# Patient Record
Sex: Male | Born: 1965 | Race: Black or African American | Hispanic: No | Marital: Single | Smoking: Never smoker
Health system: Southern US, Community
[De-identification: ages and names within clinical notes are randomized; demographics above are authoritative.]

## PROBLEM LIST (undated history)

## (undated) DIAGNOSIS — K219 Gastro-esophageal reflux disease without esophagitis: Secondary | ICD-10-CM

## (undated) DIAGNOSIS — E039 Hypothyroidism, unspecified: Secondary | ICD-10-CM

## (undated) DIAGNOSIS — S83209A Unspecified tear of unspecified meniscus, current injury, unspecified knee, initial encounter: Secondary | ICD-10-CM

## (undated) DIAGNOSIS — G4730 Sleep apnea, unspecified: Secondary | ICD-10-CM

## (undated) DIAGNOSIS — R609 Edema, unspecified: Secondary | ICD-10-CM

## (undated) DIAGNOSIS — M62838 Other muscle spasm: Secondary | ICD-10-CM

## (undated) DIAGNOSIS — R600 Localized edema: Secondary | ICD-10-CM

## (undated) DIAGNOSIS — E119 Type 2 diabetes mellitus without complications: Secondary | ICD-10-CM

## (undated) DIAGNOSIS — I499 Cardiac arrhythmia, unspecified: Secondary | ICD-10-CM

## (undated) DIAGNOSIS — H269 Unspecified cataract: Secondary | ICD-10-CM

## (undated) DIAGNOSIS — I10 Essential (primary) hypertension: Secondary | ICD-10-CM

## (undated) DIAGNOSIS — M1990 Unspecified osteoarthritis, unspecified site: Secondary | ICD-10-CM

## (undated) DIAGNOSIS — M722 Plantar fascial fibromatosis: Secondary | ICD-10-CM

## (undated) DIAGNOSIS — R0600 Dyspnea, unspecified: Secondary | ICD-10-CM

## (undated) DIAGNOSIS — I509 Heart failure, unspecified: Secondary | ICD-10-CM

## (undated) DIAGNOSIS — I5030 Unspecified diastolic (congestive) heart failure: Secondary | ICD-10-CM

## (undated) HISTORY — DX: Unspecified cataract: H26.9

## (undated) HISTORY — DX: Sleep apnea, unspecified: G47.30

## (undated) HISTORY — PX: OTHER SURGICAL HISTORY: SHX169

## (undated) HISTORY — PX: ESOPHAGOGASTRODUODENOSCOPY: SHX1529

## (undated) HISTORY — DX: Unspecified tear of unspecified meniscus, current injury, unspecified knee, initial encounter: S83.209A

## (undated) HISTORY — DX: Heart failure, unspecified: I50.9

---

## 2001-12-02 HISTORY — PX: OTHER SURGICAL HISTORY: SHX169

## 2012-09-22 ENCOUNTER — Other Ambulatory Visit (HOSPITAL_COMMUNITY)
Admission: RE | Admit: 2012-09-22 | Discharge: 2012-09-22 | Disposition: A | Discharge status: Home / Self Care | Payer: BC Managed Care – PPO | Source: Ambulatory Visit | Attending: Otolaryngology | Admitting: Otolaryngology

## 2012-09-22 DIAGNOSIS — H938X9 Other specified disorders of ear, unspecified ear: Secondary | ICD-10-CM | POA: Insufficient documentation

## 2012-09-23 ENCOUNTER — Other Ambulatory Visit: Payer: Self-pay | Admitting: Otolaryngology

## 2012-09-23 DIAGNOSIS — R1310 Dysphagia, unspecified: Secondary | ICD-10-CM

## 2012-09-23 DIAGNOSIS — K219 Gastro-esophageal reflux disease without esophagitis: Secondary | ICD-10-CM

## 2013-04-06 ENCOUNTER — Ambulatory Visit (INDEPENDENT_AMBULATORY_CARE_PROVIDER_SITE_OTHER): Payer: BC Managed Care – PPO | Admitting: Podiatry

## 2013-04-06 ENCOUNTER — Encounter: Payer: Self-pay | Admitting: Podiatry

## 2013-04-06 VITALS — BP 171/100 | HR 95 | Ht 71.0 in | Wt 251.0 lb

## 2013-04-06 DIAGNOSIS — M216X9 Other acquired deformities of unspecified foot: Secondary | ICD-10-CM

## 2013-04-06 DIAGNOSIS — M722 Plantar fascial fibromatosis: Secondary | ICD-10-CM

## 2013-04-06 DIAGNOSIS — M21969 Unspecified acquired deformity of unspecified lower leg: Secondary | ICD-10-CM

## 2013-04-06 DIAGNOSIS — M216X1 Other acquired deformities of right foot: Secondary | ICD-10-CM

## 2013-04-06 MED ORDER — HYDROCODONE-ACETAMINOPHEN 7.5-300 MG PO TABS: 1.0000 | ORAL_TABLET | Freq: Three times a day (TID) | ORAL | Status: DC | PRN

## 2013-04-06 MED ORDER — NABUMETONE 500 MG PO TABS: 500.0000 mg | ORAL_TABLET | Freq: Two times a day (BID) | ORAL | Status: DC

## 2013-04-06 NOTE — Progress Notes (Signed)
SUBJECTIVE: 47 y.o. year old male presents complaining of Right heel pain x 2 weeks. Hurts the most especially in AM or after been off, and getting up. Has tried Ibuprofen that did not help. Has tried OTC Orthotics that is helping.  REVIEW OF SYSTEMS: Constitutional: negative for anorexia, chills, fatigue, fevers, night sweats and weight loss Eyes: negative Ears, nose, mouth, throat, and face: negative Respiratory: negative Cardiovascular: negative Gastrointestinal: negative Genitourinary:negative Hematologic/lymphatic: negative Musculoskeletal:negative, Left knee arthroscopic surgery 10 years ago and still have swelling in lower limb. Had hairline fracture on left ankle.  Neurological: negative Allergic/Immunologic: Allergic to Pollens and sinus problem.  OBJECTIVE: DERMATOLOGIC EXAMINATION: Within normal. VASCULAR EXAMINATION OF LOWER LIMBS: Pedal pulses: All pedal pulses are palpable with normal pulsation.  Capillary Filling times within 3 seconds in all digits.  No visible edema at affected area. Mild increase in temperature on right rearfoot area.   NEUROLOGIC EXAMINATION OF THE LOWER LIMBS: All epicritic and tactile sensations grossly intact.   MUSCULOSKELETAL EXAMINATION: Tight Achilles tendon right with knee extension and flexion, normal finding on left side. Forefoot varus bilaereal.   RADIOGRAPHIC STUDIES:  Significant for short first metatarsal bone bilateral. Positive of increased lateral deviation calcaneocuboid angle. Positive of bone spur at the head of Talus just distal to ankle joint right foot.  ASSESSMENT: 1. Plantar fasciitis right. 2. Ankle equinus right. 3. Short first ray bilateral.  PLAN: Offered Night Splint and Cortisone injections; patient refused both. Should benefit from custom made Orthotics. Patient will return after finding out insurance benefits. Patient was instructed Achilles tendon stretch exercise.  Rx. Relafen 500 bid and Vicodin  7.5/300 q 8 hr prn.

## 2013-06-23 ENCOUNTER — Other Ambulatory Visit (HOSPITAL_COMMUNITY): Payer: Self-pay | Admitting: Otolaryngology

## 2013-07-29 ENCOUNTER — Encounter (HOSPITAL_COMMUNITY)
Admission: RE | Admit: 2013-07-29 | Discharge: 2013-07-29 | Disposition: A | Discharge status: Home / Self Care | Payer: BC Managed Care – PPO | Source: Ambulatory Visit | Attending: Otolaryngology | Admitting: Otolaryngology

## 2013-07-29 ENCOUNTER — Encounter (HOSPITAL_COMMUNITY): Payer: Self-pay

## 2013-07-29 ENCOUNTER — Encounter (HOSPITAL_COMMUNITY)
Admission: RE | Admit: 2013-07-29 | Discharge: 2013-07-29 | Disposition: A | Discharge status: Home / Self Care | Payer: BC Managed Care – PPO | Source: Ambulatory Visit | Attending: Anesthesiology | Admitting: Anesthesiology

## 2013-07-29 DIAGNOSIS — Z01812 Encounter for preprocedural laboratory examination: Secondary | ICD-10-CM | POA: Insufficient documentation

## 2013-07-29 DIAGNOSIS — Z01818 Encounter for other preprocedural examination: Secondary | ICD-10-CM | POA: Insufficient documentation

## 2013-07-29 HISTORY — DX: Other muscle spasm: M62.838

## 2013-07-29 HISTORY — DX: Edema, unspecified: R60.9

## 2013-07-29 HISTORY — DX: Essential (primary) hypertension: I10

## 2013-07-29 HISTORY — DX: Gastro-esophageal reflux disease without esophagitis: K21.9

## 2013-07-29 HISTORY — DX: Plantar fascial fibromatosis: M72.2

## 2013-07-29 LAB — CBC
HCT: 47.1 % (ref 39.0–52.0)
MCV: 74.1 fL — ABNORMAL LOW (ref 78.0–100.0)
RBC: 6.36 MIL/uL — ABNORMAL HIGH (ref 4.22–5.81)
WBC: 7.9 10*3/uL (ref 4.0–10.5)

## 2013-07-29 LAB — BASIC METABOLIC PANEL
CO2: 26 mEq/L (ref 19–32)
Chloride: 101 mEq/L (ref 96–112)
Creatinine, Ser: 1 mg/dL (ref 0.50–1.35)
Sodium: 140 mEq/L (ref 135–145)

## 2013-07-29 NOTE — Progress Notes (Signed)
Pt doesn't have a cardiologist  Echo/Stress test done at [facility] request   Medical Md is Dr.Mark Arvind  EKG to be requested from [facility]  Denies CXR in past yr

## 2013-07-29 NOTE — Pre-Procedure Instructions (Signed)
Parker [name]  07/29/2013   Your procedure is scheduled on:  Fri, Sept 5 @ 7:30 AM  Report to [facility] at 5:30 AM.  Call this number if you have problems the morning of surgery: [phone]   Remember:   Do not eat food or drink liquids after midnight.   Take these medicines the morning of surgery with A SIP OF WATER: Pain Pill(if needed)                Stop taking your Relafen.No Goody's,BC's,Aleve,Ibuprofen,Aspirin,Fish Oil,or any Herbal Medications   Do not wear jewelry  Do not wear lotions, powders, or colognes. You may wear deodorant.  Men may shave face and neck.  Do not bring valuables to the hospital.  [facility] is not responsible                   for any belongings or valuables.  Contacts, dentures or bridgework may not be worn into surgery.  Leave suitcase in the car. After surgery it may be brought to your room.  For patients admitted to the hospital, checkout time is 11:00 AM the day of  discharge.   Patients discharged the day of surgery will not be allowed to drive  home.    Special Instructions: Shower using CHG 2 nights before surgery and the night before surgery.  If you shower the day of surgery use CHG.  Use special wash - you have one bottle of CHG for all showers.  You should use approximately 1/3 of the bottle for each shower.   Please read over the following fact sheets that you were given: Pain Booklet, Coughing and Deep Breathing and Surgical Site Infection Prevention

## 2013-07-29 NOTE — Progress Notes (Signed)
07/29/13 1533  OBSTRUCTIVE SLEEP APNEA  Have you ever been diagnosed with sleep apnea through a sleep study? No  Do you snore loudly (loud enough to be heard through closed doors)?  0  Do you often feel tired, fatigued, or sleepy during the daytime? 0  Has anyone observed you stop breathing during your sleep? 1  Do you have, or are you being treated for high blood pressure? 1  BMI more than 35 kg/m2? 0  Age over 48 years old? 0  Neck circumference greater than 40 cm/18 inches? 1  Gender: 1 (18)  Obstructive Sleep Apnea Score 4  Score 4 or greater  Results sent to PCP

## 2013-08-03 NOTE — Progress Notes (Signed)
Spoke with medical records at [facility] center and re-requested EKG, OV note, Echo and stress test.  They stated they will fax today.

## 2013-08-06 ENCOUNTER — Encounter (HOSPITAL_COMMUNITY)
Admission: RE | Disposition: A | Discharge status: Home / Self Care | Payer: Self-pay | Source: Ambulatory Visit | Attending: Otolaryngology

## 2013-08-06 ENCOUNTER — Observation Stay (HOSPITAL_COMMUNITY)
Admission: RE | Admit: 2013-08-06 | Discharge: 2013-08-07 | Disposition: A | Discharge status: Home / Self Care | Payer: BC Managed Care – PPO | Source: Ambulatory Visit | Attending: Otolaryngology | Admitting: Otolaryngology

## 2013-08-06 ENCOUNTER — Encounter (HOSPITAL_COMMUNITY): Payer: Self-pay | Admitting: Anesthesiology

## 2013-08-06 ENCOUNTER — Encounter (HOSPITAL_COMMUNITY): Payer: Self-pay | Admitting: *Deleted

## 2013-08-06 ENCOUNTER — Ambulatory Visit (HOSPITAL_COMMUNITY): Payer: BC Managed Care – PPO | Admitting: Anesthesiology

## 2013-08-06 ENCOUNTER — Encounter (HOSPITAL_COMMUNITY): Payer: Self-pay | Admitting: Pharmacy Technician

## 2013-08-06 DIAGNOSIS — I10 Essential (primary) hypertension: Secondary | ICD-10-CM | POA: Insufficient documentation

## 2013-08-06 DIAGNOSIS — D234 Other benign neoplasm of skin of scalp and neck: Secondary | ICD-10-CM | POA: Insufficient documentation

## 2013-08-06 DIAGNOSIS — J3501 Chronic tonsillitis: Principal | ICD-10-CM | POA: Insufficient documentation

## 2013-08-06 DIAGNOSIS — D119 Benign neoplasm of major salivary gland, unspecified: Secondary | ICD-10-CM | POA: Insufficient documentation

## 2013-08-06 HISTORY — PX: EAR CYST EXCISION: SHX22

## 2013-08-06 HISTORY — PX: TONSILLECTOMY: SHX5217

## 2013-08-06 HISTORY — PX: PAROTIDECTOMY: SHX2163

## 2013-08-06 LAB — COMPREHENSIVE METABOLIC PANEL
AST: 70 U/L — ABNORMAL HIGH (ref 0–37)
Albumin: 3.6 g/dL (ref 3.5–5.2)
BUN: 14 mg/dL (ref 6–23)
Chloride: 104 mEq/L (ref 96–112)
Creatinine, Ser: 1.09 mg/dL (ref 0.50–1.35)
Total Bilirubin: 0.4 mg/dL (ref 0.3–1.2)
Total Protein: 6.4 g/dL (ref 6.0–8.3)

## 2013-08-06 LAB — CBC
HCT: 42.4 % (ref 39.0–52.0)
Hemoglobin: 14.5 g/dL (ref 13.0–17.0)
MCHC: 34.2 g/dL (ref 30.0–36.0)
RBC: 5.7 MIL/uL (ref 4.22–5.81)
WBC: 8.5 10*3/uL (ref 4.0–10.5)

## 2013-08-06 LAB — MAGNESIUM: Magnesium: 1.6 mg/dL (ref 1.5–2.5)

## 2013-08-06 LAB — PHOSPHORUS: Phosphorus: 3.6 mg/dL (ref 2.3–4.6)

## 2013-08-06 SURGERY — EXCISION, PAROTID GLAND
Anesthesia: General | Site: Neck | Laterality: Right | Wound class: Clean

## 2013-08-06 MED ORDER — CEFAZOLIN SODIUM-DEXTROSE 2-3 GM-% IV SOLR
2.0000 g | Freq: Two times a day (BID) | INTRAVENOUS | Status: DC
  Administered 2013-08-06: 2 g via INTRAVENOUS
  Filled 2013-08-06 (×4): qty 50

## 2013-08-06 MED ORDER — DEXAMETHASONE SODIUM PHOSPHATE 10 MG/ML IJ SOLN
10.0000 mg | Freq: Once | INTRAMUSCULAR | Status: AC
  Administered 2013-08-07: 10 mg via INTRAVENOUS

## 2013-08-06 MED ORDER — LACTATED RINGERS IV SOLN
INTRAVENOUS | Status: DC | PRN
  Administered 2013-08-06 (×4): via INTRAVENOUS

## 2013-08-06 MED ORDER — ARTIFICIAL TEARS OP OINT
TOPICAL_OINTMENT | OPHTHALMIC | Status: DC | PRN
  Administered 2013-08-06: 1 via OPHTHALMIC

## 2013-08-06 MED ORDER — METOPROLOL TARTRATE 1 MG/ML IV SOLN
INTRAVENOUS | Status: DC | PRN
  Administered 2013-08-06: 1 mg via INTRAVENOUS
  Administered 2013-08-06 (×2): 2 mg via INTRAVENOUS

## 2013-08-06 MED ORDER — BACITRACIN ZINC 500 UNIT/GM EX OINT
TOPICAL_OINTMENT | CUTANEOUS | Status: AC
  Filled 2013-08-06: qty 15

## 2013-08-06 MED ORDER — ADULT MULTIVITAMIN W/MINERALS CH
1.0000 | ORAL_TABLET | Freq: Every day | ORAL | Status: DC
  Filled 2013-08-06: qty 1

## 2013-08-06 MED ORDER — FENTANYL CITRATE 0.05 MG/ML IJ SOLN
INTRAMUSCULAR | Status: DC | PRN
  Administered 2013-08-06 (×3): 100 ug via INTRAVENOUS
  Administered 2013-08-06 (×4): 50 ug via INTRAVENOUS

## 2013-08-06 MED ORDER — LIDOCAINE-EPINEPHRINE 1 %-1:100000 IJ SOLN
INTRAMUSCULAR | Status: AC
  Filled 2013-08-06: qty 1

## 2013-08-06 MED ORDER — MIDAZOLAM HCL 2 MG/2ML IJ SOLN: 1.0000 mg | INTRAMUSCULAR | Status: DC | PRN

## 2013-08-06 MED ORDER — PHENYLEPHRINE HCL 10 MG/ML IJ SOLN
INTRAMUSCULAR | Status: DC | PRN
  Administered 2013-08-06 (×5): 120 ug via INTRAVENOUS
  Administered 2013-08-06: 80 ug via INTRAVENOUS
  Administered 2013-08-06: 120 ug via INTRAVENOUS

## 2013-08-06 MED ORDER — MIDAZOLAM HCL 5 MG/5ML IJ SOLN
INTRAMUSCULAR | Status: DC | PRN
  Administered 2013-08-06: 2 mg via INTRAVENOUS

## 2013-08-06 MED ORDER — GLYCOPYRROLATE 0.2 MG/ML IJ SOLN
INTRAMUSCULAR | Status: DC | PRN
  Administered 2013-08-06 (×2): 0.1 mg via INTRAVENOUS

## 2013-08-06 MED ORDER — ONDANSETRON HCL 4 MG/2ML IJ SOLN: 4.0000 mg | INTRAMUSCULAR | Status: DC | PRN

## 2013-08-06 MED ORDER — LISINOPRIL 20 MG PO TABS
20.0000 mg | ORAL_TABLET | Freq: Every day | ORAL | Status: DC
  Administered 2013-08-06: 20 mg via ORAL
  Filled 2013-08-06 (×2): qty 1

## 2013-08-06 MED ORDER — HYDROMORPHONE HCL PF 1 MG/ML IJ SOLN
0.2500 mg | INTRAMUSCULAR | Status: DC | PRN
  Administered 2013-08-06 (×2): 0.25 mg via INTRAVENOUS

## 2013-08-06 MED ORDER — ONDANSETRON HCL 4 MG/2ML IJ SOLN
INTRAMUSCULAR | Status: DC | PRN
  Administered 2013-08-06: 4 mg via INTRAVENOUS

## 2013-08-06 MED ORDER — LIDOCAINE HCL (CARDIAC) 20 MG/ML IV SOLN
INTRAVENOUS | Status: DC | PRN
  Administered 2013-08-06: 100 mg via INTRAVENOUS

## 2013-08-06 MED ORDER — FENTANYL CITRATE 0.05 MG/ML IJ SOLN: 50.0000 ug | Freq: Once | INTRAMUSCULAR | Status: DC

## 2013-08-06 MED ORDER — PROMETHAZINE HCL 25 MG/ML IJ SOLN: 6.2500 mg | INTRAMUSCULAR | Status: DC | PRN

## 2013-08-06 MED ORDER — DOCUSATE SODIUM 100 MG PO CAPS: 100.0000 mg | ORAL_CAPSULE | Freq: Two times a day (BID) | ORAL | Status: DC | PRN

## 2013-08-06 MED ORDER — PHENYLEPHRINE HCL 10 MG/ML IJ SOLN
10.0000 mg | INTRAVENOUS | Status: DC | PRN
  Administered 2013-08-06: 11:00:00 via INTRAVENOUS
  Administered 2013-08-06: 100 ug/min via INTRAVENOUS

## 2013-08-06 MED ORDER — OXYMETAZOLINE HCL 0.05 % NA SOLN
NASAL | Status: AC
  Filled 2013-08-06: qty 15

## 2013-08-06 MED ORDER — PROPOFOL 10 MG/ML IV BOLUS
INTRAVENOUS | Status: DC | PRN
  Administered 2013-08-06: 50 mg via INTRAVENOUS
  Administered 2013-08-06: 300 mg via INTRAVENOUS
  Administered 2013-08-06: 50 mg via INTRAVENOUS

## 2013-08-06 MED ORDER — KCL IN DEXTROSE-NACL 10-5-0.45 MEQ/L-%-% IV SOLN
INTRAVENOUS | Status: DC
  Administered 2013-08-06: 17:00:00 via INTRAVENOUS
  Filled 2013-08-06 (×4): qty 1000

## 2013-08-06 MED ORDER — OXYMETAZOLINE HCL 0.05 % NA SOLN
NASAL | Status: DC | PRN
  Administered 2013-08-06: 1 via NASAL

## 2013-08-06 MED ORDER — PROMETHAZINE HCL 25 MG/ML IJ SOLN: 25.0000 mg | INTRAMUSCULAR | Status: DC | PRN

## 2013-08-06 MED ORDER — CEFAZOLIN SODIUM-DEXTROSE 2-3 GM-% IV SOLR
2.0000 g | Freq: Once | INTRAVENOUS | Status: AC
  Administered 2013-08-06 (×2): 2 g via INTRAVENOUS

## 2013-08-06 MED ORDER — LIDOCAINE HCL 4 % MT SOLN
OROMUCOSAL | Status: DC | PRN
  Administered 2013-08-06: 4 mL via TOPICAL

## 2013-08-06 MED ORDER — HYDROCHLOROTHIAZIDE 12.5 MG PO CAPS
12.5000 mg | ORAL_CAPSULE | Freq: Every day | ORAL | Status: DC
  Administered 2013-08-06: 12.5 mg via ORAL
  Filled 2013-08-06 (×2): qty 1

## 2013-08-06 MED ORDER — DEXAMETHASONE SODIUM PHOSPHATE 4 MG/ML IJ SOLN
INTRAMUSCULAR | Status: DC | PRN
  Administered 2013-08-06: 10 mg via INTRAVENOUS

## 2013-08-06 MED ORDER — ACETAMINOPHEN 160 MG/5ML PO SOLN: 650.0000 mg | ORAL | Status: DC | PRN

## 2013-08-06 MED ORDER — ALBUMIN HUMAN 5 % IV SOLN
INTRAVENOUS | Status: DC | PRN
  Administered 2013-08-06 (×2): via INTRAVENOUS

## 2013-08-06 MED ORDER — BACITRACIN ZINC 500 UNIT/GM EX OINT
TOPICAL_OINTMENT | Freq: Two times a day (BID) | CUTANEOUS | Status: DC
  Administered 2013-08-06: 15.5556 via TOPICAL
  Administered 2013-08-07: 10:00:00 via TOPICAL
  Filled 2013-08-06: qty 15
  Filled 2013-08-06: qty 28.35

## 2013-08-06 MED ORDER — MORPHINE SULFATE 2 MG/ML IJ SOLN
2.0000 mg | INTRAMUSCULAR | Status: DC | PRN
  Administered 2013-08-06 – 2013-08-07 (×4): 2 mg via INTRAVENOUS
  Filled 2013-08-06 (×4): qty 1

## 2013-08-06 MED ORDER — LISINOPRIL-HYDROCHLOROTHIAZIDE 20-12.5 MG PO TABS: 1.0000 | ORAL_TABLET | Freq: Every day | ORAL | Status: DC

## 2013-08-06 MED ORDER — NABUMETONE 500 MG PO TABS
500.0000 mg | ORAL_TABLET | Freq: Two times a day (BID) | ORAL | Status: DC
Start: 2013-08-06 — End: 2013-08-07
  Filled 2013-08-06 (×4): qty 1

## 2013-08-06 MED ORDER — HYDROMORPHONE HCL PF 1 MG/ML IJ SOLN
INTRAMUSCULAR | Status: AC
  Filled 2013-08-06: qty 1

## 2013-08-06 MED ORDER — OXYCODONE HCL 5 MG/5ML PO SOLN: 5.0000 mg | Freq: Once | ORAL | Status: DC | PRN

## 2013-08-06 MED ORDER — BACITRACIN-POLYMYXIN B EX OINT
TOPICAL_OINTMENT | CUTANEOUS | Status: DC | PRN
  Administered 2013-08-06: 1 via TOPICAL

## 2013-08-06 MED ORDER — LIDOCAINE-EPINEPHRINE 1 %-1:100000 IJ SOLN
INTRAMUSCULAR | Status: DC | PRN
  Administered 2013-08-06 (×2): 20 mL

## 2013-08-06 MED ORDER — SUCCINYLCHOLINE CHLORIDE 20 MG/ML IJ SOLN
INTRAMUSCULAR | Status: DC | PRN
  Administered 2013-08-06: 160 mg via INTRAVENOUS

## 2013-08-06 MED ORDER — MORPHINE SULFATE 10 MG/ML IJ SOLN
INTRAMUSCULAR | Status: DC | PRN
  Administered 2013-08-06: 2 mg via INTRAVENOUS
  Administered 2013-08-06: 3 mg via INTRAVENOUS

## 2013-08-06 MED ORDER — HYDRALAZINE HCL 20 MG/ML IJ SOLN: 10.0000 mg | Freq: Three times a day (TID) | INTRAMUSCULAR | Status: DC | PRN

## 2013-08-06 MED ORDER — OXYCODONE HCL 5 MG PO TABS: 5.0000 mg | ORAL_TABLET | Freq: Once | ORAL | Status: DC | PRN

## 2013-08-06 MED ORDER — FAMOTIDINE IN NACL 20-0.9 MG/50ML-% IV SOLN
20.0000 mg | Freq: Two times a day (BID) | INTRAVENOUS | Status: DC
  Administered 2013-08-06 (×2): 20 mg via INTRAVENOUS
  Filled 2013-08-06 (×5): qty 50

## 2013-08-06 MED ORDER — CEFAZOLIN SODIUM-DEXTROSE 2-3 GM-% IV SOLR
INTRAVENOUS | Status: AC
  Filled 2013-08-06: qty 50

## 2013-08-06 MED ORDER — OXYCODONE HCL 5 MG/5ML PO SOLN
10.0000 mg | ORAL | Status: DC | PRN
  Administered 2013-08-06 – 2013-08-07 (×3): 10 mg via ORAL
  Filled 2013-08-06: qty 10
  Filled 2013-08-06 (×2): qty 5
  Filled 2013-08-06: qty 10

## 2013-08-06 MED ORDER — HEMOSTATIC AGENTS (NO CHARGE) OPTIME
TOPICAL | Status: DC | PRN
  Administered 2013-08-06: 1 via TOPICAL

## 2013-08-06 SURGICAL SUPPLY — 70 items
ATTRACTOMAT 16X20 MAGNETIC DRP (DRAPES) IMPLANT
BLADE SURG 12 STRL SS (BLADE) IMPLANT
BLADE SURG 15 STRL LF DISP TIS (BLADE) ×6 IMPLANT
BLADE SURG 15 STRL SS (BLADE) ×2
CANISTER SUCTION 2500CC (MISCELLANEOUS) ×4 IMPLANT
CATH ROBINSON RED A/P 10FR (CATHETERS) IMPLANT
CLEANER TIP ELECTROSURG 2X2 (MISCELLANEOUS) ×4 IMPLANT
CLOTH BEACON ORANGE TIMEOUT ST (SAFETY) ×4 IMPLANT
COAGULATOR SUCT SWTCH 10FR 6 (ELECTROSURGICAL) ×4 IMPLANT
CONT SPEC 4OZ CLIKSEAL STRL BL (MISCELLANEOUS) ×4 IMPLANT
CORDS BIPOLAR (ELECTRODE) ×4 IMPLANT
COVER SURGICAL LIGHT HANDLE (MISCELLANEOUS) ×4 IMPLANT
DRAIN JACKSON PRATT 10MM FLAT (MISCELLANEOUS) ×4 IMPLANT
DRAIN JACKSON RD 7FR 3/32 (WOUND CARE) IMPLANT
DRAPE INCISE 13X13 STRL (DRAPES) ×4 IMPLANT
ELECT COATED BLADE 2.86 ST (ELECTRODE) ×8 IMPLANT
ELECT REM PT RETURN 9FT ADLT (ELECTROSURGICAL)
ELECT REM PT RETURN 9FT PED (ELECTROSURGICAL)
ELECTRODE REM PT RETRN 9FT PED (ELECTROSURGICAL) IMPLANT
ELECTRODE REM PT RTRN 9FT ADLT (ELECTROSURGICAL) IMPLANT
EVACUATOR SILICONE 100CC (DRAIN) ×4 IMPLANT
GAUZE SPONGE 4X4 16PLY XRAY LF (GAUZE/BANDAGES/DRESSINGS) ×8 IMPLANT
GLOVE BIO SURGEON STRL SZ 6.5 (GLOVE) ×4 IMPLANT
GLOVE BIOGEL PI IND STRL 6.5 (GLOVE) ×3 IMPLANT
GLOVE BIOGEL PI IND STRL 7.0 (GLOVE) ×3 IMPLANT
GLOVE BIOGEL PI INDICATOR 6.5 (GLOVE) ×1
GLOVE BIOGEL PI INDICATOR 7.0 (GLOVE) ×1
GLOVE ECLIPSE 6.5 STRL STRAW (GLOVE) ×4 IMPLANT
GLOVE SURG SS PI 7.0 STRL IVOR (GLOVE) ×12 IMPLANT
GLOVE SURG SS PI 7.5 STRL IVOR (GLOVE) ×4 IMPLANT
GOWN STRL NON-REIN LRG LVL3 (GOWN DISPOSABLE) ×20 IMPLANT
HEMOSTAT SURGICEL 2X14 (HEMOSTASIS) ×4 IMPLANT
KIT BASIN OR (CUSTOM PROCEDURE TRAY) ×4 IMPLANT
KIT ROOM TURNOVER OR (KITS) ×4 IMPLANT
NEEDLE HYPO 25GX1X1/2 BEV (NEEDLE) ×4 IMPLANT
NS IRRIG 1000ML POUR BTL (IV SOLUTION) ×4 IMPLANT
PACK SURGICAL SETUP 50X90 (CUSTOM PROCEDURE TRAY) ×4 IMPLANT
PAD ARMBOARD 7.5X6 YLW CONV (MISCELLANEOUS) ×8 IMPLANT
PENCIL BUTTON HOLSTER BLD 10FT (ELECTRODE) ×8 IMPLANT
PIN SAFETY STERILE (MISCELLANEOUS) ×8 IMPLANT
SHEARS HARMONIC 9CM CVD (BLADE) ×8 IMPLANT
SOLUTION BETADINE 4OZ (MISCELLANEOUS) ×4 IMPLANT
SPECIMEN JAR SMALL (MISCELLANEOUS) ×12 IMPLANT
SPONGE GAUZE 4X4 12PLY (GAUZE/BANDAGES/DRESSINGS) ×4 IMPLANT
SPONGE INTESTINAL PEANUT (DISPOSABLE) ×4 IMPLANT
SPONGE TONSIL 1 RF SGL (DISPOSABLE) IMPLANT
STAPLER VISISTAT 35W (STAPLE) ×4 IMPLANT
SUT CHROMIC 2 0 SH (SUTURE) ×4 IMPLANT
SUT CHROMIC 3 0 SH 27 (SUTURE) ×12 IMPLANT
SUT CHROMIC 4 0 SH 27 (SUTURE) ×8 IMPLANT
SUT ETHILON 3 0 PS 1 (SUTURE) ×4 IMPLANT
SUT PROLENE 5 0 PS 2 (SUTURE) ×4 IMPLANT
SUT SILK 2 0 (SUTURE) ×1
SUT SILK 2 0 FS (SUTURE) ×20 IMPLANT
SUT SILK 2-0 18XBRD TIE 12 (SUTURE) ×3 IMPLANT
SUT VIC AB 3-0 SH 18 (SUTURE) ×4 IMPLANT
SUT VIC AB 3-0 SH 27 (SUTURE) ×3
SUT VIC AB 3-0 SH 27X BRD (SUTURE) ×9 IMPLANT
SUT VICRYL 3 0 8 18 UR 6 D8304 (SUTURE) IMPLANT
SUT VICRYL 4 0 EN S 48 D7796 (SUTURE) IMPLANT
SYR BULB 3OZ (MISCELLANEOUS) ×4 IMPLANT
SYR CONTROL 10ML LL (SYRINGE) ×4 IMPLANT
SYRINGE 10CC LL (SYRINGE) ×4 IMPLANT
TOWEL OR 17X24 6PK STRL BLUE (TOWEL DISPOSABLE) ×12 IMPLANT
TRAY ENT MC OR (CUSTOM PROCEDURE TRAY) ×4 IMPLANT
TRAY FOLEY CATH 14FRSI W/METER (CATHETERS) IMPLANT
TUBE CONNECTING 12X1/4 (SUCTIONS) ×4 IMPLANT
TUBE SALEM SUMP 12R W/ARV (TUBING) IMPLANT
WATER STERILE IRR 1000ML POUR (IV SOLUTION) ×4 IMPLANT
YANKAUER SUCT BULB TIP NO VENT (SUCTIONS) ×4 IMPLANT

## 2013-08-06 NOTE — Anesthesia Procedure Notes (Signed)
Procedure Name: Intubation Date/Time: 08/06/2013 7:44 AM Performed by: Leona Singleton A Pre-anesthesia Checklist: Patient identified, Emergency Drugs available, Suction available and Patient being monitored Patient Re-evaluated:Patient Re-evaluated prior to inductionOxygen Delivery Method: Circle system utilized Preoxygenation: Pre-oxygenation with 100% oxygen Intubation Type: IV induction Ventilation: Mask ventilation without difficulty and Oral airway inserted - appropriate to patient size Laryngoscope Size: Miller and 3 Grade View: Grade III Tube type: Oral Tube size: 7.5 mm Number of attempts: 2 Airway Equipment and Method: Stylet,  LTA kit utilized and Video-laryngoscopy Placement Confirmation: ETT inserted through vocal cords under direct vision,  positive ETCO2 and breath sounds checked- equal and bilateral Secured at: 23 cm Tube secured with: Tape Dental Injury: Teeth and Oropharynx as per pre-operative assessment  Difficulty Due To: Difficulty was anticipated, Difficult Airway- due to large tongue and Difficult Airway- due to anterior larynx Future Recommendations: Recommend- induction with short-acting agent, and alternative techniques readily available Comments: EZ mask with OA. DL x1; Mil 3, grade 3 view. Unsuccessful intubation attempt. VSS. DL x1; Glidescope. ETT pass with ease through glottic opening. AOI. Dec BP tx with Neo.

## 2013-08-06 NOTE — Anesthesia Preprocedure Evaluation (Addendum)
Anesthesia Evaluation  Patient identified by MRN, date of birth, ID band Patient awake    Reviewed: Allergy & Precautions, H&P , NPO status , Patient's Chart, lab work & pertinent test results  History of Anesthesia Complications Negative for: history of anesthetic complications  Airway Mallampati: II TM Distance: >3 FB Neck ROM: Full    Dental  (+) Teeth Intact and Dental Advisory Given   Pulmonary neg pulmonary ROS,          Cardiovascular hypertension, Pt. on medications Rhythm:Regular Rate:Normal     Neuro/Psych negative neurological ROS  negative psych ROS   GI/Hepatic Neg liver ROS, GERD-  Controlled,  Endo/Other  Neoplasm of parotid gland  Renal/GU negative Renal ROS  negative genitourinary   Musculoskeletal negative musculoskeletal ROS (+)   Abdominal (+) + obese,   Peds  Hematology negative hematology ROS (+)   Anesthesia Other Findings   Reproductive/Obstetrics negative OB ROS                         Anesthesia Physical Anesthesia Plan  ASA: II  Anesthesia Plan: General   Post-op Pain Management:    Induction: Intravenous  Airway Management Planned: Oral ETT  Additional Equipment:   Intra-op Plan:   Post-operative Plan: Extubation in OR  Informed Consent: I have reviewed the patients History and Physical, chart, labs and discussed the procedure including the risks, benefits and alternatives for the proposed anesthesia with the patient or authorized representative who has indicated his/her understanding and acceptance.     Plan Discussed with: CRNA and Surgeon  Anesthesia Plan Comments:         Anesthesia Quick Evaluation

## 2013-08-06 NOTE — Preoperative (Signed)
Beta Blockers   Reason not to administer Beta Blockers:Not Applicable 

## 2013-08-06 NOTE — Transfer of Care (Signed)
Immediate Anesthesia Transfer of Care Note  Patient: Parker [name]  Procedure(s) Performed: Procedure(s): PAROTIDECTOMY (Right) TONSILLECTOMY (Bilateral) CYST REMOVAL (N/A)  Patient Location: PACU  Anesthesia Type:General  Level of Consciousness: awake, alert  and patient cooperative  Airway & Oxygen Therapy: Patient Spontanous Breathing and Patient connected to face mask oxygen  Post-op Assessment: Report given to PACU RN and Post -op Vital signs reviewed and stable  Post vital signs: Reviewed and stable  Complications: No apparent anesthesia complications

## 2013-08-06 NOTE — Progress Notes (Signed)
   ENT Progress Note:  s/p Procedure(s): PAROTIDECTOMY TONSILLECTOMY CYST REMOVAL   Subjective: C/O sore throat  Objective: Vital signs in last 24 hours: Temp:  [97.8 F (36.6 C)-99.4 F (37.4 C)] 98 F (36.7 C) (09/05 1538) Pulse Rate:  [87-111] 91 (09/05 1538) Resp:  [15-26] 18 (09/05 1538) BP: (133-192)/(72-117) 135/88 mmHg (09/05 1538) SpO2:  [84 %-98 %] 94 % (09/05 1538) Weight:  [112.038 kg (247 lb)] 112.038 kg (247 lb) (09/05 1605) Weight change:     Intake/Output from previous day:   Intake/Output this shift: Total I/O In: 4300 [P.O.:200; I.V.:3600; IV Piggyback:500] Out: 480 [Urine:365; Drains:15; Blood:100]  Labs:  Recent Labs  08/06/13 1335  WBC 8.5  HGB 14.5  HCT 42.4  PLT 203    Recent Labs  08/06/13 1335  NA 141  K 4.1  CL 104  CO2 22  GLUCOSE 142*  BUN 14  CALCIUM 8.4    Studies/Results: No results found.   PHYSICAL EXAM: Parotid inc intact, JP holding  Nl facial n function Op stable, no bleeding, airway stable   Assessment/Plan: Stable po Monitor o/n Cont po as tol    Delio Slates 08/06/2013, 6:33 PM

## 2013-08-06 NOTE — Progress Notes (Signed)
Subjective: S/p right superficial parotidectomy, tonsillectomy, left scalp lesion excision. Sleepy but arousable in PACU. Oral cavity hemostatic.  Objective: Vital signs in last 24 hours: Temp:  [97.8 F (36.6 C)-99.4 F (37.4 C)] 99.4 F (37.4 C) (09/05 1230) Pulse Rate:  [87-111] 92 (09/05 1500) Resp:  [15-26] 17 (09/05 1500) BP: (133-192)/(72-117) 144/76 mmHg (09/05 1500) SpO2:  [84 %-98 %] 92 % (09/05 1500)  Smile, eyebrow raise, eye closure all intact and symmetric bilaterally. Facial nerve is House-Brackmann 1/6 bilaterally in all divisions. Right neck incision clean dry and intact with JP holding bulb suction. Oral cavity with tonsils surgically absent and oral cavity hemostatic.  @LABLAST2 (wbc:2,hgb:2,hct:2,plt:2)  Recent Labs  08/06/13 1335  NA 141  K 4.1  CL 104  CO2 22  GLUCOSE 142*  BUN 14  CREATININE 1.09  CALCIUM 8.4    Medications:  Scheduled Meds: . bacitracin   Topical BID  .  ceFAZolin (ANCEF) IV  2 g Intravenous Q12H  . dexamethasone  10 mg Intravenous Once  . famotidine (PEPCID) IV  20 mg Intravenous Q12H  . fentaNYL  50-100 mcg Intravenous Once  . HYDROmorphone      . lisinopril-hydrochlorothiazide  1 tablet Oral Daily  . multivitamin with minerals  1 tablet Oral Daily  . nabumetone  500 mg Oral BID   Continuous Infusions: . dextrose 5 % and 0.45 % NaCl with KCl 10 mEq/L     PRN Meds:.acetaminophen (TYLENOL) oral liquid 160 mg/5 mL, docusate sodium, hydrALAZINE, HYDROmorphone (DILAUDID) injection, midazolam, morphine injection, ondansetron (ZOFRAN) IV, oxyCODONE, oxyCODONE, oxyCODONE, promethazine, promethazine  Assessment/Plan: Wean oxygen as needed to keep sats >90%, bacitracin to right neck and left scalp incisions, JP drain record and empty PRN. Advance to post-tonsillectomy diet as tolerated. Will monitor at least overnight.   LOS: 0 days   Melvenia Beam 08/06/2013, 3:06 PM

## 2013-08-06 NOTE — Anesthesia Postprocedure Evaluation (Signed)
  Anesthesia Post-op Note  Patient: Parker [name]  Procedure(s) Performed: Procedure(s): PAROTIDECTOMY (Right) TONSILLECTOMY (Bilateral) CYST REMOVAL (N/A)  Patient Location: PACU  Anesthesia Type:General  Level of Consciousness: awake  Airway and Oxygen Therapy: Patient Spontanous Breathing and Patient connected to nasal cannula oxygen  Post-op Pain: mild  Post-op Assessment: Post-op Vital signs reviewed, Patient's Cardiovascular Status Stable, Respiratory Function Stable, Patent Airway and No signs of Nausea or vomiting  Post-op Vital Signs: Reviewed and stable  Complications: No apparent anesthesia complications

## 2013-08-06 NOTE — H&P (Signed)
08/06/2013  Parker [name]  PREOPERATIVE HISTORY AND PHYSICAL  CHIEF COMPLAINT: right parotid mass, left posterior scalp cyst, tonsillar hypertrophy  HISTORY: This is a 47 year old who presents with right parotid mass, left posterior scalp cyst, tonsillar hypertrophy.  He now presents for right parotidectomy, left scalp cyst excision, and bilateral tonsillectomy.  Dr. Emeline Darling, [place] has discussed the risks (facial nerve injury, facial paralysis, seroma, bleeding, infection, oral bleeding, etc.), benefits, and alternatives of this procedure. The patient understands the risks and would like to proceed with the procedure. The chances of success of the procedure are >50% and the patient understands this. I personally performed an examination of the patient within 24 hours of the procedure.  PAST MEDICAL HISTORY: Past Medical History  Diagnosis Date  . Hypertension     takes Lisinopril-HCTZ daily  . Muscle spasm     takes Relafen daily  . Plantar fasciitis     right  . Swelling     to left ankle  . GERD (gastroesophageal reflux disease)     takes Dexilant prn     PAST SURGICAL HISTORY: Past Surgical History  Procedure Laterality Date  . Left knee arthroscopy    . Esophagogastroduodenoscopy      MEDICATIONS: No current facility-administered medications on file prior to encounter.   Current Outpatient Prescriptions on File Prior to Encounter  Medication Sig Dispense Refill  . CIALIS 20 MG tablet Take 20 mg by mouth daily as needed for erectile dysfunction.       . Hydrocodone-Acetaminophen (VICODIN ES) 7.5-300 MG TABS Take 1 tablet by mouth 3 (three) times daily as needed.  60 each  0  . lisinopril-hydrochlorothiazide (PRINZIDE,ZESTORETIC) 20-12.5 MG per tablet Take 1 tablet by mouth daily.       . Multiple Vitamins-Minerals (MULTIVITAMIN WITH MINERALS) tablet Take 1 tablet by mouth daily.      . nabumetone (RELAFEN) 500 MG tablet Take 1 tablet (500 mg total) by mouth 2 (two) times  daily.  60 tablet  0    ALLERGIES: No Known Allergies  SOCIAL HISTORY: History   Social History  . Marital Status: Single    Spouse Name: N/A    Number of Children: N/A  . Years of Education: N/A   Occupational History  . Not on file.   Social History Main Topics  . Smoking status: Never Smoker   . Smokeless tobacco: Never Used  . Alcohol Use: Not on file     Comment: daily  . Drug Use: No  . Sexual Activity: Yes   Other Topics Concern  . Not on file   Social History Narrative  . No narrative on file    FAMILY HISTORY:History reviewed. No pertinent family history.  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 Patient understands the risks, benefits, and alternatives.  08/06/2013  7:24 AM Parker [name]

## 2013-08-06 NOTE — Op Note (Signed)
08/06/2013 Surgeon: Melvenia Beam 12:31 PM Assistant: Aquilla Hacker PA-C  Procedures: right superficial parotidectomy with facial nerve dissection and preservation 42415-Right Bilateral tonsillectomy >48yo 42826 Excision of left 1cm posterior skin lesion 11442-Left  Specimens: right superficial parotidectomy, right and left tonsil, left posterior scalp lesion  Preoperative Diagnosis: right parotid mass, tonsillar hypertrophy/snoring/chronic tonsillitis, left posterior scalp 1cm skin mass Postoperative Diagnosis:  right parotid mass, tonsillar hypertrophy/snoring/chronic tonsillitis, left posterior scalp 1cm skin mass   EBL: <235mL Anesthesia: General Via Endotracheal Complications: none Operative Findings: intact right facial nerve in all divisions, 3+ cryptic tonsils, left 1cm posterior scalp mass  Operative Details: The patient was laid supine and intubated by anesthesia. The right facial nerve monitor electrodes were placed, and the right face was prepped with betadine and draped. A standard modified Blair incision was made on the right side with the incision extending behind the earlobe and down slightly into the neck. Anterior and posterior supraplatysmal skin flaps were developed. The parotidomasseteric fascia overlying the parotid gland was left intact. Posteriorly, the mastoid process and the anterior edge of the sternocleidomastoid were identified. With blunt dissection, a small curved clamp was used to separate the gland from the mastoid process and the sternocleidomastoid muscle. The cartilage of the external auditory canal and the tragal pointer were identified. Bleeding was treated with bipolar cautery as needed. Using a mosquito clamp the temporoparotid fascia was carefully elevated and transsected. Then the main trunk of the nerve was visualized and identified coming out of the stylomastoid foramen and dissected out. The frontotemporal and zygomatic branches were dissected out  using the McCabe, Harmonic scalpel and bipolar. Next the buccal, marginal mandibular, and cervical branches were sequentially dissected out using the McCabe, harmonic scalpel,  and bipolar through the gland. The right retromandibular vein was identified and divided and tied off with 2-0 silks. The right tail or parotid/inferior right parotid mass was palpated and then the superficial lobe of the  gland with the right tail of parotid mass was removed. Following removal of the superficial lobe and the mass the facial nerve branches were tested with the nerve stimulator and noted to all be stimulating normally and intact. The nerves were anatomically/visually intact as well Once hemostasis was achieved wit hthe bipolar and sugicel was placed for hemostasis a flat JP suction drain was inserted into the depth of the wound and brought out through the skin inferiorly. Subcutaneous sutures of 4-0 Vicryl were used for closure of the muscles and subcutaneous layer. The skin was closed with interrupted simple 4-0 chromic gut sutures. Suction was applied to the drain.   The bed was turned 90 degrees and the Crowe-Davis mouth retractor was placed over the endotracheal tube and suspended from the Mayo stand. The palate was inspected and palpated and noted to be intact with no submucous cleft. The uvula was midline and normal. The adenoids were inspected with a dental mirror and noted to be nonobstructive. The tonsils were noted to be 3+ and cryptic.  Next the right tonsil was grasped with a curved Allis clamp and dissected from the right tonsillar fossa using the Bovie. Meticulous hemostasis was then achieved. The left tonsil was then grasped with the curved Allis and dissected from the left tonsillar fossa using the Bovie. Meticulous hemostasis was achieved. The nasal cavity and oropharynx were irrigated out and then the the nose, oral cavity,  and stomach were suctioned out.  The head was then tuned to the right and  the left posterior scalp  1cm cystic skin mass was then prepped and draped in the standard sterile fashion. It was injected with 1% lidocaine with epinephrine. I then made an elliptical incision around the mass and the removed it using the short sharp scissors and Bovie. I then undermined the skin as needed with the scissors. I then closed the skin with interrupted 2-0 chromic gut simple sutures and dressed the incision with bacitracin ointment.  The patient was turned back to anesthesia and awakened from anesthesia and extubated without difficulty. The patient tolerated the procedure well with no immediate complications and was taken to the postoperative recovery area in good condition.   Dr. Melvenia Beam was present and performed the entire procedure. 08/06/2013  12:29 PM Melvenia Beam

## 2013-08-07 MED ORDER — HYDROCODONE-ACETAMINOPHEN 7.5-325 MG/15ML PO SOLN: 15.0000 mL | ORAL | Status: DC | PRN

## 2013-08-07 NOTE — Discharge Summary (Signed)
Physician Discharge Summary  Patient ID: Parker [name] MRN: [id number] DOB/AGE: 1966/09/12 47 y.o.  Admit date: 08/06/2013 Discharge date: 08/07/2013  Admission Diagnoses:  Tonsil Hypertrophy Rt Parotid mass  Discharge Diagnoses:  Same  Surgeries: Procedure(s): PAROTIDECTOMY TONSILLECTOMY CYST REMOVAL on 08/06/2013   Consultants: none  Discharged Condition: Improved  Hospital Course: Parker [name] is an 47 y.o. male who was admitted 08/06/2013 with a diagnosis as outlined above and went to the operating room on 08/06/2013 and underwent the above named procedures.   Stable post op, Pt d/c'ed to home.  Recent vital signs:  Filed Vitals:   08/07/13 0604  BP: 144/86  Pulse: 92  Temp: 98.4 F (36.9 C)  Resp: 19    Recent laboratory studies:  Results for orders placed during the hospital encounter of 08/06/13  COMPREHENSIVE METABOLIC PANEL      Result Value Range   Sodium 141  135 - 145 mEq/L   Potassium 4.1  3.5 - 5.1 mEq/L   Chloride 104  96 - 112 mEq/L   CO2 22  19 - 32 mEq/L   Glucose, Bld 142 (*) 70 - 99 mg/dL   BUN 14  6 - 23 mg/dL   Creatinine, Ser 4.09  0.50 - 1.35 mg/dL   Calcium 8.4  8.4 - 81.1 mg/dL   Total Protein 6.4  6.0 - 8.3 g/dL   Albumin 3.6  3.5 - 5.2 g/dL   AST 70 (*) 0 - 37 U/L   ALT 67 (*) 0 - 53 U/L   Alkaline Phosphatase 40  39 - 117 U/L   Total Bilirubin 0.4  0.3 - 1.2 mg/dL   GFR calc non Af Amer 79 (*) >90 mL/min   GFR calc Af Amer >90  >90 mL/min  MAGNESIUM      Result Value Range   Magnesium 1.6  1.5 - 2.5 mg/dL  PHOSPHORUS      Result Value Range   Phosphorus 3.6  2.3 - 4.6 mg/dL  CBC      Result Value Range   WBC 8.5  4.0 - 10.5 K/uL   RBC 5.70  4.22 - 5.81 MIL/uL   Hemoglobin 14.5  13.0 - 17.0 g/dL   HCT 91.4  78.2 - 95.6 %   MCV 74.4 (*) 78.0 - 100.0 fL   MCH 25.4 (*) 26.0 - 34.0 pg   MCHC 34.2  30.0 - 36.0 g/dL   RDW 21.3 (*) 08.6 - 57.8 %   Platelets 203  150 - 400 K/uL    Discharge Medications:     Medication List     STOP taking these medications       CIALIS 20 MG tablet  Generic drug:  tadalafil     Hydrocodone-Acetaminophen 7.5-300 MG Tabs  Commonly known as:  VICODIN ES  Replaced by:  HYDROcodone-acetaminophen 7.5-325 mg/15 ml solution      TAKE these medications       HYDROcodone-acetaminophen 7.5-325 mg/15 ml solution  Commonly known as:  HYCET  Take 15-20 mLs by mouth every 4 (four) hours as needed for pain.     lisinopril-hydrochlorothiazide 20-12.5 MG per tablet  Commonly known as:  PRINZIDE,ZESTORETIC  Take 1 tablet by mouth daily.     multivitamin with minerals tablet  Take 1 tablet by mouth daily.     nabumetone 500 MG tablet  Commonly known as:  RELAFEN  Take 1 tablet (500 mg total) by mouth 2 (two) times daily.  Diagnostic Studies: Dg Chest 2 View  07/29/2013   *RADIOLOGY REPORT*  Clinical Data: Hypertension. Preoperative for neck surgery and tonsillectomy.  CHEST - 2 VIEW  Comparison: None.  Findings: There is no focal infiltrate, pulmonary edema, or pleural effusion.  The mediastinal contour and cardiac silhouette are normal.  The soft tissues and osseous structures are normal.  IMPRESSION: No acute cardiopulmonary disease identified.   Original Report Authenticated By: Sherian Rein, M.D.    Disposition: Final discharge disposition not confirmed      Discharge Orders   Future Orders Complete By Expires   Diet - low sodium heart healthy  As directed    Discharge instructions  As directed    Comments:     Tonsillectomy Care After  Refer to this sheet in the next few weeks. These instructions provide you with information on caring for yourself after your procedure. Your caregiver may also give you more specific instructions. Your treatment has been planned according to current medical practices, but problems sometimes occur. Call your caregiver if you have any problems or questions after your procedure. HOME CARE INSTRUCTIONS Recovery time will depend on the  extent of your tonsillectomy. Ear and throat pain is normal and will go away after a few days. Snoring and bad breath should improve after about 1 week.  Rest often for 2 to 3 days after the procedure, but move around gently as tolerated.  Elevate your head on pillows for several days.  Take pain medicine as directed. You may need to take it with food. Taking it on a regular schedule will help you to avoid pain.  Take your antibiotics as directed. Finish them even if you start to feel better.   Mild ear pain is normal. Apply a warm compress to your ear.  Eat soft, plain foods, such as bananas, applesauce, eggs, mashed potatoes, instant cereal, gelatin, pudding, yogurt, frozen ice pops, and ice cream for 4 to 6 weeks. Avoid spicy foods.  Drink cool liquids to ease swelling.  Make sure fiber is included in your diet if possible. High-fiber meals are harder to swallow with your sore throat, but they can help you avoid constipation caused by the use of certain pain medicines.  Drink enough fluids to keep your urine clear or pale yellow. This can help you to avoid constipation and dehydration.  Ask your caregiver about dietary supplements, such as special protein drinks and multivitamins.  Your caregiver may ask you to chew gum often, to encourage swallowing.  Limit activity for 2 weeks. Do not participate in sports or strenuous exercise during this time.  Ask your caregiver when you may return to work.  SEEK MEDICAL CARE IF: You have pain, pressure, or swelling.  You have a painful earache.  Your sutures come apart or fall out.  You are feeling sick to your stomach (nauseous).  You are throwing up (vomiting).  You spit up blood often, beyond the first week after surgery. (At first this is normal, as scabs fall off.)  You have questions or concerns.  Food continues to get caught behind your nose while you eat for longer than 1 week.  SEEK IMMEDIATE MEDICAL CARE IF: Throat or ear pain does not go  away or becomes severe.  You have a high fever (>101) Repeated vomiting of blood occurs.  You have trouble breathing.  You have not eaten for more than 24 hours.  MAKE SURE YOU:  Understand these instructions.  Will watch your condition.  Will  get help right away if you are not doing well or get worse.  Document Released: 12/21/2010 Document Revised: 07/31/2011 Document Reviewed: 12/21/2010 [facility] Patient Information 2012 [place], [place].   Increase activity slowly  As directed       Follow-up Information   Follow up with Melvenia Beam, MD. Schedule an appointment as soon as possible for a visit in 1 week.   Specialty:  Otolaryngology   Contact information:   [address] [place] [address] [phone]        Signed: Osborn Coho 08/07/2013, 9:08 AM

## 2013-08-07 NOTE — Progress Notes (Signed)
   ENT Progress Note: POD #1  s/p Procedure(s): PAROTIDECTOMY TONSILLECTOMY CYST REMOVAL   Subjective: C/O severe ST  Objective: Vital signs in last 24 hours: Temp:  [97.9 F (36.6 C)-99.4 F (37.4 C)] 98.4 F (36.9 C) (09/06 0604) Pulse Rate:  [84-111] 92 (09/06 0604) Resp:  [15-26] 19 (09/06 0604) BP: (133-155)/(72-117) 144/86 mmHg (09/06 0604) SpO2:  [84 %-96 %] 96 % (09/06 0604) Weight:  [112.038 kg (247 lb)] 112.038 kg (247 lb) (09/05 1900) Weight change:  Last BM Date: 08/05/13  Intake/Output from previous day: 09/05 0701 - 09/06 0700 In: 6576.7 [P.O.:960; I.V.:5116.7; IV Piggyback:500] Out: 1413 [Urine:1218; Drains:95; Blood:100] Intake/Output this shift:    Labs:  Recent Labs  08/06/13 1335  WBC 8.5  HGB 14.5  HCT 42.4  PLT 203    Recent Labs  08/06/13 1335  NA 141  K 4.1  CL 104  CO2 22  GLUCOSE 142*  BUN 14  CALCIUM 8.4    Studies/Results: No results found.   PHYSICAL EXAM: Inc intact, nl FN func  JP removed OP - stable, mod swellling, no bleeding   Assessment/Plan: Pt stable D/C to home     Dabid Godown 08/07/2013, 8:58 AM

## 2013-08-07 NOTE — Discharge Planning (Addendum)
Copy of home instructions to pt who verbalizes understanding. Drain out per MD. Will dc per w/c to private car home acomp. By wife, with all personal belongings at 40.

## 2013-08-09 ENCOUNTER — Encounter (HOSPITAL_COMMUNITY): Payer: Self-pay | Admitting: Otolaryngology

## 2013-08-09 MED FILL — Bacitracin-Polymyxin B Oint: CUTANEOUS | Qty: 1 | Status: AC

## 2014-08-26 DIAGNOSIS — N5201 Erectile dysfunction due to arterial insufficiency: Secondary | ICD-10-CM | POA: Insufficient documentation

## 2014-08-26 DIAGNOSIS — E785 Hyperlipidemia, unspecified: Secondary | ICD-10-CM | POA: Insufficient documentation

## 2014-09-14 DIAGNOSIS — G8929 Other chronic pain: Secondary | ICD-10-CM | POA: Insufficient documentation

## 2014-10-16 DIAGNOSIS — D126 Benign neoplasm of colon, unspecified: Secondary | ICD-10-CM | POA: Insufficient documentation

## 2014-10-16 DIAGNOSIS — K5730 Diverticulosis of large intestine without perforation or abscess without bleeding: Secondary | ICD-10-CM | POA: Insufficient documentation

## 2015-12-03 ENCOUNTER — Encounter (HOSPITAL_BASED_OUTPATIENT_CLINIC_OR_DEPARTMENT_OTHER): Payer: Self-pay | Admitting: *Deleted

## 2015-12-03 ENCOUNTER — Emergency Department (HOSPITAL_BASED_OUTPATIENT_CLINIC_OR_DEPARTMENT_OTHER)
Admission: EM | Admit: 2015-12-03 | Discharge: 2015-12-04 | Discharge status: Against Medical Advice | Payer: No Typology Code available for payment source | Attending: Emergency Medicine | Admitting: Emergency Medicine

## 2015-12-03 DIAGNOSIS — H10213 Acute toxic conjunctivitis, bilateral: Secondary | ICD-10-CM | POA: Insufficient documentation

## 2015-12-03 DIAGNOSIS — Z79899 Other long term (current) drug therapy: Secondary | ICD-10-CM | POA: Diagnosis not present

## 2015-12-03 DIAGNOSIS — Z8739 Personal history of other diseases of the musculoskeletal system and connective tissue: Secondary | ICD-10-CM | POA: Diagnosis not present

## 2015-12-03 DIAGNOSIS — K219 Gastro-esophageal reflux disease without esophagitis: Secondary | ICD-10-CM | POA: Diagnosis not present

## 2015-12-03 DIAGNOSIS — I10 Essential (primary) hypertension: Secondary | ICD-10-CM | POA: Insufficient documentation

## 2015-12-03 DIAGNOSIS — S29002A Unspecified injury of muscle and tendon of back wall of thorax, initial encounter: Secondary | ICD-10-CM | POA: Insufficient documentation

## 2015-12-03 DIAGNOSIS — S29001A Unspecified injury of muscle and tendon of front wall of thorax, initial encounter: Secondary | ICD-10-CM | POA: Diagnosis present

## 2015-12-03 DIAGNOSIS — Y9389 Activity, other specified: Secondary | ICD-10-CM | POA: Insufficient documentation

## 2015-12-03 DIAGNOSIS — Y92410 Unspecified street and highway as the place of occurrence of the external cause: Secondary | ICD-10-CM | POA: Insufficient documentation

## 2015-12-03 DIAGNOSIS — T59893A Toxic effect of other specified gases, fumes and vapors, assault, initial encounter: Secondary | ICD-10-CM | POA: Diagnosis not present

## 2015-12-03 DIAGNOSIS — Y998 Other external cause status: Secondary | ICD-10-CM | POA: Diagnosis not present
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
## 2015-12-03 NOTE — ED Notes (Signed)
Dr. Florina Ou with pt at this time, pt sitting in chair, alert, NAD, calm, interactive, uncooperative, face red, able to open eyes, prefers eyes closed, no dyspnea noted, resps e/u, speaking in clear complete non-pressured speech. Refusing care to EDP and RN.

## 2015-12-03 NOTE — Discharge Instructions (Signed)

## 2015-12-03 NOTE — ED Notes (Signed)
Pt dispositioned for AMA, pt refusing care.  Pt to have blood drawn for PD after AMA. Pt refuses to sign.

## 2015-12-04 NOTE — ED Notes (Signed)
Pt changed mind after chart d/c'd from computer, he stated "he wanted to sign refusal form/ AMA form, e-signature obtained, EDP notified. Pt again verbalizes does not want to be seen, assessed or treated. Declines care adamantly.

## 2016-08-26 ENCOUNTER — Other Ambulatory Visit: Payer: Self-pay | Admitting: Orthopedic Surgery

## 2016-08-26 DIAGNOSIS — M25562 Pain in left knee: Secondary | ICD-10-CM

## 2017-06-06 ENCOUNTER — Ambulatory Visit (INDEPENDENT_AMBULATORY_CARE_PROVIDER_SITE_OTHER): Payer: Self-pay

## 2017-06-06 ENCOUNTER — Ambulatory Visit (HOSPITAL_COMMUNITY)
Admission: EM | Admit: 2017-06-06 | Discharge: 2017-06-06 | Disposition: A | Discharge status: Home / Self Care | Payer: Self-pay | Attending: Internal Medicine | Admitting: Internal Medicine

## 2017-06-06 ENCOUNTER — Encounter (HOSPITAL_COMMUNITY): Payer: Self-pay | Admitting: *Deleted

## 2017-06-06 DIAGNOSIS — M545 Low back pain, unspecified: Secondary | ICD-10-CM

## 2017-06-06 DIAGNOSIS — M19072 Primary osteoarthritis, left ankle and foot: Secondary | ICD-10-CM

## 2017-06-06 DIAGNOSIS — M25522 Pain in left elbow: Secondary | ICD-10-CM

## 2017-06-06 DIAGNOSIS — M25562 Pain in left knee: Secondary | ICD-10-CM

## 2017-06-06 DIAGNOSIS — M25521 Pain in right elbow: Secondary | ICD-10-CM

## 2017-06-06 DIAGNOSIS — M25512 Pain in left shoulder: Secondary | ICD-10-CM

## 2017-06-06 DIAGNOSIS — M549 Dorsalgia, unspecified: Secondary | ICD-10-CM

## 2017-06-06 DIAGNOSIS — M1712 Unilateral primary osteoarthritis, left knee: Secondary | ICD-10-CM

## 2017-06-06 DIAGNOSIS — M21822 Other specified acquired deformities of left upper arm: Secondary | ICD-10-CM

## 2017-06-06 DIAGNOSIS — M5489 Other dorsalgia: Secondary | ICD-10-CM

## 2017-06-06 DIAGNOSIS — S42292A Other displaced fracture of upper end of left humerus, initial encounter for closed fracture: Secondary | ICD-10-CM

## 2017-06-06 MED ORDER — MELOXICAM 15 MG PO TABS: 15.0000 mg | ORAL_TABLET | Freq: Every day | ORAL | 0 refills | Status: DC

## 2017-06-06 MED ORDER — TRAMADOL HCL 50 MG PO TABS: 50.0000 mg | ORAL_TABLET | Freq: Four times a day (QID) | ORAL | 0 refills | Status: DC | PRN

## 2017-06-06 MED ORDER — CYCLOBENZAPRINE HCL 10 MG PO TABS: 10.0000 mg | ORAL_TABLET | Freq: Two times a day (BID) | ORAL | 0 refills | Status: DC | PRN
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
## 2017-06-06 NOTE — Discharge Instructions (Signed)
°  Meloxicam (Mobic) is an antiinflammatory to help with pain and inflammation.  Do not take ibuprofen, Advil, Aleve, or any other medications that contain NSAIDs while taking meloxicam as this may cause stomach upset or even ulcers if taken in large amounts for an extended period of time.   Flexeril (cyclobenzaprine) is a muscle relaxer and may cause drowsiness. Do not drink alcohol, drive, or operate heavy machinery while taking.  Tramadol is strong pain medication. While taking, do not drink alcohol, drive, or perform any other activities that requires focus while taking these medications.

## 2017-08-20 DIAGNOSIS — I4891 Unspecified atrial fibrillation: Secondary | ICD-10-CM | POA: Diagnosis present

## 2017-11-01 ENCOUNTER — Emergency Department (HOSPITAL_COMMUNITY)
Admission: EM | Admit: 2017-11-01 | Discharge: 2017-11-01 | Payer: Self-pay | Attending: Emergency Medicine | Admitting: Emergency Medicine

## 2017-11-01 ENCOUNTER — Other Ambulatory Visit: Payer: Self-pay

## 2017-11-01 ENCOUNTER — Encounter (HOSPITAL_COMMUNITY): Payer: Self-pay | Admitting: *Deleted

## 2017-11-01 DIAGNOSIS — Y929 Unspecified place or not applicable: Secondary | ICD-10-CM | POA: Insufficient documentation

## 2017-11-01 DIAGNOSIS — Y9389 Activity, other specified: Secondary | ICD-10-CM | POA: Insufficient documentation

## 2017-11-01 DIAGNOSIS — Y999 Unspecified external cause status: Secondary | ICD-10-CM | POA: Insufficient documentation

## 2017-11-01 DIAGNOSIS — Z79899 Other long term (current) drug therapy: Secondary | ICD-10-CM | POA: Insufficient documentation

## 2017-11-01 DIAGNOSIS — I10 Essential (primary) hypertension: Secondary | ICD-10-CM | POA: Insufficient documentation

## 2017-11-01 DIAGNOSIS — S0181XA Laceration without foreign body of other part of head, initial encounter: Secondary | ICD-10-CM | POA: Insufficient documentation

## 2017-11-01 MED ORDER — LIDOCAINE HCL (PF) 1 % IJ SOLN
5.0000 mL | Freq: Once | INTRAMUSCULAR | Status: AC
  Administered 2017-11-01: 5 mL via INTRADERMAL
  Filled 2017-11-01: qty 5
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
## 2017-11-01 NOTE — Discharge Instructions (Addendum)
Patient has dissolvable stitch in the brow. No reason to have it removed unless there are signs of infection. Clean wound 2xday
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
## 2017-11-01 NOTE — ED Notes (Signed)
He is awake and alert and is speaking with two [employer] as I write this.

## 2018-01-06 DIAGNOSIS — F5109 Other insomnia not due to a substance or known physiological condition: Secondary | ICD-10-CM

## 2018-01-06 DIAGNOSIS — N529 Male erectile dysfunction, unspecified: Secondary | ICD-10-CM | POA: Insufficient documentation

## 2018-01-06 HISTORY — DX: Other insomnia not due to a substance or known physiological condition: F51.09
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
## 2018-10-23 DIAGNOSIS — M25562 Pain in left knee: Secondary | ICD-10-CM | POA: Insufficient documentation

## 2018-10-23 DIAGNOSIS — M25561 Pain in right knee: Secondary | ICD-10-CM | POA: Insufficient documentation

## 2019-01-18 DIAGNOSIS — M1712 Unilateral primary osteoarthritis, left knee: Secondary | ICD-10-CM | POA: Insufficient documentation

## 2019-07-06 DIAGNOSIS — M25572 Pain in left ankle and joints of left foot: Secondary | ICD-10-CM | POA: Insufficient documentation

## 2019-08-10 DIAGNOSIS — S93422A Sprain of deltoid ligament of left ankle, initial encounter: Secondary | ICD-10-CM

## 2019-08-10 HISTORY — DX: Sprain of deltoid ligament of left ankle, initial encounter: S93.422A

## 2020-01-11 DIAGNOSIS — R2242 Localized swelling, mass and lump, left lower limb: Secondary | ICD-10-CM

## 2020-01-11 DIAGNOSIS — R7401 Elevation of levels of liver transaminase levels: Secondary | ICD-10-CM | POA: Insufficient documentation

## 2020-01-11 HISTORY — DX: Localized swelling, mass and lump, left lower limb: R22.42

## 2020-01-18 DIAGNOSIS — G4733 Obstructive sleep apnea (adult) (pediatric): Secondary | ICD-10-CM | POA: Insufficient documentation

## 2020-04-24 DIAGNOSIS — R2241 Localized swelling, mass and lump, right lower limb: Secondary | ICD-10-CM

## 2020-04-24 HISTORY — DX: Localized swelling, mass and lump, right lower limb: R22.41

## 2020-08-22 DIAGNOSIS — M1A09X0 Idiopathic chronic gout, multiple sites, without tophus (tophi): Secondary | ICD-10-CM | POA: Insufficient documentation

## 2020-08-22 DIAGNOSIS — I890 Lymphedema, not elsewhere classified: Secondary | ICD-10-CM | POA: Insufficient documentation

## 2021-04-08 DIAGNOSIS — I517 Cardiomegaly: Secondary | ICD-10-CM | POA: Insufficient documentation

## 2021-04-08 DIAGNOSIS — D509 Iron deficiency anemia, unspecified: Secondary | ICD-10-CM | POA: Insufficient documentation

## 2021-05-05 DIAGNOSIS — N179 Acute kidney failure, unspecified: Secondary | ICD-10-CM | POA: Diagnosis present

## 2021-05-18 DIAGNOSIS — F32A Depression, unspecified: Secondary | ICD-10-CM | POA: Insufficient documentation

## 2021-05-18 DIAGNOSIS — I071 Rheumatic tricuspid insufficiency: Secondary | ICD-10-CM | POA: Insufficient documentation

## 2021-05-18 DIAGNOSIS — Z789 Other specified health status: Secondary | ICD-10-CM | POA: Insufficient documentation

## 2021-05-18 DIAGNOSIS — Z91148 Patient's other noncompliance with medication regimen for other reason: Secondary | ICD-10-CM | POA: Insufficient documentation

## 2021-05-18 DIAGNOSIS — R160 Hepatomegaly, not elsewhere classified: Secondary | ICD-10-CM | POA: Insufficient documentation

## 2021-05-18 DIAGNOSIS — I340 Nonrheumatic mitral (valve) insufficiency: Secondary | ICD-10-CM | POA: Insufficient documentation

## 2021-05-18 DIAGNOSIS — I5032 Chronic diastolic (congestive) heart failure: Secondary | ICD-10-CM | POA: Diagnosis present

## 2021-05-18 DIAGNOSIS — Z8616 Personal history of COVID-19: Secondary | ICD-10-CM | POA: Insufficient documentation

## 2021-05-18 HISTORY — DX: Personal history of COVID-19: Z86.16

## 2021-06-28 DIAGNOSIS — R9439 Abnormal result of other cardiovascular function study: Secondary | ICD-10-CM | POA: Insufficient documentation

## 2021-10-10 DIAGNOSIS — M47812 Spondylosis without myelopathy or radiculopathy, cervical region: Secondary | ICD-10-CM | POA: Insufficient documentation

## 2022-02-28 ENCOUNTER — Other Ambulatory Visit: Payer: Self-pay | Admitting: Urology

## 2022-03-07 NOTE — Patient Instructions (Addendum)
2 VISITORS  ARE ALLOWED TO COME WITH YOU AND STAY IN THE WAITING ROOM ONLY DURING PRE OP AND PROCEDURE.   ?**NO VISITORS ARE ALLOWED IN THE SHORT STAY AREA OR RECOVERY ROOM!!** ? ?  ? ? Your procedure is scheduled on: 03/18/22 ? ? Report to [facility] Main Entrance ? ?  Report to admitting at 6:45 AM ? ? Call this number if you have problems the morning of surgery [phone] ? ? Do not eat food  or drink:After [place]. ? ? ?  If you have questions, please contact your surgeon?s office. ? ? ?Oral Hygiene is also important to reduce your risk of infection.                                    ?Remember - BRUSH YOUR TEETH THE MORNING OF SURGERY WITH YOUR REGULAR TOOTHPASTE ? ? Do NOT smoke after Midnight ? ? Take these medicines the morning of surgery with A SIP OF WATER:Gabapentin (Neurontin), Amiotarone, Diltiazem ? ? ?                  ?           You may not have any metal on your body including  jewelry, and body piercing ? ?           Do not wear lotions, powders, cologne, or deodorant ? ?.  Men may shave face and neck. ? ? Do not bring valuables to the hospital. [facility] NOT ?            RESPONSIBLE   FOR VALUABLES. ? ? Contacts, dentures or bridgework may not be worn into surgery. ? ?  ? Patients discharged on the day of surgery will not be allowed to drive home.  Someone NEEDS to stay with you for the first 24 hours after anesthesia. ? ? Special Instructions: Bring a copy of your healthcare power of attorney and living will documents  the day of surgery if you haven't scanned them before. ? ?            Please read over the following fact sheets you were given: IF [place] [phone] ? ?   [place] - Preparing for Surgery ?Before surgery, you can play an important role.  Because skin is not sterile, your skin needs to be as free of germs as possible.  You can reduce the number of germs on your skin by washing with CHG (chlorahexidine  gluconate) soap before surgery.  CHG is an antiseptic cleaner which kills germs and bonds with the skin to continue killing germs even after washing. ?Please DO NOT use if you have an allergy to CHG or antibacterial soaps.  If your skin becomes reddened/irritated stop using the CHG and inform your nurse when you arrive at Short Stay.  You may shave your face/neck. ?Please follow these instructions carefully: ? 1.  Shower with CHG Soap the night before surgery and the  morning of Surgery. ? 2.  If you choose to wash your hair, wash your hair first as usual with your  normal  shampoo. ? 3.  After you shampoo, rinse your hair and body thoroughly to remove the  shampoo.                        ?  4.  Use CHG as you would any other liquid soap.  You can apply chg directly  to the skin and wash  ?                     Gently with a scrungie or clean washcloth. ? 5.  Apply the CHG Soap to your body ONLY FROM THE NECK DOWN.   Do not use on face/ open      ?                     Wound or open sores. Avoid contact with eyes, ears mouth and genitals (private parts).  ?                     Production manager,  Genitals (private parts) with your normal soap. ?            6.  Wash thoroughly, paying special attention to the area where your surgery  will be performed. ? 7.  Thoroughly rinse your body with warm water from the neck down. ? 8.  DO NOT shower/wash with your normal soap after using and rinsing off  the CHG Soap. ?               9.  Pat yourself dry with a clean towel. ?           10.  Wear clean pajamas. ?           11.  Place clean sheets on your bed the night of your first shower and do not  sleep with pets. ?Day of Surgery : ?Do not apply any lotions/deodorants the morning of surgery.  Please wear clean clothes to the hospital/surgery center. ? ?FAILURE TO FOLLOW THESE INSTRUCTIONS MAY RESULT IN THE CANCELLATION OF YOUR SURGERY ? ? ? ?________________________________________________________________________  ` ?

## 2022-03-11 ENCOUNTER — Other Ambulatory Visit: Payer: Self-pay

## 2022-03-11 ENCOUNTER — Encounter (HOSPITAL_COMMUNITY): Payer: Self-pay

## 2022-03-11 ENCOUNTER — Encounter (HOSPITAL_COMMUNITY): Payer: Self-pay | Admitting: Emergency Medicine

## 2022-03-11 ENCOUNTER — Encounter (HOSPITAL_COMMUNITY)
Admission: RE | Admit: 2022-03-11 | Discharge: 2022-03-11 | Disposition: A | Discharge status: Home / Self Care | Payer: BC Managed Care – PPO | Source: Ambulatory Visit | Attending: Urology | Admitting: Urology

## 2022-03-11 VITALS — HR 78 | Temp 98.2°F | Resp 18 | Ht 71.0 in | Wt 287.0 lb

## 2022-03-11 DIAGNOSIS — E119 Type 2 diabetes mellitus without complications: Secondary | ICD-10-CM | POA: Insufficient documentation

## 2022-03-11 DIAGNOSIS — Z01818 Encounter for other preprocedural examination: Secondary | ICD-10-CM | POA: Diagnosis present

## 2022-03-11 DIAGNOSIS — I110 Hypertensive heart disease with heart failure: Secondary | ICD-10-CM | POA: Diagnosis not present

## 2022-03-11 DIAGNOSIS — I4891 Unspecified atrial fibrillation: Secondary | ICD-10-CM | POA: Diagnosis not present

## 2022-03-11 HISTORY — DX: Cardiac arrhythmia, unspecified: I49.9

## 2022-03-11 HISTORY — DX: Dyspnea, unspecified: R06.00

## 2022-03-11 HISTORY — DX: Localized edema: R60.0

## 2022-03-11 HISTORY — DX: Edema, unspecified: R60.9

## 2022-03-11 HISTORY — DX: Unspecified diastolic (congestive) heart failure: I50.30

## 2022-03-11 HISTORY — DX: Unspecified osteoarthritis, unspecified site: M19.90

## 2022-03-11 HISTORY — DX: Type 2 diabetes mellitus without complications: E11.9

## 2022-03-11 LAB — BASIC METABOLIC PANEL
Anion gap: 9 (ref 5–15)
BUN: 13 mg/dL (ref 6–20)
CO2: 27 mmol/L (ref 22–32)
Calcium: 8.7 mg/dL — ABNORMAL LOW (ref 8.9–10.3)
Chloride: 102 mmol/L (ref 98–111)
Creatinine, Ser: 0.95 mg/dL (ref 0.61–1.24)
GFR, Estimated: >60 mL/min (ref >60)
Glucose, Bld: 95 mg/dL (ref 70–99)
Potassium: 3.6 mmol/L (ref 3.5–5.1)
Sodium: 138 mmol/L (ref 135–145)

## 2022-03-11 LAB — CBC
HCT: 43 % (ref 39.0–52.0)
Hemoglobin: 13.7 g/dL (ref 13.0–17.0)
MCH: 23.1 pg — ABNORMAL LOW (ref 26.0–34.0)
MCHC: 31.9 g/dL (ref 30.0–36.0)
MCV: 72.4 fL — ABNORMAL LOW (ref 80.0–100.0)
Platelets: 198 10*3/uL (ref 150–400)
RBC: 5.94 MIL/uL — ABNORMAL HIGH (ref 4.22–5.81)
RDW: 18.9 % — ABNORMAL HIGH (ref 11.5–15.5)
WBC: 5.4 10*3/uL (ref 4.0–10.5)
nRBC: 0 % (ref 0.0–0.2)

## 2022-03-11 NOTE — Progress Notes (Incomplete)
Anesthesia note: ? ?Bowel prep reminder:NA ? ?PCP - Pt used to see Dr. Driscilla Moats but is looking for a new PCP ?Cardiologist -Dr. Corine Shelter ?Other-  ? ?Chest x-ray - no ?EKG - 03/11/22 ?Stress Test -  ?ECHO -  ?Cardiac Cath -  ? ?Pacemaker/ICD device last checked: ? ?Sleep Study - no Stop Bang score 6. He will get a sleep study when he has a new PCP ?CPAP -  ? ?Pt is pre diabetic-NA ?Fasting Blood Sugar -  ?Checks Blood Sugar _____ ? ?Blood Thinner:Xarelto/ Dr. Gelene Mink ?Blood Thinner Instructions:Stop 3 days prior to DOS/ Dr. Gelene Mink ?Aspirin Instructions:NA ?Last Dose:03/14/22 ? ?Anesthesia review: yes ? ?Patient denies shortness of breath, fever, cough and chest pain at PAT appointment ?EKG rapid A-fib Angela the anesthesia PA was called to review and talk to the Pt. ? ?Patient verbalized understanding of instructions that were given to them at the PAT appointment. Patient was also instructed that they will need to review over the PAT instructions again at home before surgery. yes ?

## 2022-03-12 ENCOUNTER — Encounter (HOSPITAL_COMMUNITY): Payer: Self-pay

## 2022-03-12 NOTE — Progress Notes (Signed)
Anesthesia consult ? ? Case: [id number] Date/Time: 03/18/22 0845  ? Procedure: CO2 LASER ABLATION OF SCROTAL LESIONS - 1 HR FOR CASE  ? Anesthesia type: General  ? Pre-op diagnosis: SCROTAL  LESIONS  ? Location: [facility] PROC[facility]URE [facility] / [facility]  ? Surgeons: Lucas Mallow, MD  ? ?  ? ? ?DISCUSSION: ?Pt is 56 years old with hx atrial fibrillation, HFpEF, HTN, peripheral edema, DM (not currently on any medications) ? ?- Hospitalized 7/23-28/22 at [facility] (notes in care everywhere) for acute on chronic HF and afib with RVR. Tx with IV diuretics. Spontaneously converted to SR and planned cardioversion cancelled.  ? ?- Hospitalized 6/17-19/22 at [facility] (notes in care everywhere) for acute on chronic HF. Tx with IV diuretics ? ?Saw pt in pre-surgical testing for afib with RVR on EKG. HR 126. Pt reports he does not feel when he is in afib. Denies chest pain or pressure, SOB, syncope or lightheadedness. Reports peripheral edema is an ongoing problem and states it is not different than usual. Heart rhythm is irregular and tachycardic; lungs are CTA B. Pt is in no acute distress. He reports he is taking medications as prescribed.  ? ?I spoke with Tammy a nurse in EP cardiologist Dr. Marvene Staff office. She asked that pt monitor his HR and rhythm and notify their office if his HR is >130 for 30 minutes, or if he remains in afib for 24 hours, or if he develops symptoms. Pt has a pulse ox monitor at home and will monitor his HR. He demonstrated he can check his pulse to determine if it is irregular (since he doesn't feel afib). He will contact Dr. Marvene Staff office if HR remains high or he remains in afib. Dr. Marvene Staff office has RN answering phone 24 hours a day if he needs to call after hours.  ? ? ?Pt to stop xarelto 3 days before surgery ? ?I reviewed case with Dr. Lanetta Inch. Pt does not have cardiologist monitoring his HF. Reviewing discharge summaries from last summer's admissions for HF, it appears pt was supposed to have cardiology f/u in  08/2021, but this did not happen for unknown reasons. Pt will need cardiology eval prior to this elective surgery. I notified Pam in Dr. Purvis Sheffield office.  ? ? ?VS: Pulse 78   Temp 36.8 ?C (Oral)   Resp 18   Ht '5\' 11"'$  (1.803 m)   Wt 130.2 kg   SpO2 100%   BMI 40.03 kg/m?  ? ?PROVIDERS: ?- Saw Trevor Mace, MD as PCP in [place] once on 10/31/21 but pt reports he has since moved to [place] and is looking for PCP in [place] (note in care everywhere)  ? ? ?LABS: Labs reviewed: Acceptable for surgery. ?(all labs ordered are listed, but only abnormal results are displayed) ? ?Labs Reviewed  ?BASIC METABOLIC PANEL - Abnormal; Notable for the following components:  ?    Result Value  ? Calcium 8.7 (*)   ? All other components within normal limits  ?CBC - Abnormal; Notable for the following components:  ? RBC 5.94 (*)   ? MCV 72.4 (*)   ? MCH 23.1 (*)   ? RDW 18.9 (*)   ? All other components within normal limits  ? ? ? ?IMAGES: ?CXR 06/23/21 (care everywhere):  ?1.  Cardiomegaly with interstitial pulmonary edema.  ?2.  Similar small right pleural effusion with overlying atelectasis. ? ? ?EKG 03/11/22: Atrial fibrillation with rapid ventricular response, HR 126. Anterolateral infarct , age  undetermined ? ? ?CV: ?Cardiac cath 07/23/21:  ?- LAD is a large wraparound vessel supplying distal PDA territory. Mid LAD lesion is 20% stenosed.  ?- LCx is normal. First Obtuse Marginal Branch: The vessel is small. Second Obtuse Marginal Branch: The vessel is small.  ?-  RCA is co-dominant and normal.  ? ?Echo 06/25/21:  ?- The left ventricular size is normal.  ?- There is mild concentric left ventricular hypertrophy.  ?- Left ventricular systolic function is low normal.  ?- LV ejection fraction = 55%.  ?- The right ventricle is mild to moderately dilated.  ?- Right ventricular free wall is hypokinetic.  ?- Right ventricular apex is akinetic.  ?- The left atrium is mildly dilated.  ?- The right atrium is mildly dilated.  ?-  There is mild mitral regurgitation.  ?- There is mild tricuspid regurgitation.  ?- Estimated right ventricular systolic pressure is 47 mmHg.  ?- The IVC is _ dialted with an abnormal collapsibility index, this suggestive of increased right atrial pressure.  ?- There is trivial pericardial effusion.  ?- Pericardial effusion is circumferential  ?- Compared to images of 04/07/18, RV dysfunction has developed.  ? ? ?Past Medical History:  ?Diagnosis Date  ? (HFpEF) heart failure with preserved ejection fraction ([place])   ? Arthritis   ? knees. elbow, ankle  ? Diabetes mellitus without complication ([place])   ? Dyspnea   ? when in A-fib  ? Dysrhythmia   ? A-fib  ? GERD (gastroesophageal reflux disease)   ? takes Dexilant prn  ? Hypertension   ? takes Lisinopril-HCTZ daily  ? Muscle spasm   ? takes Relafen daily  ? Peripheral edema   ? Plantar fasciitis   ? right  ? ? ?Past Surgical History:  ?Procedure Laterality Date  ? EAR CYST EXCISION N/A 08/06/2013  ? Procedure: CYST REMOVAL;  Surgeon: Ruby Cola, MD;  Location: [place];  Service: ENT;  Laterality: N/A;  ? ESOPHAGOGASTRODUODENOSCOPY    ? left knee arthroscopy  2003  ? PAROTIDECTOMY Right 08/06/2013  ? Procedure: PAROTIDECTOMY;  Surgeon: Ruby Cola, MD;  Location: [place];  Service: ENT;  Laterality: Right;  ? TONSILLECTOMY Bilateral 08/06/2013  ? Procedure: TONSILLECTOMY;  Surgeon: Ruby Cola, MD;  [facility]: [facility];  Service: ENT;  Laterality: Bilateral;  ? ? ?MEDICATIONS: ? amiodarone (PACERONE) 200 MG tablet  ? cyclobenzaprine (FLEXERIL) 10 MG tablet  ? diltiazem (CARDIZEM CD) 180 MG 24 hr capsule  ? folic acid (FOLVITE) 1 MG tablet  ? gabapentin (NEURONTIN) 300 MG capsule  ? MAGNESIUM-OXIDE 400 (240 Mg) MG tablet  ? Multiple Vitamins-Minerals (MULTIVITAMIN WITH MINERALS) tablet  ? torsemide (DEMADEX) 20 MG tablet  ? XARELTO 20 MG TABS tablet  ? ?No current facility-administered medications for this encounter.  ? ? ? ? ? ? ? ? ?

## 2022-03-18 ENCOUNTER — Ambulatory Visit (HOSPITAL_COMMUNITY): Admission: RE | Admit: 2022-03-18 | Payer: BC Managed Care – PPO | Source: Home / Self Care | Admitting: Urology

## 2022-03-18 ENCOUNTER — Encounter (HOSPITAL_COMMUNITY): Admission: RE | Payer: Self-pay | Source: Home / Self Care

## 2022-03-18 SURGERY — CO2 LASER APPLICATION
Anesthesia: General

## 2022-05-16 DIAGNOSIS — I509 Heart failure, unspecified: Secondary | ICD-10-CM

## 2022-05-16 HISTORY — DX: Heart failure, unspecified: I50.9

## 2022-09-16 ENCOUNTER — Encounter: Payer: Self-pay | Admitting: Family Medicine

## 2022-09-16 ENCOUNTER — Telehealth: Payer: BC Managed Care – PPO | Admitting: Family Medicine

## 2022-09-16 DIAGNOSIS — B9689 Other specified bacterial agents as the cause of diseases classified elsewhere: Secondary | ICD-10-CM | POA: Diagnosis not present

## 2022-09-16 DIAGNOSIS — J019 Acute sinusitis, unspecified: Secondary | ICD-10-CM | POA: Diagnosis not present

## 2022-09-16 DIAGNOSIS — R051 Acute cough: Secondary | ICD-10-CM

## 2022-09-16 MED ORDER — AMOXICILLIN-POT CLAVULANATE 875-125 MG PO TABS: 1.0000 | ORAL_TABLET | Freq: Two times a day (BID) | ORAL | 0 refills | Status: AC

## 2022-09-16 MED ORDER — FLUTICASONE PROPIONATE 50 MCG/ACT NA SUSP: 2.0000 | Freq: Every day | NASAL | 0 refills | Status: AC

## 2022-09-16 MED ORDER — BENZONATATE 100 MG PO CAPS: 100.0000 mg | ORAL_CAPSULE | Freq: Two times a day (BID) | ORAL | 0 refills | Status: DC | PRN

## 2022-09-16 NOTE — Progress Notes (Signed)
Virtual Visit Consent   Parker [name], you are scheduled for a virtual visit with a [facility] provider today. Just as with appointments in the office, your consent must be obtained to participate. Your consent will be active for this visit and any virtual visit you may have with one of our providers in the next 365 days. If you have a MyChart account, a copy of this consent can be sent to you electronically.  As this is a virtual visit, video technology does not allow for your provider to perform a traditional examination. This may limit your provider's ability to fully assess your condition. If your provider identifies any concerns that need to be evaluated in person or the need to arrange testing (such as labs, EKG, etc.), we will make arrangements to do so. Although advances in technology are sophisticated, we cannot ensure that it will always work on either your end or our end. If the connection with a video visit is poor, the visit may have to be switched to a telephone visit. With either a video or telephone visit, we are not always able to ensure that we have a secure connection.  By engaging in this virtual visit, you consent to the provision of healthcare and authorize for your insurance to be billed (if applicable) for the services provided during this visit. Depending on your insurance coverage, you may receive a charge related to this service.  I need to obtain your verbal consent now. Are you willing to proceed with your visit today? Parker [name] has provided verbal consent on 09/16/2022 for a virtual visit (video or telephone). Perlie Mayo, NP  Date: 09/16/2022 10:08 AM  Virtual Visit via Video Note   I, Perlie Mayo, connected with  Parker [name]  ([id number], 1966-01-22) on 09/16/22 at 10:15 AM EDT by a video-enabled telemedicine application and verified that I am speaking with the correct person using two identifiers.  Location: Patient: Virtual Visit Location Patient:  Home Provider: Virtual Visit Location Provider: Home Office   I discussed the limitations of evaluation and management by telemedicine and the availability of in person appointments. The patient expressed understanding and agreed to proceed.    History of Present Illness: Parker [name] is a 56 y.o. who identifies as a male who was assigned male at birth, and is being seen today for sinus drainage  HPI: Sinusitis This is a new problem. The current episode started in the past 7 days. The problem has been gradually worsening since onset. The maximum temperature recorded prior to his arrival was 101 - 101.9 F. The fever has been present for Less than 1 day. Associated symptoms include congestion, coughing, headaches and sinus pressure. Pertinent negatives include no diaphoresis, ear pain, hoarse voice, neck pain, shortness of breath or sore throat. (Nausea and vomiting, drainage) Past treatments include acetaminophen and oral decongestants.    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   Acquired equinus deformity of foot 04/06/2013    Allergies:  Allergies  Allergen Reactions   Iodinated Contrast Media Itching   Other Rash    Other reaction(s): Other (See Comments) Neoprene: Rash   Medications:  Current Outpatient Medications:    amiodarone (PACERONE) 200 MG tablet, Take 200 mg by mouth daily., Disp: , Rfl:    cyclobenzaprine (FLEXERIL) 10 MG tablet, Take 1 tablet (10 mg total) by mouth 2 (  two) times daily as needed for muscle spasms., Disp: 20 tablet, Rfl: 0   diltiazem (CARDIZEM CD) 180 MG 24 hr capsule, Take 180 mg by mouth in the morning and at bedtime., Disp: , Rfl:    folic acid (FOLVITE) 1 MG tablet, Take 1 mg by mouth daily., Disp: , Rfl:    gabapentin (NEURONTIN) 300 MG capsule, Take 300 mg by mouth 3 (three) times daily., Disp: , Rfl:    MAGNESIUM-OXIDE 400 (240 Mg) MG tablet,  Take 400 mg by mouth 2 (two) times daily., Disp: , Rfl:    Multiple Vitamins-Minerals (MULTIVITAMIN WITH MINERALS) tablet, Take 1 tablet by mouth daily., Disp: , Rfl:    torsemide (DEMADEX) 20 MG tablet, Take 60 mg by mouth daily., Disp: , Rfl:    XARELTO 20 MG TABS tablet, Take 20 mg by mouth every evening., Disp: , Rfl:   Observations/Objective: Patient is well-developed, well-nourished in no acute distress.  Resting comfortably  at home.  Head is normocephalic, atraumatic.  No labored breathing.  Speech is clear and coherent with logical content.  Patient is alert and oriented at baseline.    Assessment and Plan:  1. Acute bacterial sinusitis  - amoxicillin-clavulanate (AUGMENTIN) 875-125 MG tablet; Take 1 tablet by mouth 2 (two) times daily for 7 days.  Dispense: 14 tablet; Refill: 0 - fluticasone (FLONASE) 50 MCG/ACT nasal spray; Place 2 sprays into both nostrils daily.  Dispense: 16 g; Refill: 0  2. Acute cough  - benzonatate (TESSALON) 100 MG capsule; Take 1 capsule (100 mg total) by mouth 2 (two) times daily as needed for cough.  Dispense: 20 capsule; Refill: 0   -rest -hydrate -medications has prescribed with addition of mucinex- plain -encouraged repeat covid testing   Reviewed side effects, risks and benefits of medication.    Patient acknowledged agreement and understanding of the plan.   Past Medical, Surgical, Social History, Allergies, and Medications have been Reviewed.    Follow Up Instructions: I discussed the assessment and treatment plan with the patient. The patient was provided an opportunity to ask questions and all were answered. The patient agreed with the plan and demonstrated an understanding of the instructions.  A copy of instructions were sent to the patient via MyChart unless otherwise noted below.     The patient was advised to call back or seek an in-person evaluation if the symptoms worsen or if the condition fails to improve as  anticipated.  Time:  I spent 10 minutes with the patient via telehealth technology discussing the above problems/concerns.    Perlie Mayo, NP

## 2022-09-16 NOTE — Patient Instructions (Signed)
Lyndal Pulley, thank you for joining Perlie Mayo, NP for today's virtual visit.  While this provider is not your primary care provider (PCP), if your PCP is located in our provider database this encounter information will be shared with them immediately following your visit.  [facility] account gives you access to today's visit and all your visits, tests, and labs performed at [facility] " click here if you don't have a [facility] account or go to [email or web address]  Consent: (Patient) Lyndal Pulley provided verbal consent for this virtual visit at the beginning of the encounter.  Current Medications:  Current Outpatient Medications:    amoxicillin-clavulanate (AUGMENTIN) 875-125 MG tablet, Take 1 tablet by mouth 2 (two) times daily for 7 days., Disp: 14 tablet, Rfl: 0   benzonatate (TESSALON) 100 MG capsule, Take 1 capsule (100 mg total) by mouth 2 (two) times daily as needed for cough., Disp: 20 capsule, Rfl: 0   fluticasone (FLONASE) 50 MCG/ACT nasal spray, Place 2 sprays into both nostrils daily., Disp: 16 g, Rfl: 0   amiodarone (PACERONE) 200 MG tablet, Take 200 mg by mouth daily., Disp: , Rfl:    cyclobenzaprine (FLEXERIL) 10 MG tablet, Take 1 tablet (10 mg total) by mouth 2 (two) times daily as needed for muscle spasms., Disp: 20 tablet, Rfl: 0   diltiazem (CARDIZEM CD) 180 MG 24 hr capsule, Take 180 mg by mouth in the morning and at bedtime., Disp: , Rfl:    folic acid (FOLVITE) 1 MG tablet, Take 1 mg by mouth daily., Disp: , Rfl:    gabapentin (NEURONTIN) 300 MG capsule, Take 300 mg by mouth 3 (three) times daily., Disp: , Rfl:    MAGNESIUM-OXIDE 400 (240 Mg) MG tablet, Take 400 mg by mouth 2 (two) times daily., Disp: , Rfl:    Multiple Vitamins-Minerals (MULTIVITAMIN WITH MINERALS) tablet, Take 1 tablet by mouth daily., Disp: , Rfl:    torsemide (DEMADEX) 20 MG tablet, Take 60 mg by mouth daily., Disp: , Rfl:    XARELTO 20 MG TABS tablet,  Take 20 mg by mouth every evening., Disp: , Rfl:    Medications ordered in this encounter:  Meds ordered this encounter  Medications   amoxicillin-clavulanate (AUGMENTIN) 875-125 MG tablet    Sig: Take 1 tablet by mouth 2 (two) times daily for 7 days.    Dispense:  14 tablet    Refill:  0    Order Specific Question:   Supervising Provider    Answer:   Chase Picket [[id number]]   fluticasone (FLONASE) 50 MCG/ACT nasal spray    Sig: Place 2 sprays into both nostrils daily.    Dispense:  16 g    Refill:  0    Order Specific Question:   Supervising Provider    Answer:   Chase Picket [[id number]]   benzonatate (TESSALON) 100 MG capsule    Sig: Take 1 capsule (100 mg total) by mouth 2 (two) times daily as needed for cough.    Dispense:  20 capsule    Refill:  0    Order Specific Question:   Supervising Provider    Answer:   Chase Picket [id number]     *If you need refills on other medications prior to your next appointment, please contact your pharmacy*  Follow-Up: Call back or seek an in-person evaluation if the symptoms worsen or if the condition fails to improve as anticipated.  [facility] [phone]  Other Instructions Sinus Infection, Adult A sinus infection is soreness and swelling (inflammation) of your sinuses. Sinuses are hollow spaces in the bones around your face. They are located: Around your eyes. In the middle of your forehead. Behind your nose. In your cheekbones. Your sinuses and nasal passages are lined with a fluid called mucus. Mucus drains out of your sinuses. Swelling can trap mucus in your sinuses. This lets germs (bacteria, virus, or fungus) grow, which leads to infection. Most of the time, this condition is caused by a virus. What are the causes? Allergies. Asthma. Germs. Things that block your nose or sinuses. Growths in the nose (nasal polyps). Chemicals or irritants in the air. A fungus. This is rare. What increases the  risk? Having a weak body defense system (immune system). Doing a lot of swimming or diving. Using nasal sprays too much. Smoking. What are the signs or symptoms? The main symptoms of this condition are pain and a feeling of pressure around the sinuses. Other symptoms include: Stuffy nose (congestion). This may make it hard to breathe through your nose. Runny nose (drainage). Soreness, swelling, and warmth in the sinuses. A cough that may get worse at night. Being unable to smell and taste. Mucus that collects in the throat or the back of the nose (postnasal drip). This may cause a sore throat or bad breath. Being very tired (fatigued). A fever. How is this diagnosed? Your symptoms. Your medical history. A physical exam. Tests to find out if your condition is short-term (acute) or long-term (chronic). Your doctor may: Check your nose for growths (polyps). Check your sinuses using a tool that has a light on one end (endoscope). Check for allergies or germs. Do imaging tests, such as an MRI or CT scan. How is this treated? Treatment for this condition depends on the cause and whether it is short-term or long-term. If caused by a virus, your symptoms should go away on their own within 10 days. You may be given medicines to relieve symptoms. They include: Medicines that shrink swollen tissue in the nose. A spray that treats swelling of the nostrils. Rinses that help get rid of thick mucus in your nose (nasal saline washes). Medicines that treat allergies (antihistamines). Over-the-counter pain relievers. If caused by bacteria, your doctor may wait to see if you will get better without treatment. You may be given antibiotic medicine if you have: A very bad infection. A weak body defense system. If caused by growths in the nose, surgery may be needed. Follow these instructions at home: Medicines Take, use, or apply over-the-counter and prescription medicines only as told by your  doctor. These may include nasal sprays. If you were prescribed an antibiotic medicine, take it as told by your doctor. Do not stop taking it even if you start to feel better. Hydrate and humidify  Drink enough water to keep your pee (urine) pale yellow. Use a cool mist humidifier to keep the humidity level in your home above 50%. Breathe in steam for 10-15 minutes, 3-4 times a day, or as told by your doctor. You can do this in the bathroom while a hot shower is running. Try not to spend time in cool or dry air. Rest Rest as much as you can. Sleep with your head raised (elevated). Make sure you get enough sleep each night. General instructions  Put a warm, moist washcloth on your face 3-4 times a day, or as often as told by your doctor. Use nasal saline washes  as often as told by your doctor. Wash your hands often with soap and water. If you cannot use soap and water, use hand sanitizer. Do not smoke. Avoid being around people who are smoking (secondhand smoke). Keep all follow-up visits. Contact a doctor if: You have a fever. Your symptoms get worse. Your symptoms do not get better within 10 days. Get help right away if: You have a very bad headache. You cannot stop vomiting. You have very bad pain or swelling around your face or eyes. You have trouble seeing. You feel confused. Your neck is stiff. You have trouble breathing. These symptoms may be an emergency. Get help right away. Call 911. Do not wait to see if the symptoms will go away. Do not drive yourself to the hospital. Summary A sinus infection is swelling of your sinuses. Sinuses are hollow spaces in the bones around your face. This condition is caused by tissues in your nose that become inflamed or swollen. This traps germs. These can lead to infection. If you were prescribed an antibiotic medicine, take it as told by your doctor. Do not stop taking it even if you start to feel better. Keep all follow-up visits. This  information is not intended to replace advice given to you by your health care provider. Make sure you discuss any questions you have with your health care provider. Document Revised: 10/23/2021 Document Reviewed: 10/23/2021 Elsevier Patient Education  [place].    If you have been instructed to have an in-person evaluation today at a local Urgent Care facility, please use the link below. It will take you to a list of all of our available [facility], including address, phone number and hours of operation. Please do not delay care.  [facility]  If you or a family member do not have a primary care provider, use the link below to schedule a visit and establish care. When you choose a [facility] primary care physician or advanced practice provider, you gain a long-term partner in health. Find a Primary Care Provider  Learn more about [facility]'s in-office and virtual care options: [facility]

## 2022-10-02 ENCOUNTER — Ambulatory Visit (INDEPENDENT_AMBULATORY_CARE_PROVIDER_SITE_OTHER): Payer: BC Managed Care – PPO

## 2022-10-02 ENCOUNTER — Ambulatory Visit: Payer: Self-pay

## 2022-10-02 ENCOUNTER — Ambulatory Visit
Admission: EM | Admit: 2022-10-02 | Discharge: 2022-10-02 | Disposition: A | Discharge status: Home / Self Care | Payer: BC Managed Care – PPO | Attending: Urgent Care | Admitting: Urgent Care

## 2022-10-02 ENCOUNTER — Encounter: Payer: Self-pay | Admitting: Emergency Medicine

## 2022-10-02 DIAGNOSIS — I509 Heart failure, unspecified: Secondary | ICD-10-CM | POA: Diagnosis not present

## 2022-10-02 DIAGNOSIS — R0989 Other specified symptoms and signs involving the circulatory and respiratory systems: Secondary | ICD-10-CM | POA: Diagnosis not present

## 2022-10-02 DIAGNOSIS — R053 Chronic cough: Secondary | ICD-10-CM

## 2022-10-02 DIAGNOSIS — R059 Cough, unspecified: Secondary | ICD-10-CM | POA: Diagnosis not present

## 2022-10-02 DIAGNOSIS — R609 Edema, unspecified: Secondary | ICD-10-CM

## 2022-10-02 MED ORDER — POTASSIUM CHLORIDE CRYS ER 20 MEQ PO TBCR: 20.0000 meq | EXTENDED_RELEASE_TABLET | Freq: Two times a day (BID) | ORAL | 0 refills | Status: DC

## 2022-10-02 MED ORDER — FUROSEMIDE 40 MG PO TABS: 80.0000 mg | ORAL_TABLET | Freq: Every day | ORAL | 0 refills | Status: DC

## 2022-10-02 NOTE — Telephone Encounter (Signed)
  Chief Complaint: continued harsh cough Symptoms: pt describes a "popping" sound with breathing, dizziness has to sleep sitting up Frequency: 3 weeks Pertinent Negatives: Patient denies chest pain, fever Disposition: '[]'$ ED /'[x]'$ Urgent Care (no appt availability in office) / '[]'$ Appointment(In office/virtual)/ '[]'$  [facility]/ '[]'$ Home Care/ '[]'$ Refused Recommended Disposition /'[]'$ [facility]/ '[]'$  Follow-up with PCP Additional Notes: pt has already taken 1 round of abx and cough and SOB continue- wants xray Reason for Disposition  [1] MILD difficulty breathing (e.g., minimal/no SOB at rest, SOB with walking, pulse <100) AND [2] still present when not coughing  Answer Assessment - Initial Assessment Questions 1. ONSET: "When did the cough begin?"      3 weeks 2. SEVERITY: "How bad is the cough today?"      Popping suund like  3. SPUTUM: "Describe the color of your sputum" (none, dry cough; clear, white, yellow, green)     *No Answer* 4. HEMOPTYSIS: "Are you coughing up any blood?" If so ask: "How much?" (flecks, streaks, tablespoons, etc.)     *No Answer* 5. DIFFICULTY BREATHING: "Are you having difficulty breathing?" If Yes, ask: "How bad is it?" (e.g., mild, moderate, severe)    - MILD: No SOB at rest, mild SOB with walking, speaks normally in sentences, can lie down, no retractions, pulse < 100.    - MODERATE: SOB at rest, SOB with minimal exertion and prefers to sit, cannot lie down flat, speaks in phrases, mild retractions, audible wheezing, pulse 100-120.    - SEVERE: Very SOB at rest, speaks in single words, struggling to breathe, sitting hunched forward, retractions, pulse > 120      Mild flat down dizzy 6. FEVER: "Do you have a fever?" If Yes, ask: "What is your temperature, how was it measured, and when did it start?"     no 7. CARDIAC HISTORY: "Do you have any history of heart disease?" (e.g., heart attack, congestive heart failure)      *No Answer* 8. LUNG HISTORY:  "Do you have any history of lung disease?"  (e.g., pulmonary embolus, asthma, emphysema)     *No Answer* 9. PE RISK FACTORS: "Do you have a history of blood clots?" (or: recent major surgery, recent prolonged travel, bedridden)     *No Answer* 10. OTHER SYMPTOMS: "Do you have any other symptoms?" (e.g., runny nose, wheezing, chest pain)       rattling 11. PREGNANCY: "Is there any chance you are pregnant?" "When was your last menstrual period?"       N/a 12. TRAVEL: "Have you traveled out of the country in the last month?" (e.g., travel history, exposures)       *No Answer*  Protocols used: Cough - Acute Productive-A-AH

## 2022-10-02 NOTE — ED Provider Notes (Signed)
[name]  Note:  This document was prepared using Systems analyst and may include unintentional dictation errors.  MRN: [id number] DOB: 1966/07/08  Subjective:   Parker [name] is a 56 y.o. male presenting for 3-week history of persistent coughing, crackling and popping in his lung sounds worse when he lays down with shortness of breath.  Has had 3 separate visits for this.  Was initially started on Augmentin and then advised to stop this.  He was placed on levofloxacin and a steroid course.  Thereafter he was restarted on Augmentin.  No imaging has been performed.  Patient has experienced no improvement or change in his symptoms.  Patient has a recent diagnosis of congestive heart failure with preserved ejection fraction.  This was following a stroke he had a month ago suspected to be secondary to atrial fibrillation.  He is currently taking Eliquis for this.  Has noticed that he has lower leg swelling.  Patient has previously taken furosemide but then was switched to torsemide when he was hospitalized.  No current facility-administered medications for this encounter.  Current Outpatient Medications:    acetaminophen (TYLENOL) 500 MG tablet, Take by mouth., Disp: , Rfl:    albuterol (VENTOLIN HFA) 108 (90 Base) MCG/ACT inhaler, Inhale 2 puffs into the lungs every 6 (six) hours as needed., Disp: , Rfl:    allopurinol (ZYLOPRIM) 100 MG tablet, Take by mouth., Disp: , Rfl:    amiodarone (PACERONE) 200 MG tablet, Take 200 mg by mouth daily., Disp: , Rfl:    benzonatate (TESSALON) 100 MG capsule, Take 1 capsule (100 mg total) by mouth 2 (two) times daily as needed for cough., Disp: 20 capsule, Rfl: 0   ELIQUIS 5 MG TABS tablet, Take 5 mg by mouth 2 (two) times daily., Disp: , Rfl:    fluticasone (FLONASE) 50 MCG/ACT nasal spray, Place 2 sprays into both nostrils daily., Disp: 16 g, Rfl: 0   folic acid (FOLVITE) 1 MG tablet, Take 1 mg by mouth daily.,  Disp: , Rfl:    folic acid (FOLVITE) 1 MG tablet, Take 1 tablet by mouth daily., Disp: , Rfl:    gabapentin (NEURONTIN) 300 MG capsule, Take 300 mg by mouth 3 (three) times daily., Disp: , Rfl:    levofloxacin (LEVAQUIN) 500 MG tablet, Take 500 mg by mouth daily., Disp: , Rfl:    levothyroxine (SYNTHROID) 50 MCG tablet, Take 50 mcg by mouth daily., Disp: , Rfl:    losartan (COZAAR) 50 MG tablet, Take 50 mg by mouth daily., Disp: , Rfl:    magnesium oxide (MAG-OX) 400 MG tablet, Take 1 tablet by mouth 2 (two) times daily., Disp: , Rfl:    meclizine (ANTIVERT) 25 MG tablet, Take 25 mg by mouth 3 (three) times daily as needed., Disp: , Rfl:    metoprolol succinate (TOPROL-XL) 50 MG 24 hr tablet, Take 50 mg by mouth daily., Disp: , Rfl:    Multiple Vitamins-Minerals (MULTIVITAMIN WITH MINERALS) tablet, Take 1 tablet by mouth daily., Disp: , Rfl:    NOREL AD 4-10-325 MG TABS, Take 1 tablet by mouth 2 (two) times daily as needed., Disp: , Rfl:    potassium chloride (KLOR-CON) 10 MEQ tablet, Take 10 mEq by mouth daily., Disp: , Rfl:    predniSONE (STERAPRED UNI-PAK 21 TAB) 10 MG (21) TBPK tablet, Take by mouth., Disp: , Rfl:    torsemide (DEMADEX) 20 MG tablet, Take 60 mg by mouth daily., Disp: , Rfl:  traZODone (DESYREL) 50 MG tablet, Take 25 mg by mouth at bedtime as needed., Disp: , Rfl:    amoxicillin (AMOXIL) 500 MG capsule, Take 500 mg by mouth 3 (three) times daily., Disp: , Rfl:    MAGNESIUM-OXIDE 400 (240 Mg) MG tablet, Take 400 mg by mouth 2 (two) times daily., Disp: , Rfl:    nitroGLYCERIN (NITROSTAT) 0.4 MG SL tablet, Place 1 tablet under the tongue every 5 (five) minutes as needed., Disp: , Rfl:     Allergies  Allergen Reactions   Iodinated Contrast Media Itching   Other Rash    Other reaction(s): Other (See Comments) Neoprene: Rash    Past Medical History:  Diagnosis Date   (HFpEF) heart failure with preserved ejection fraction (HCC)    Arthritis    knees. elbow, ankle    Diabetes mellitus without complication (HCC)    Dyspnea    when in A-fib   Dysrhythmia    A-fib   GERD (gastroesophageal reflux disease)    takes Dexilant prn   Hypertension    takes Lisinopril-HCTZ daily   Muscle spasm    takes Relafen daily   Peripheral edema    Plantar fasciitis    right     Past Surgical History:  Procedure Laterality Date   EAR CYST EXCISION N/A 08/06/2013   Procedure: CYST REMOVAL;  Surgeon: Ruby Cola, MD;  Location: [facility] OR;  Service: ENT;  Laterality: N/A;   ESOPHAGOGASTRODUODENOSCOPY     left knee arthroscopy  2003   PAROTIDECTOMY Right 08/06/2013   Procedure: PAROTIDECTOMY;  Surgeon: Ruby Cola, MD;  Location: [place];  Service: ENT;  Laterality: Right;   TONSILLECTOMY Bilateral 08/06/2013   Procedure: TONSILLECTOMY;  Surgeon: Ruby Cola, MD;  Location: [place];  Service: ENT;  Laterality: Bilateral;    History reviewed. No pertinent family history.  Social History   Tobacco Use   Smoking status: Never   Smokeless tobacco: Never  Vaping Use   Vaping Use: Never used  Substance Use Topics   Alcohol use: Yes    Alcohol/week: 6.0 standard drinks of alcohol    Types: 6 Standard drinks or equivalent per week    Comment: daily   Drug use: No    ROS   Objective:   Vitals: BP (!) 133/99 (BP Location: Left Arm)   Pulse (!) 105   Temp 98.2 F (36.8 C) (Oral)   Resp 16   SpO2 98%   Physical Exam Constitutional:      General: He is not in acute distress.    Appearance: Normal appearance. He is well-developed. He is obese. He is not ill-appearing, toxic-appearing or diaphoretic.  HENT:     Head: Normocephalic and atraumatic.     Right Ear: External ear normal.     Left Ear: External ear normal.     Nose: Nose normal.     Mouth/Throat:     Mouth: Mucous membranes are moist.  Eyes:     General: No scleral icterus.       Right eye: No discharge.        Left eye: No discharge.     Extraocular Movements: Extraocular movements  intact.  Cardiovascular:     Rate and Rhythm: Normal rate and regular rhythm.     Heart sounds: Normal heart sounds. No murmur heard.    No friction rub. No gallop.  Pulmonary:     Effort: Pulmonary effort is normal. No respiratory distress.     Breath sounds: No stridor.  No wheezing, rhonchi or rales.     Comments: Decreased lung sounds throughout which may be due to body habitus. Musculoskeletal:     Right lower leg: Edema (1+ bilaterally up to the knee) present.     Left lower leg: Edema (1+ bilaterally up to the knee) present.  Neurological:     Mental Status: He is alert and oriented to person, place, and time.     Cranial Nerves: No cranial nerve deficit.     Motor: No weakness.     Coordination: Coordination normal.     Gait: Gait normal.  Psychiatric:        Mood and Affect: Mood normal.        Behavior: Behavior normal.        Thought Content: Thought content normal.     DG Chest 2 View  Result Date: 10/02/2022 CLINICAL DATA:  Patient had a stroke a month ago, was told he had CHF during his stay. Began having a cough 3 weeks ago, was placed on antibiotics for it 1.5 weeks ago without improvement, was also placed on a steroid dose pack at that time. Pat.*comment was truncated*cough, shob EXAM: CHEST - 2 VIEW COMPARISON:  None Available. FINDINGS: Prominent cardiac silhouette. Mild increase distal venous congestion. No focal infiltrate. No pleural fluid. No pneumothorax. No acute osseous abnormality. IMPRESSION: Enlarged heart and central venous congestion. Electronically Signed   By: Suzy Bouchard M.D.   On: 10/02/2022 13:14    Assessment and Plan :   PDMP not reviewed this encounter.  1. Pulmonary vascular congestion   2. Persistent cough   3. Congestive heart failure, unspecified HF chronicity, unspecified heart failure type ([place])   4. Peripheral edema     Patient states that he has done very well with furosemide previously.  As such, recommended that he hold  torsemide, use furosemide at 80 mg once daily for the next 5 days.  Advised potassium supplementation at 20 mEq twice daily for the next 5 days as well.  Labs pending we will make adjustments to the latter based off of his results.  For now I do not see a reason that patient should be on an antibiotic but advised that he can finish the remainder of his Augmentin as instructed by his previous provider.  Maintain close follow-up with his regular doctor and cardiologist.  Restart torsemide following the Lasix course.  Counseled patient on potential for adverse effects with medications prescribed/recommended today, ER and return-to-clinic precautions discussed, patient verbalized understanding.    Jaynee Eagles, [place] 10/02/22 1932

## 2022-10-02 NOTE — Discharge Instructions (Addendum)
You can finish the amoxicillin/clavulanate if you would like to follow your previous provider's instructions.  Your chest x-ray in our clinic today does not support that you have a pneumonia.  There is vascular congestion which is more related to your congestive heart failure.  Please make sure you limit your fluid intake to no more than 32 ounces daily.  I would like for you to hold torsemide for now.  Start furosemide at 80 mg daily for the next 5 days.  I am providing you with a potassium supplement as well as diuretics can really decrease your potassium level.  I am checking your blood work today to make sure that these electrolytes are not completely out of balance.  If I receive your results and he requires that we decrease your potassium supplements we will let you know.  Make sure you follow-up with your cardiologist as soon as possible.

## 2022-10-02 NOTE — ED Triage Notes (Signed)
Patient had a stroke a month ago, was told he had CHF during his stay. Began having a cough 3 weeks ago, was placed on antibiotics for it 1.5 weeks ago without improvement, was also placed on a steroid dose pack at that time. Patient has a lot of pitting edema bilaterally, he's requesting an x-ray during his visit today.

## 2022-10-03 LAB — COMPREHENSIVE METABOLIC PANEL
ALT: 16 IU/L (ref 0–44)
AST: 27 IU/L (ref 0–40)
Albumin/Globulin Ratio: 1.6 (ref 1.2–2.2)
Albumin: 4.2 g/dL (ref 3.8–4.9)
Alkaline Phosphatase: 61 IU/L (ref 44–121)
BUN/Creatinine Ratio: 9 (ref 9–20)
BUN: 9 mg/dL (ref 6–24)
Bilirubin Total: 0.9 mg/dL (ref 0.0–1.2)
CO2: 23 mmol/L (ref 20–29)
Calcium: 9.3 mg/dL (ref 8.7–10.2)
Chloride: 103 mmol/L (ref 96–106)
Creatinine, Ser: 0.96 mg/dL (ref 0.76–1.27)
Globulin, Total: 2.6 g/dL (ref 1.5–4.5)
Glucose: 86 mg/dL (ref 70–99)
Potassium: 4.6 mmol/L (ref 3.5–5.2)
Sodium: 141 mmol/L (ref 134–144)
Total Protein: 6.8 g/dL (ref 6.0–8.5)
eGFR: 93 mL/min/{1.73_m2} (ref >59)

## 2022-10-03 LAB — CBC WITH DIFFERENTIAL/PLATELET
Basophils Absolute: 0.1 10*3/uL (ref 0.0–0.2)
Basos: 2 %
EOS (ABSOLUTE): 0.2 10*3/uL (ref 0.0–0.4)
Eos: 3 %
Hematocrit: 42.2 % (ref 37.5–51.0)
Hemoglobin: 13.2 g/dL (ref 13.0–17.7)
Immature Grans (Abs): 0.1 10*3/uL (ref 0.0–0.1)
Immature Granulocytes: 1 %
Lymphocytes Absolute: 2.3 10*3/uL (ref 0.7–3.1)
Lymphs: 34 %
MCH: 22.8 pg — ABNORMAL LOW (ref 26.6–33.0)
MCHC: 31.3 g/dL — ABNORMAL LOW (ref 31.5–35.7)
MCV: 73 fL — ABNORMAL LOW (ref 79–97)
Monocytes Absolute: 0.8 10*3/uL (ref 0.1–0.9)
Monocytes: 12 %
Neutrophils Absolute: 3.3 10*3/uL (ref 1.4–7.0)
Neutrophils: 48 %
Platelets: 228 10*3/uL (ref 150–450)
RBC: 5.78 x10E6/uL (ref 4.14–5.80)
RDW: 20.1 % — ABNORMAL HIGH (ref 11.6–15.4)
WBC: 6.8 10*3/uL (ref 3.4–10.8)

## 2023-01-08 DIAGNOSIS — R7303 Prediabetes: Secondary | ICD-10-CM | POA: Insufficient documentation

## 2023-01-09 DIAGNOSIS — R600 Localized edema: Secondary | ICD-10-CM | POA: Insufficient documentation

## 2023-01-23 ENCOUNTER — Emergency Department (HOSPITAL_COMMUNITY): Payer: PPO

## 2023-01-23 ENCOUNTER — Encounter (HOSPITAL_COMMUNITY): Payer: Self-pay

## 2023-01-23 ENCOUNTER — Other Ambulatory Visit: Payer: Self-pay

## 2023-01-23 ENCOUNTER — Ambulatory Visit
Admission: EM | Admit: 2023-01-23 | Discharge: 2023-01-23 | Discharge status: Against Medical Advice | Payer: PPO | Attending: Urgent Care | Admitting: Urgent Care

## 2023-01-23 ENCOUNTER — Inpatient Hospital Stay (HOSPITAL_COMMUNITY)
Admission: EM | Admit: 2023-01-23 | Discharge: 2023-01-25 | DRG: 292 | Disposition: A | Discharge status: Home / Self Care | Payer: PPO | Attending: Internal Medicine | Admitting: Internal Medicine

## 2023-01-23 DIAGNOSIS — M79642 Pain in left hand: Secondary | ICD-10-CM

## 2023-01-23 DIAGNOSIS — I5023 Acute on chronic systolic (congestive) heart failure: Secondary | ICD-10-CM | POA: Diagnosis not present

## 2023-01-23 DIAGNOSIS — K219 Gastro-esophageal reflux disease without esophagitis: Secondary | ICD-10-CM | POA: Diagnosis present

## 2023-01-23 DIAGNOSIS — N179 Acute kidney failure, unspecified: Secondary | ICD-10-CM

## 2023-01-23 DIAGNOSIS — I1A0 Resistant hypertension: Secondary | ICD-10-CM | POA: Diagnosis present

## 2023-01-23 DIAGNOSIS — Z6841 Body Mass Index (BMI) 40.0 and over, adult: Secondary | ICD-10-CM | POA: Diagnosis not present

## 2023-01-23 DIAGNOSIS — I5082 Biventricular heart failure: Secondary | ICD-10-CM | POA: Diagnosis present

## 2023-01-23 DIAGNOSIS — I480 Paroxysmal atrial fibrillation: Secondary | ICD-10-CM | POA: Diagnosis present

## 2023-01-23 DIAGNOSIS — I890 Lymphedema, not elsewhere classified: Secondary | ICD-10-CM | POA: Diagnosis present

## 2023-01-23 DIAGNOSIS — Z79890 Hormone replacement therapy (postmenopausal): Secondary | ICD-10-CM | POA: Diagnosis not present

## 2023-01-23 DIAGNOSIS — I509 Heart failure, unspecified: Secondary | ICD-10-CM

## 2023-01-23 DIAGNOSIS — R000 Tachycardia, unspecified: Secondary | ICD-10-CM

## 2023-01-23 DIAGNOSIS — Z79899 Other long term (current) drug therapy: Secondary | ICD-10-CM | POA: Diagnosis not present

## 2023-01-23 DIAGNOSIS — I421 Obstructive hypertrophic cardiomyopathy: Secondary | ICD-10-CM | POA: Diagnosis present

## 2023-01-23 DIAGNOSIS — I2489 Other forms of acute ischemic heart disease: Secondary | ICD-10-CM | POA: Diagnosis present

## 2023-01-23 DIAGNOSIS — I5031 Acute diastolic (congestive) heart failure: Secondary | ICD-10-CM | POA: Diagnosis not present

## 2023-01-23 DIAGNOSIS — I130 Hypertensive heart and chronic kidney disease with heart failure and stage 1 through stage 4 chronic kidney disease, or unspecified chronic kidney disease: Secondary | ICD-10-CM | POA: Diagnosis not present

## 2023-01-23 DIAGNOSIS — I5033 Acute on chronic diastolic (congestive) heart failure: Secondary | ICD-10-CM

## 2023-01-23 DIAGNOSIS — I4891 Unspecified atrial fibrillation: Secondary | ICD-10-CM | POA: Diagnosis present

## 2023-01-23 DIAGNOSIS — Z7901 Long term (current) use of anticoagulants: Secondary | ICD-10-CM

## 2023-01-23 DIAGNOSIS — N1831 Chronic kidney disease, stage 3a: Secondary | ICD-10-CM | POA: Diagnosis present

## 2023-01-23 DIAGNOSIS — I2510 Atherosclerotic heart disease of native coronary artery without angina pectoris: Secondary | ICD-10-CM | POA: Diagnosis present

## 2023-01-23 DIAGNOSIS — M109 Gout, unspecified: Secondary | ICD-10-CM | POA: Diagnosis present

## 2023-01-23 DIAGNOSIS — Z91199 Patient's noncompliance with other medical treatment and regimen due to unspecified reason: Secondary | ICD-10-CM | POA: Diagnosis not present

## 2023-01-23 DIAGNOSIS — R609 Edema, unspecified: Secondary | ICD-10-CM | POA: Diagnosis not present

## 2023-01-23 DIAGNOSIS — E039 Hypothyroidism, unspecified: Secondary | ICD-10-CM | POA: Diagnosis present

## 2023-01-23 DIAGNOSIS — G4733 Obstructive sleep apnea (adult) (pediatric): Secondary | ICD-10-CM | POA: Diagnosis present

## 2023-01-23 DIAGNOSIS — E66813 Obesity, class 3: Secondary | ICD-10-CM | POA: Diagnosis present

## 2023-01-23 DIAGNOSIS — I50813 Acute on chronic right heart failure: Secondary | ICD-10-CM | POA: Diagnosis present

## 2023-01-23 DIAGNOSIS — R0602 Shortness of breath: Secondary | ICD-10-CM | POA: Diagnosis not present

## 2023-01-23 DIAGNOSIS — Z7952 Long term (current) use of systemic steroids: Secondary | ICD-10-CM

## 2023-01-23 DIAGNOSIS — I10 Essential (primary) hypertension: Secondary | ICD-10-CM | POA: Insufficient documentation

## 2023-01-23 DIAGNOSIS — I5032 Chronic diastolic (congestive) heart failure: Secondary | ICD-10-CM | POA: Diagnosis present

## 2023-01-23 DIAGNOSIS — E1122 Type 2 diabetes mellitus with diabetic chronic kidney disease: Secondary | ICD-10-CM | POA: Diagnosis present

## 2023-01-23 DIAGNOSIS — E119 Type 2 diabetes mellitus without complications: Secondary | ICD-10-CM

## 2023-01-23 DIAGNOSIS — R600 Localized edema: Secondary | ICD-10-CM

## 2023-01-23 HISTORY — DX: Acute kidney failure, unspecified: N17.9

## 2023-01-23 LAB — TROPONIN I (HIGH SENSITIVITY)
Troponin I (High Sensitivity): 35 ng/L — ABNORMAL HIGH (ref <18)
Troponin I (High Sensitivity): 31 ng/L — ABNORMAL HIGH (ref <18)

## 2023-01-23 LAB — CBC
HCT: 37.2 % — ABNORMAL LOW (ref 39.0–52.0)
HCT: 38 % — ABNORMAL LOW (ref 39.0–52.0)
Hemoglobin: 11.8 g/dL — ABNORMAL LOW (ref 13.0–17.0)
Hemoglobin: 12.4 g/dL — ABNORMAL LOW (ref 13.0–17.0)
MCH: 21.5 pg — ABNORMAL LOW (ref 26.0–34.0)
MCH: 22 pg — ABNORMAL LOW (ref 26.0–34.0)
MCHC: 31.7 g/dL (ref 30.0–36.0)
MCHC: 32.6 g/dL (ref 30.0–36.0)
MCV: 67.9 fL — ABNORMAL LOW (ref 80.0–100.0)
MCV: 67.4 fL — ABNORMAL LOW (ref 80.0–100.0)
Platelets: 228 10*3/uL (ref 150–400)
Platelets: 231 10*3/uL (ref 150–400)
RBC: 5.48 MIL/uL (ref 4.22–5.81)
RBC: 5.64 MIL/uL (ref 4.22–5.81)
RDW: 19.7 % — ABNORMAL HIGH (ref 11.5–15.5)
RDW: 20.1 % — ABNORMAL HIGH (ref 11.5–15.5)
WBC: 6.5 10*3/uL (ref 4.0–10.5)
WBC: 7 10*3/uL (ref 4.0–10.5)
nRBC: 0 % (ref 0.0–0.2)
nRBC: 0 % (ref 0.0–0.2)

## 2023-01-23 LAB — COMPREHENSIVE METABOLIC PANEL
ALT: 12 U/L (ref 0–44)
AST: 24 U/L (ref 15–41)
Albumin: 3.6 g/dL (ref 3.5–5.0)
Alkaline Phosphatase: 60 U/L (ref 38–126)
Anion gap: 15 (ref 5–15)
BUN: 20 mg/dL (ref 6–20)
CO2: 21 mmol/L — ABNORMAL LOW (ref 22–32)
Calcium: 8.8 mg/dL — ABNORMAL LOW (ref 8.9–10.3)
Chloride: 101 mmol/L (ref 98–111)
Creatinine, Ser: 1.41 mg/dL — ABNORMAL HIGH (ref 0.61–1.24)
GFR, Estimated: 58 mL/min — ABNORMAL LOW (ref >60)
Glucose, Bld: 102 mg/dL — ABNORMAL HIGH (ref 70–99)
Potassium: 3.6 mmol/L (ref 3.5–5.1)
Sodium: 137 mmol/L (ref 135–145)
Total Bilirubin: 1.2 mg/dL (ref 0.3–1.2)
Total Protein: 7.4 g/dL (ref 6.5–8.1)

## 2023-01-23 LAB — CREATININE, SERUM
Creatinine, Ser: 1.39 mg/dL — ABNORMAL HIGH (ref 0.61–1.24)
GFR, Estimated: 59 mL/min — ABNORMAL LOW (ref >60)

## 2023-01-23 LAB — MAGNESIUM: Magnesium: 1.7 mg/dL (ref 1.7–2.4)

## 2023-01-23 LAB — BRAIN NATRIURETIC PEPTIDE: B Natriuretic Peptide: 377.4 pg/mL — ABNORMAL HIGH (ref 0.0–100.0)

## 2023-01-23 LAB — D-DIMER, QUANTITATIVE: D-Dimer, Quant: 10.31 ug/mL-FEU — ABNORMAL HIGH (ref 0.00–0.50)

## 2023-01-23 MED ORDER — SODIUM CHLORIDE 0.9% FLUSH: 3.0000 mL | INTRAVENOUS | Status: DC | PRN

## 2023-01-23 MED ORDER — OXYCODONE HCL 5 MG PO TABS: 5.0000 mg | ORAL_TABLET | ORAL | Status: DC | PRN

## 2023-01-23 MED ORDER — ONDANSETRON HCL 4 MG/2ML IJ SOLN: 4.0000 mg | Freq: Four times a day (QID) | INTRAMUSCULAR | Status: DC | PRN

## 2023-01-23 MED ORDER — AMIODARONE HCL 200 MG PO TABS
200.0000 mg | ORAL_TABLET | Freq: Every day | ORAL | Status: DC
  Administered 2023-01-24 – 2023-01-25 (×2): 200 mg via ORAL
  Filled 2023-01-23 (×2): qty 1

## 2023-01-23 MED ORDER — ENOXAPARIN SODIUM 40 MG/0.4ML IJ SOSY: 40.0000 mg | PREFILLED_SYRINGE | INTRAMUSCULAR | Status: DC

## 2023-01-23 MED ORDER — ACETAMINOPHEN 325 MG PO TABS: 650.0000 mg | ORAL_TABLET | ORAL | Status: DC | PRN

## 2023-01-23 MED ORDER — METOPROLOL SUCCINATE ER 50 MG PO TB24
50.0000 mg | ORAL_TABLET | Freq: Every day | ORAL | Status: DC
  Administered 2023-01-24 – 2023-01-25 (×2): 50 mg via ORAL
  Filled 2023-01-23: qty 2
  Filled 2023-01-23: qty 1

## 2023-01-23 MED ORDER — SODIUM CHLORIDE 0.9 % IV SOLN: 250.0000 mL | INTRAVENOUS | Status: DC | PRN

## 2023-01-23 MED ORDER — NITROGLYCERIN 0.4 MG SL SUBL: 0.4000 mg | SUBLINGUAL_TABLET | SUBLINGUAL | Status: DC | PRN

## 2023-01-23 MED ORDER — APIXABAN 5 MG PO TABS
5.0000 mg | ORAL_TABLET | Freq: Two times a day (BID) | ORAL | Status: DC
  Administered 2023-01-24 – 2023-01-25 (×4): 5 mg via ORAL
  Filled 2023-01-23 (×4): qty 1

## 2023-01-23 MED ORDER — MORPHINE SULFATE (PF) 2 MG/ML IV SOLN: 1.0000 mg | INTRAVENOUS | Status: DC | PRN

## 2023-01-23 MED ORDER — SODIUM CHLORIDE 0.9% FLUSH
3.0000 mL | Freq: Two times a day (BID) | INTRAVENOUS | Status: DC
  Administered 2023-01-24 – 2023-01-25 (×4): 3 mL via INTRAVENOUS

## 2023-01-23 MED ORDER — FUROSEMIDE 10 MG/ML IJ SOLN
40.0000 mg | Freq: Two times a day (BID) | INTRAMUSCULAR | Status: DC
  Administered 2023-01-24 – 2023-01-25 (×3): 40 mg via INTRAVENOUS
  Filled 2023-01-23 (×3): qty 4

## 2023-01-23 MED ORDER — METHYLPREDNISOLONE SODIUM SUCC 40 MG IJ SOLR
20.0000 mg | Freq: Two times a day (BID) | INTRAMUSCULAR | Status: DC
  Administered 2023-01-24 – 2023-01-25 (×4): 20 mg via INTRAVENOUS
  Filled 2023-01-23 (×5): qty 1

## 2023-01-23 MED ORDER — LEVOTHYROXINE SODIUM 50 MCG PO TABS
50.0000 ug | ORAL_TABLET | Freq: Every day | ORAL | Status: DC
  Administered 2023-01-24 – 2023-01-25 (×2): 50 ug via ORAL
  Filled 2023-01-23: qty 2
  Filled 2023-01-23: qty 1

## 2023-01-23 MED ORDER — TRAZODONE HCL 50 MG PO TABS
25.0000 mg | ORAL_TABLET | Freq: Every evening | ORAL | Status: DC | PRN
  Administered 2023-01-24: 25 mg via ORAL
  Filled 2023-01-23: qty 1

## 2023-01-23 MED ORDER — GABAPENTIN 300 MG PO CAPS: 300.0000 mg | ORAL_CAPSULE | Freq: Three times a day (TID) | ORAL | Status: DC

## 2023-01-23 MED ORDER — FUROSEMIDE 10 MG/ML IJ SOLN
40.0000 mg | Freq: Once | INTRAMUSCULAR | Status: AC
  Administered 2023-01-23: 40 mg via INTRAVENOUS
  Filled 2023-01-23: qty 4

## 2023-01-23 MED ORDER — FOLIC ACID 1 MG PO TABS
1.0000 mg | ORAL_TABLET | Freq: Every day | ORAL | Status: DC
  Administered 2023-01-24 – 2023-01-25 (×2): 1 mg via ORAL
  Filled 2023-01-23 (×2): qty 1

## 2023-01-23 NOTE — ED Provider Triage Note (Signed)
Emergency Medicine Provider Triage Evaluation Note  Parker [name] , a 57 y.o. male  was evaluated in triage.  Pt complains of patient comes in with exertional shortness of breath, orthopnea, peripheral edema progressively worsening over the past 5 days.  Endorses PND but states this is at baseline.  Denies current chest pain.  Denies recent salty meals.  Reports compliance with home diuretic.  Review of Systems  Positive: As above Negative: As above  Physical Exam  BP (!) 149/104   Pulse (!) 128   Temp 98.2 F (36.8 C) (Oral)   Resp 17   Ht '5\' 11"'$  (1.803 m)   Wt 130.2 kg   SpO2 100%   BMI 40.03 kg/m  Gen:   Awake, no distress   Resp:  Normal effort  MSK:   Moves extremities without difficulty  Other:    Medical Decision Making  Medically screening exam initiated at 5:07 PM.  Appropriate orders placed.  Parker [name] was informed that the remainder of the evaluation will be completed by another provider, this initial triage assessment does not replace that evaluation, and the importance of remaining in the ED until their evaluation is complete.     Evlyn Courier, PA-C 01/23/23 1708

## 2023-01-23 NOTE — H&P (Addendum)
PCP:   Pcp, No   Chief Complaint:  Pain left hand  HPI: This is a 57 year old male with past medical history of morbid obesity, gout, HFpEF, diabetes mellitus, atrial fibrillation, and hypothyroidism.  He presents with complaints of left arm pain and weight gain.  Per patient he has gained 20 to 25 pounds in the last 6 months.  He has marked orthopnea and is unable to lay flat.  He is very short of breath mostly at nighttime, adding he does not feel dyspneic during the day when he is up and about.  He states his orthopnea is worse when his atrial fibrillation is off.  He states he does not feel palpitation unless his heart rate is in the 160s to 180s.  Here in the ER patient's heart rate is in the 140s, he states this is typical for him.  Per patient he is compliant with his medications however he does endorse some increased water intake. Proximately 4 days ago he developed pain in his left forearm, hand and feet.  He states the pain is excruciating.  2 days ago the pain moved primarily to his hands which is also swollen.  This is not typical for his gout has his gout is typically in his right foot great toe.  He endorses significant discomfort on the top of his right foot.  Per patient he has not taken his amiodarone today and he has not recently taken his allopurinol.  He went to urgent care was redirected to the ER.   Review of Systems:  The patient denies anorexia, fever, weight loss,, vision loss, decreased hearing, hoarseness, chest pain, syncope, dyspnea on exertion, peripheral edema, balance deficits, hemoptysis, abdominal pain, melena, hematochezia, severe indigestion/heartburn, hematuria, incontinence, genital sores, muscle weakness, suspicious skin lesions, transient blindness, difficulty walking, depression, unusual weight change, abnormal bleeding, enlarged lymph nodes, angioedema, and breast masses. Positives: Orthopnea, fluid retention, left arm and hand pain, right great toe pain  Past  Medical History: Past Medical History:  Diagnosis Date   (HFpEF) heart failure with preserved ejection fraction (HCC)    Arthritis    knees. elbow, ankle   Diabetes mellitus without complication (HCC)    Dyspnea    when in A-fib   Dysrhythmia    A-fib   GERD (gastroesophageal reflux disease)    takes Dexilant prn   Hypertension    takes Lisinopril-HCTZ daily   Muscle spasm    takes Relafen daily   Peripheral edema    Plantar fasciitis    right   Past Surgical History:  Procedure Laterality Date   EAR CYST EXCISION N/A 08/06/2013   Procedure: CYST REMOVAL;  Surgeon: Ruby Cola, MD;  Location: [facility] OR;  Service: ENT;  Laterality: N/A;   ESOPHAGOGASTRODUODENOSCOPY     left knee arthroscopy  2003   PAROTIDECTOMY Right 08/06/2013   Procedure: PAROTIDECTOMY;  Surgeon: Ruby Cola, MD;  Location: [place];  Service: ENT;  Laterality: Right;   TONSILLECTOMY Bilateral 08/06/2013   Procedure: TONSILLECTOMY;  Surgeon: Ruby Cola, MD;  Location: [place];  Service: ENT;  Laterality: Bilateral;    Medications: Prior to Admission medications   Medication Sig Start Date End Date Taking? Authorizing Provider  acetaminophen (TYLENOL) 500 MG tablet Take by mouth.    [provider]  albuterol (VENTOLIN HFA) 108 (90 Base) MCG/ACT inhaler Inhale 2 puffs into the lungs every 6 (six) hours as needed. 09/18/22   [provider]  allopurinol (ZYLOPRIM) 100 MG tablet Take by mouth.  [provider]  amiodarone (PACERONE) 200 MG tablet Take 200 mg by mouth daily. 09/20/21   [provider]  amoxicillin (AMOXIL) 500 MG capsule Take 500 mg by mouth 3 (three) times daily. 05/22/22   [provider]  benzonatate (TESSALON) 100 MG capsule Take 1 capsule (100 mg total) by mouth 2 (two) times daily as needed for cough. 09/16/22   Perlie Mayo, NP  ELIQUIS 5 MG TABS tablet Take 5 mg by mouth 2 (two) times daily. 08/26/22   [provider]  fluticasone  (FLONASE) 50 MCG/ACT nasal spray Place 2 sprays into both nostrils daily. 09/16/22   Perlie Mayo, NP  folic acid (FOLVITE) 1 MG tablet Take 1 mg by mouth daily. 10/16/21   [provider]  folic acid (FOLVITE) 1 MG tablet Take 1 tablet by mouth daily. 07/07/22   [provider]  furosemide (LASIX) 40 MG tablet Take 2 tablets (80 mg total) by mouth daily. 10/02/22   Jaynee Eagles, PA-C  gabapentin (NEURONTIN) 300 MG capsule Take 300 mg by mouth 3 (three) times daily. 12/26/21   [provider]  levofloxacin (LEVAQUIN) 500 MG tablet Take 500 mg by mouth daily. 09/18/22   [provider]  levothyroxine (SYNTHROID) 50 MCG tablet Take 50 mcg by mouth daily. 09/11/22   [provider]  losartan (COZAAR) 50 MG tablet Take 50 mg by mouth daily. 08/28/22   [provider]  magnesium oxide (MAG-OX) 400 MG tablet Take 1 tablet by mouth 2 (two) times daily. 06/07/22   [provider]  MAGNESIUM-OXIDE 400 (240 Mg) MG tablet Take 400 mg by mouth 2 (two) times daily. 09/17/21   [provider]  meclizine (ANTIVERT) 25 MG tablet Take 25 mg by mouth 3 (three) times daily as needed. 07/07/22   [provider]  metoprolol succinate (TOPROL-XL) 50 MG 24 hr tablet Take 50 mg by mouth daily. 08/16/22   [provider]  Multiple Vitamins-Minerals (MULTIVITAMIN WITH MINERALS) tablet Take 1 tablet by mouth daily.    [provider]  nitroGLYCERIN (NITROSTAT) 0.4 MG SL tablet Place 1 tablet under the tongue every 5 (five) minutes as needed. 04/09/21   [provider]  NOREL AD 4-10-325 MG TABS Take 1 tablet by mouth 2 (two) times daily as needed. 09/16/22   [provider]  potassium chloride (KLOR-CON) 10 MEQ tablet Take 10 mEq by mouth daily. 05/16/22   [provider]  potassium chloride SA (KLOR-CON M) 20 MEQ tablet Take 1 tablet (20 mEq total) by mouth 2 (two) times daily. 10/02/22   Jaynee Eagles, PA-C   predniSONE (STERAPRED UNI-PAK 21 TAB) 10 MG (21) TBPK tablet Take by mouth. 09/18/22   [provider]  torsemide (DEMADEX) 20 MG tablet Take 60 mg by mouth daily. 12/25/21   [provider]  traZODone (DESYREL) 50 MG tablet Take 25 mg by mouth at bedtime as needed. 08/28/22   [provider]    Allergies:   Allergies  Allergen Reactions   Iodinated Contrast Media Itching   Other Rash    Other reaction(s): Other (See Comments) Neoprene: Rash    Social History:  reports that he has never smoked. He has never used smokeless tobacco. He reports current alcohol use of about 6.0 standard drinks of alcohol per week. He reports that he does not use drugs.  Family History: History reviewed. No pertinent family history.  Physical Exam: Vitals:   01/23/23 2047 01/23/23 2049 01/23/23 2100  01/23/23 2214  BP:    (!) 131/95  Pulse:   (!) 108 (!) 57  Resp:   20 18  Temp:  98.6 F (37 C)    TempSrc:  Oral    SpO2:   100% 99%  Weight: 135 kg     Height: '5\' 11"'$  (1.803 m)       General:  Alert and oriented times three, morbidly obese, no acute distress Eyes: PERRLA, pink conjunctiva, no scleral icterus ENT: Moist oral mucosa, neck supple, no thyromegaly Lungs: clear to ascultation, no wheeze, no crackles, no use of accessory muscles Cardiovascular: Tachycardic, irregular rate and irregular rhythm, no regurgitation, no gallops, no murmurs. no JVD Abdomen: soft, positive BS, non-tender, non-distended, no organomegaly, not an acute abdomen GU: not examined Neuro: CN II - XII grossly intact, sensation intact Musculoskeletal: strength 5/5 all extremities, positive edema and lymphangitis.  Left wrist swollen and tender to touch.  Right foot superficial surface over the great toe mildly erythematous but not very warm, TTP Skin: no rash, no subcutaneous crepitation, no decubitus Psych: appropriate patient   Labs on Admission:  Recent Labs    01/23/23 1710  NA 137   K 3.6  CL 101  CO2 21*  GLUCOSE 102*  BUN 20  CREATININE 1.41*  CALCIUM 8.8*  MG 1.7   Recent Labs    01/23/23 1710  AST 24  ALT 12  ALKPHOS 60  BILITOT 1.2  PROT 7.4  ALBUMIN 3.6    Recent Labs    01/23/23 1710  WBC 6.5  HGB 11.8*  HCT 37.2*  MCV 67.9*  PLT 228     Radiological Exams on Admission: DG Hand 2 View Left  Result Date: 01/23/2023 CLINICAL DATA:  Second and third digit swelling EXAM: LEFT HAND - 2 VIEW COMPARISON:  None Available. FINDINGS: Frontal and lateral views of the left hand are obtained. No fracture, subluxation, or dislocation. Mild joint space narrowing within the metacarpophalangeal and interphalangeal joints. There is dorsal soft tissue swelling throughout the hand. IMPRESSION: 1. Mild osteoarthritis. 2. Dorsal soft tissue swelling. 3. No acute fracture. Electronically Signed   By: Randa Ngo M.D.   On: 01/23/2023 21:25   DG Chest 2 View  Result Date: 01/23/2023 CLINICAL DATA:  Left chest pain, cough, short of breath, nausea EXAM: CHEST - 2 VIEW COMPARISON:  10/02/2022 FINDINGS: Frontal and lateral views of the chest demonstrates stable enlargement of the cardiac silhouette. Chronic central vascular congestion without airspace disease, effusion, or pneumothorax. No acute bony abnormalities. IMPRESSION: 1. Stable enlarged cardiac silhouette and central vascular congestion. 2. No acute airspace disease. Electronically Signed   By: Randa Ngo M.D.   On: 01/23/2023 18:01    Assessment/Plan Present on Admission:  Acute on chronic congestive heart failure exacerbation -CHF order set initiated -IV Lasix twice daily, daily weights, strict I's and O's -Oxygen to keep sats greater than 88% if needed -2D echo ordered -Probably due to increased fluid intake -Cardiology consult placed   AKI (acute kidney injury) ([place]) -Avoid nephrotoxic medications.  Monitor closely as patient is on IV Lasix   Atrial fibrillation with RVR (HCC) -Amiodarone  200 mg daily ordered, first dose now -Resume metoprolol 50 mg daily in a.m..  PRN Lopressor for HR >  120 -Eliquis twice daily -Monitor on telemetry.  Acute gouty flare, left wrist & right fourth -IV Solu-Medrol 20 mg every 12 -Neither NSAIDs nor colchicine ordered with patient's AKI -As needed pain meds -X-ray right foot in  a.m.  Hypertension -Cozaar on hold with AKI -Metoprolol resumed  Hypothyroidism -Synthroid resumed, TSH in a.m.  Parker [name] 01/23/2023, 10:29 PM

## 2023-01-23 NOTE — ED Triage Notes (Signed)
Pt c/o pain/swelling to RLE and left hand-also c/o abd swelling/pain x 4-5 days-reports chronic bilat LLE-NAD-limping gait

## 2023-01-23 NOTE — ED Provider Notes (Signed)
[name]  Note:  This document was prepared using Systems analyst and may include unintentional dictation errors.  MRN: [id number] DOB: 07/03/1966  Subjective:   Parker [name] is a 57 y.o. male presenting for 4 to 5-day history of pain and swelling of the lower extremities, swelling of the abdomen, left hand swelling with pain.  Patient has chronic congestive heart failure.  He has previously come here before and we advised to use furosemide as torsemide was not working.  After contacting his regular doctor and cardiologist he did not take the furosemide and ended up using torsemide anyway.  Denies any chest pain but reports intermittent shortness of breath.  Has a history of atrial fibrillation.  He is on amiodarone, Eliquis and still takes torsemide.  No current facility-administered medications for this encounter.  Current Outpatient Medications:    acetaminophen (TYLENOL) 500 MG tablet, Take by mouth., Disp: , Rfl:    albuterol (VENTOLIN HFA) 108 (90 Base) MCG/ACT inhaler, Inhale 2 puffs into the lungs every 6 (six) hours as needed., Disp: , Rfl:    allopurinol (ZYLOPRIM) 100 MG tablet, Take by mouth., Disp: , Rfl:    amiodarone (PACERONE) 200 MG tablet, Take 200 mg by mouth daily., Disp: , Rfl:    amoxicillin (AMOXIL) 500 MG capsule, Take 500 mg by mouth 3 (three) times daily., Disp: , Rfl:    benzonatate (TESSALON) 100 MG capsule, Take 1 capsule (100 mg total) by mouth 2 (two) times daily as needed for cough., Disp: 20 capsule, Rfl: 0   ELIQUIS 5 MG TABS tablet, Take 5 mg by mouth 2 (two) times daily., Disp: , Rfl:    fluticasone (FLONASE) 50 MCG/ACT nasal spray, Place 2 sprays into both nostrils daily., Disp: 16 g, Rfl: 0   folic acid (FOLVITE) 1 MG tablet, Take 1 mg by mouth daily., Disp: , Rfl:    folic acid (FOLVITE) 1 MG tablet, Take 1 tablet by mouth daily., Disp: , Rfl:    furosemide (LASIX) 40 MG tablet, Take 2 tablets (80 mg  total) by mouth daily., Disp: 10 tablet, Rfl: 0   gabapentin (NEURONTIN) 300 MG capsule, Take 300 mg by mouth 3 (three) times daily., Disp: , Rfl:    levofloxacin (LEVAQUIN) 500 MG tablet, Take 500 mg by mouth daily., Disp: , Rfl:    levothyroxine (SYNTHROID) 50 MCG tablet, Take 50 mcg by mouth daily., Disp: , Rfl:    losartan (COZAAR) 50 MG tablet, Take 50 mg by mouth daily., Disp: , Rfl:    magnesium oxide (MAG-OX) 400 MG tablet, Take 1 tablet by mouth 2 (two) times daily., Disp: , Rfl:    MAGNESIUM-OXIDE 400 (240 Mg) MG tablet, Take 400 mg by mouth 2 (two) times daily., Disp: , Rfl:    meclizine (ANTIVERT) 25 MG tablet, Take 25 mg by mouth 3 (three) times daily as needed., Disp: , Rfl:    metoprolol succinate (TOPROL-XL) 50 MG 24 hr tablet, Take 50 mg by mouth daily., Disp: , Rfl:    Multiple Vitamins-Minerals (MULTIVITAMIN WITH MINERALS) tablet, Take 1 tablet by mouth daily., Disp: , Rfl:    nitroGLYCERIN (NITROSTAT) 0.4 MG SL tablet, Place 1 tablet under the tongue every 5 (five) minutes as needed., Disp: , Rfl:    NOREL AD 4-10-325 MG TABS, Take 1 tablet by mouth 2 (two) times daily as needed., Disp: , Rfl:    potassium chloride (KLOR-CON) 10 MEQ tablet, Take 10 mEq by mouth daily.,  Disp: , Rfl:    potassium chloride SA (KLOR-CON M) 20 MEQ tablet, Take 1 tablet (20 mEq total) by mouth 2 (two) times daily., Disp: 10 tablet, Rfl: 0   predniSONE (STERAPRED UNI-PAK 21 TAB) 10 MG (21) TBPK tablet, Take by mouth., Disp: , Rfl:    torsemide (DEMADEX) 20 MG tablet, Take 60 mg by mouth daily., Disp: , Rfl:    traZODone (DESYREL) 50 MG tablet, Take 25 mg by mouth at bedtime as needed., Disp: , Rfl:    Allergies  Allergen Reactions   Iodinated Contrast Media Itching   Other Rash    Other reaction(s): Other (See Comments) Neoprene: Rash    Past Medical History:  Diagnosis Date   (HFpEF) heart failure with preserved ejection fraction (HCC)    Arthritis    knees. elbow, ankle   Diabetes  mellitus without complication (HCC)    Dyspnea    when in A-fib   Dysrhythmia    A-fib   GERD (gastroesophageal reflux disease)    takes Dexilant prn   Hypertension    takes Lisinopril-HCTZ daily   Muscle spasm    takes Relafen daily   Peripheral edema    Plantar fasciitis    right     Past Surgical History:  Procedure Laterality Date   EAR CYST EXCISION N/A 08/06/2013   Procedure: CYST REMOVAL;  Surgeon: Ruby Cola, MD;  Location: [facility];  Service: ENT;  Laterality: N/A;   ESOPHAGOGASTRODUODENOSCOPY     left knee arthroscopy  2003   PAROTIDECTOMY Right 08/06/2013   Procedure: PAROTIDECTOMY;  Surgeon: Ruby Cola, MD;  Location: [place];  Service: ENT;  Laterality: Right;   TONSILLECTOMY Bilateral 08/06/2013   Procedure: TONSILLECTOMY;  Surgeon: Ruby Cola, MD;  Location: [place];  Service: ENT;  Laterality: Bilateral;    No family history on file.  Social History   Tobacco Use   Smoking status: Never   Smokeless tobacco: Never  Vaping Use   Vaping Use: Never used  Substance Use Topics   Alcohol use: Yes    Alcohol/week: 6.0 standard drinks of alcohol    Types: 6 Standard drinks or equivalent per week    Comment: daily   Drug use: No    ROS   Objective:   Vitals: BP 134/88 (BP Location: Right Arm)   Pulse (!) 112   Temp 97.9 F (36.6 C) (Oral)   Resp 20   SpO2 99%   Wt Readings from Last 3 Encounters:  03/11/22 287 lb (130.2 kg)  08/06/13 247 lb (112 kg)  07/29/13 247 lb 9.6 oz (112.3 kg)   Temp Readings from Last 3 Encounters:  01/23/23 97.9 F (36.6 C) (Oral)  10/02/22 98.2 F (36.8 C) (Oral)  03/11/22 98.2 F (36.8 C) (Oral)   BP Readings from Last 3 Encounters:  01/23/23 134/88  10/02/22 (!) 133/99  06/06/17 140/70   Pulse Readings from Last 3 Encounters:  01/23/23 (!) 112  10/02/22 (!) 105  03/11/22 78   Physical Exam Constitutional:      General: He is not in acute distress.    Appearance: Normal appearance. He is  well-developed. He is not ill-appearing, toxic-appearing or diaphoretic.  HENT:     Head: Normocephalic and atraumatic.     Right Ear: External ear normal.     Left Ear: External ear normal.     Nose: Nose normal.     Mouth/Throat:     Mouth: Mucous membranes are moist.  Eyes:  General: No scleral icterus.       Right eye: No discharge.        Left eye: No discharge.     Extraocular Movements: Extraocular movements intact.  Cardiovascular:     Rate and Rhythm: Normal rate and regular rhythm.     Heart sounds: Normal heart sounds. No murmur heard.    No friction rub. No gallop.  Pulmonary:     Effort: Pulmonary effort is normal. No respiratory distress.     Breath sounds: Normal breath sounds. No stridor. No wheezing, rhonchi or rales.  Abdominal:     General: There is distension.  Neurological:     Mental Status: He is alert and oriented to person, place, and time.  Psychiatric:        Mood and Affect: Mood normal.        Behavior: Behavior normal.        Thought Content: Thought content normal.     ED ECG REPORT   Date: 01/23/2023  EKG Time: 4:05 PM  Rate: 124bpm  Rhythm: atrial fibrillation with RVR  Axis: right  Intervals:none  ST&T Change: T-wave inversion in III, aVF, V6  Narrative Interpretation: Atrial fibrillation with RVR at 124 bpm.  Comparable to previous EKG from a year ago.   Assessment and Plan :   PDMP not reviewed this encounter.  1. Atrial fibrillation with RVR ([place])   2. Peripheral edema   3. Left hand pain   4. Shortness of breath   5. Tachycardia     I had an extensive discussion with patient about his EKG, overall assessment and is very high risk for life-threatening conditions. Patient is refusing transport to the hospital by EMS.  He also does not want to call a family member to help him get to the emergency room.  As such I have selected a disposition of AMA.  I emphasized that patient is able to change his mind and we will help by  calling EMS for safe transport to the hospital now.  He is also welcome to call 911 for EMS to transfer to the hospital.  Patient verbalized understanding of the life-threatening conditions he has and risk of bodily harm to himself and others should he attempt to transport himself.   Jaynee Eagles, [place] 01/23/23 1625

## 2023-01-23 NOTE — ED Triage Notes (Signed)
Pt c/o non radiating left sided chest pain started last night, but has been int last 4-5 days per pt. Pt c/o SOB. Pt states he has swelling hands, feet, abdomen and legs. Pt has 2+ edema of leg bilat. Pt has 2+ swelling of left hand.

## 2023-01-23 NOTE — Discharge Instructions (Signed)
You have refused transport to the hospital via EMS.  You have a potentially life-threatening condition and atrial fibrillation with a fast heart rate.  The consequences include death among other severe life-threatening injuries.  These types of conditions need to be managed in the emergency room as soon as possible.  Please present to the emergency room as safely and as soon as possible.  I do not recommend driving there.  You can always change her mind and call an ambulance or we can call an ambulance for you.

## 2023-01-23 NOTE — ED Provider Notes (Signed)
[facility] Provider Note   CSN: [id number] Arrival date & time: 01/23/23  1650     History  Chief Complaint  Patient presents with   Chest Pain    Parker [name] is a 57 y.o. male.  This is a 57 year old male with history of heart failure with preserved ejection fraction on torsemide, diabetes, hypertension and A-fib on amiodarone and Eliquis senting to the ED for chest pain with shortness of breath.  Patient states he has gotten progressively more short of breath over the last 4 days, he now has shortness of breath at rest.  He has also gained about 40 pounds in the past 6 months.  He has been on torsemide for quite some time, feels like he has intermittent adequate diuresis.  Has some decreased urine output with the torsemide and endorses orthopnea.  Denies any recent illnesses, no fevers, no nausea or vomiting.  Endorses increasing abdominal distention and bilateral lower extremity edema.     Home Medications Prior to Admission medications   Medication Sig Start Date End Date Taking? Authorizing Provider  acetaminophen (TYLENOL) 500 MG tablet Take by mouth.    [provider]  albuterol (VENTOLIN HFA) 108 (90 Base) MCG/ACT inhaler Inhale 2 puffs into the lungs every 6 (six) hours as needed. 09/18/22   [provider]  allopurinol (ZYLOPRIM) 100 MG tablet Take by mouth.    [provider]  amiodarone (PACERONE) 200 MG tablet Take 200 mg by mouth daily. 09/20/21   [provider]  amoxicillin (AMOXIL) 500 MG capsule Take 500 mg by mouth 3 (three) times daily. 05/22/22   [provider]  benzonatate (TESSALON) 100 MG capsule Take 1 capsule (100 mg total) by mouth 2 (two) times daily as needed for cough. 09/16/22   Perlie Mayo, NP  ELIQUIS 5 MG TABS tablet Take 5 mg by mouth 2 (two) times daily. 08/26/22   [provider]  fluticasone (FLONASE) 50 MCG/ACT nasal spray Place 2 sprays  into both nostrils daily. 09/16/22   Perlie Mayo, NP  folic acid (FOLVITE) 1 MG tablet Take 1 mg by mouth daily. 10/16/21   [provider]  folic acid (FOLVITE) 1 MG tablet Take 1 tablet by mouth daily. 07/07/22   [provider]  furosemide (LASIX) 40 MG tablet Take 2 tablets (80 mg total) by mouth daily. 10/02/22   Jaynee Eagles, PA-C  gabapentin (NEURONTIN) 300 MG capsule Take 300 mg by mouth 3 (three) times daily. 12/26/21   [provider]  levofloxacin (LEVAQUIN) 500 MG tablet Take 500 mg by mouth daily. 09/18/22   [provider]  levothyroxine (SYNTHROID) 50 MCG tablet Take 50 mcg by mouth daily. 09/11/22   [provider]  losartan (COZAAR) 50 MG tablet Take 50 mg by mouth daily. 08/28/22   [provider]  magnesium oxide (MAG-OX) 400 MG tablet Take 1 tablet by mouth 2 (two) times daily. 06/07/22   [provider]  MAGNESIUM-OXIDE 400 (240 Mg) MG tablet Take 400 mg by mouth 2 (two) times daily. 09/17/21   [provider]  meclizine (ANTIVERT) 25 MG tablet Take 25 mg by mouth 3 (three) times daily as needed. 07/07/22   [provider]  metoprolol succinate (TOPROL-XL) 50 MG 24 hr tablet Take 50 mg by mouth daily. 08/16/22   [provider]  Multiple Vitamins-Minerals (MULTIVITAMIN WITH MINERALS) tablet Take 1 tablet by mouth daily.    [provider]  nitroGLYCERIN (NITROSTAT) 0.4 MG SL tablet Place 1 tablet under the tongue every 5 (five) minutes as needed. 04/09/21   [provider]  NOREL AD 4-10-325 MG TABS Take 1 tablet by mouth 2 (two) times daily as needed. 09/16/22   [provider]  potassium chloride (KLOR-CON) 10 MEQ tablet Take 10 mEq by mouth daily. 05/16/22   [provider]  potassium chloride SA (KLOR-CON M) 20 MEQ tablet Take 1 tablet (20 mEq total) by mouth 2 (two) times daily. 10/02/22   Jaynee Eagles, PA-C  predniSONE (STERAPRED UNI-PAK 21 TAB) 10 MG (21) TBPK  tablet Take by mouth. 09/18/22   [provider]  torsemide (DEMADEX) 20 MG tablet Take 60 mg by mouth daily. 12/25/21   [provider]  traZODone (DESYREL) 50 MG tablet Take 25 mg by mouth at bedtime as needed. 08/28/22   [provider]      Allergies    Iodinated contrast media and Other    Review of Systems   Review of Systems  Constitutional:  Positive for fatigue. Negative for chills and fever.  Respiratory:  Positive for shortness of breath. Negative for cough.   Cardiovascular:  Positive for chest pain. Negative for palpitations.  Gastrointestinal:  Positive for abdominal distention. Negative for abdominal pain.  Neurological:  Negative for dizziness and syncope.    Physical Exam Updated Vital Signs BP (!) 149/104   Pulse (!) 128   Temp 98.2 F (36.8 C) (Oral)   Resp 17   Ht 5' 11"$  (1.803 m)   Wt 130.2 kg   SpO2 100%   BMI 40.03 kg/m  Physical Exam Vitals and nursing note reviewed.  Constitutional:      General: He is not in acute distress. HENT:     Head: Normocephalic.  Cardiovascular:     Rate and Rhythm: Normal rate and regular rhythm. No extrasystoles are present.    Heart sounds: Normal heart sounds. Heart sounds not distant.     No systolic murmur is present.     No diastolic murmur is present.  Pulmonary:     Effort: Pulmonary effort is normal. No tachypnea.     Breath sounds: Normal breath sounds. No decreased breath sounds, wheezing, rhonchi or rales.  Abdominal:     Tenderness: There is no abdominal tenderness.     Comments: Abdomen moderately distended, soft without rebound or guarding  Musculoskeletal:     Right lower leg: Edema (Significant lower extremity edema to the mid thigh) present.     Left lower leg: Edema present.  Skin:    General: Skin is warm.     Capillary Refill: Capillary refill takes less than 2 seconds.  Neurological:     General: No focal deficit present.     Mental Status: He is alert and oriented  to person, place, and time.     ED Results / Procedures / Treatments   Labs (all labs ordered are listed, but only abnormal results are displayed) Labs Reviewed  CBC - Abnormal; Notable for the following components:      Result Value   Hemoglobin 11.8 (*)    HCT 37.2 (*)    MCV 67.9 (*)    MCH 21.5 (*)    RDW 19.7 (*)    All other components within normal limits  BRAIN NATRIURETIC PEPTIDE - Abnormal; Notable for the following components:   B Natriuretic Peptide 377.4 (*)    All other components within normal limits  COMPREHENSIVE  METABOLIC PANEL - Abnormal; Notable for the following components:   CO2 21 (*)    Glucose, Bld 102 (*)    Creatinine, Ser 1.41 (*)    Calcium 8.8 (*)    GFR, Estimated 58 (*)    All other components within normal limits  TROPONIN I (HIGH SENSITIVITY) - Abnormal; Notable for the following components:   Troponin I (High Sensitivity) 35 (*)    All other components within normal limits  MAGNESIUM  TROPONIN I (HIGH SENSITIVITY)    EKG None  Radiology DG Chest 2 View  Result Date: 01/23/2023 CLINICAL DATA:  Left chest pain, cough, short of breath, nausea EXAM: CHEST - 2 VIEW COMPARISON:  10/02/2022 FINDINGS: Frontal and lateral views of the chest demonstrates stable enlargement of the cardiac silhouette. Chronic central vascular congestion without airspace disease, effusion, or pneumothorax. No acute bony abnormalities. IMPRESSION: 1. Stable enlarged cardiac silhouette and central vascular congestion. 2. No acute airspace disease. Electronically Signed   By: Randa Ngo M.D.   On: 01/23/2023 18:01    Procedures Procedures    Medications Ordered in ED Medications - No data to display  ED Course/ Medical Decision Making/ A&P                             Medical Decision Making Patient has known heart failure history with previous admissions, has also gained 40 pounds in the last 6 months and has progressive shortness of breath.  I have a high  degree suspicion for acute congestive heart failure.  Cannot rule out ACS as he is also having some chest pain, will obtain serial troponins and EKG.  He has no infectious symptoms, no fevers, chills, lungs are clear to auscultation bilaterally, no sputum production, low suspicion for bacterial pneumonia.  He also has some left hand swelling, this could represent gout versus cellulitis.  There is overlying warmth of the second and third MCP joint without significant swelling.  Will obtain hand x-ray for further evaluation.  I personally reviewed and interpreted patient's labs which were significant for an AKI with a creatinine of 1.41, BUN normal, anion gap borderline at 15 with a slightly low bicarb at 21, magnesium normal.  BNP elevated at 377, initial troponin 35, repeat troponin 31.  CBC shows no leukocytosis with a stable hemoglobin 11.8.  I personally reviewed and interpreted patient's chest x-ray and agree with radiology which shows a large cardiac silhouette, central pulmonary edema without focal consolidation.  I also reviewed patient's left hand x-ray which shows no signs of osteomyelitis or joint infection however there is dorsal soft tissue swelling.  I personally reviewed and interpreted patient's EKG which shows atrial fibrillation with RVR, rate 140, no acute ischemic changes.  Upon my evaluation of patient her rate is improved into the 110 range, will hold off on treatment for now as this is likely compensatory.  Given patient's decreased functional status and likely heart failure exacerbation, I gave patient 40 mg IV furosemide and called hospitalist team for admission.  I also discussed with hospitalist team his hand pain and concern for gout versus infection, they will evaluate further.  They agreed to admit patient to the hospital for further management.  Patient was stable upon admission to hospital.  Amount and/or Complexity of Data Reviewed External Data Reviewed: notes.     Details: Per chart review patient was seen by cardiology on 10/21/2021 for A-fib Labs: ordered. Decision-making details documented in  ED Course. Radiology: ordered and independent interpretation performed. Decision-making details documented in ED Course. ECG/medicine tests: ordered and independent interpretation performed. Decision-making details documented in ED Course.  Risk Prescription drug management. Decision regarding hospitalization.         Final Clinical Impression(s) / ED Diagnoses Final diagnoses:  None    Rx / DC Orders ED Discharge Orders     None         Jimmie Molly, MD 01/23/23 [id number]    Malvin Johns, MD 01/23/23 [id number]

## 2023-01-23 NOTE — ED Notes (Signed)
Pt refused EMS transport to ED-AMA explained by Bess Harvest, PA-pt signed AMA-left [facility]-NAD-steady gait

## 2023-01-24 ENCOUNTER — Other Ambulatory Visit (HOSPITAL_COMMUNITY): Payer: Self-pay

## 2023-01-24 ENCOUNTER — Inpatient Hospital Stay (HOSPITAL_COMMUNITY): Payer: PPO

## 2023-01-24 DIAGNOSIS — I5031 Acute diastolic (congestive) heart failure: Secondary | ICD-10-CM | POA: Diagnosis not present

## 2023-01-24 DIAGNOSIS — I5023 Acute on chronic systolic (congestive) heart failure: Secondary | ICD-10-CM | POA: Diagnosis not present

## 2023-01-24 DIAGNOSIS — I480 Paroxysmal atrial fibrillation: Secondary | ICD-10-CM | POA: Diagnosis not present

## 2023-01-24 DIAGNOSIS — I50813 Acute on chronic right heart failure: Secondary | ICD-10-CM | POA: Diagnosis not present

## 2023-01-24 LAB — ECHOCARDIOGRAM COMPLETE
Height: 71 in
S' Lateral: 3.4 cm
Weight: 4761.94 oz

## 2023-01-24 LAB — BASIC METABOLIC PANEL
Anion gap: 12 (ref 5–15)
BUN: 21 mg/dL — ABNORMAL HIGH (ref 6–20)
CO2: 26 mmol/L (ref 22–32)
Calcium: 8.8 mg/dL — ABNORMAL LOW (ref 8.9–10.3)
Chloride: 99 mmol/L (ref 98–111)
Creatinine, Ser: 1.32 mg/dL — ABNORMAL HIGH (ref 0.61–1.24)
GFR, Estimated: >60 mL/min (ref >60)
Glucose, Bld: 110 mg/dL — ABNORMAL HIGH (ref 70–99)
Potassium: 3.4 mmol/L — ABNORMAL LOW (ref 3.5–5.1)
Sodium: 137 mmol/L (ref 135–145)

## 2023-01-24 LAB — CBC WITH DIFFERENTIAL/PLATELET
Abs Immature Granulocytes: 0 10*3/uL (ref 0.00–0.07)
Basophils Absolute: 0.2 10*3/uL — ABNORMAL HIGH (ref 0.0–0.1)
Basophils Relative: 4 %
Eosinophils Absolute: 0.2 10*3/uL (ref 0.0–0.5)
Eosinophils Relative: 4 %
HCT: 38.1 % — ABNORMAL LOW (ref 39.0–52.0)
Hemoglobin: 12.2 g/dL — ABNORMAL LOW (ref 13.0–17.0)
Lymphocytes Relative: 30 %
Lymphs Abs: 1.8 10*3/uL (ref 0.7–4.0)
MCH: 21.7 pg — ABNORMAL LOW (ref 26.0–34.0)
MCHC: 32 g/dL (ref 30.0–36.0)
MCV: 67.7 fL — ABNORMAL LOW (ref 80.0–100.0)
Monocytes Absolute: 0.2 10*3/uL (ref 0.1–1.0)
Monocytes Relative: 4 %
Neutro Abs: 3.5 10*3/uL (ref 1.7–7.7)
Neutrophils Relative %: 58 %
Platelets: 215 10*3/uL (ref 150–400)
RBC: 5.63 MIL/uL (ref 4.22–5.81)
RDW: 19.8 % — ABNORMAL HIGH (ref 11.5–15.5)
WBC: 6 10*3/uL (ref 4.0–10.5)
nRBC: 0 % (ref 0.0–0.2)
nRBC: 0 /100 WBC

## 2023-01-24 LAB — HIV ANTIBODY (ROUTINE TESTING W REFLEX): HIV Screen 4th Generation wRfx: NONREACTIVE

## 2023-01-24 LAB — TSH: TSH: 3.562 u[IU]/mL (ref 0.350–4.500)

## 2023-01-24 LAB — MAGNESIUM: Magnesium: 1.7 mg/dL (ref 1.7–2.4)

## 2023-01-24 MED ORDER — MAGNESIUM OXIDE -MG SUPPLEMENT 400 (240 MG) MG PO TABS
400.0000 mg | ORAL_TABLET | Freq: Every day | ORAL | Status: DC
  Administered 2023-01-25: 400 mg via ORAL
  Filled 2023-01-24 (×3): qty 1

## 2023-01-24 MED ORDER — METHYLPREDNISOLONE SODIUM SUCC 40 MG IJ SOLR
40.0000 mg | Freq: Once | INTRAMUSCULAR | Status: AC
  Administered 2023-01-24: 40 mg via INTRAVENOUS
  Filled 2023-01-24: qty 1

## 2023-01-24 MED ORDER — DIPHENHYDRAMINE HCL 50 MG/ML IJ SOLN
50.0000 mg | Freq: Once | INTRAMUSCULAR | Status: AC
  Administered 2023-01-24: 50 mg via INTRAVENOUS
  Filled 2023-01-24: qty 1

## 2023-01-24 MED ORDER — ALBUTEROL SULFATE (2.5 MG/3ML) 0.083% IN NEBU: 2.5000 mg | INHALATION_SOLUTION | Freq: Four times a day (QID) | RESPIRATORY_TRACT | Status: DC | PRN

## 2023-01-24 MED ORDER — IOHEXOL 350 MG/ML SOLN
75.0000 mL | Freq: Once | INTRAVENOUS | Status: AC | PRN
  Administered 2023-01-24: 75 mL via INTRAVENOUS

## 2023-01-24 MED ORDER — DIPHENHYDRAMINE HCL 25 MG PO CAPS
50.0000 mg | ORAL_CAPSULE | Freq: Once | ORAL | Status: AC
  Administered 2023-01-24: 50 mg via ORAL
  Filled 2023-01-24: qty 2

## 2023-01-24 MED ORDER — POTASSIUM CHLORIDE CRYS ER 20 MEQ PO TBCR
40.0000 meq | EXTENDED_RELEASE_TABLET | Freq: Two times a day (BID) | ORAL | Status: AC
  Administered 2023-01-24 (×2): 40 meq via ORAL
  Filled 2023-01-24 (×2): qty 2

## 2023-01-24 MED ORDER — FLUTICASONE PROPIONATE 50 MCG/ACT NA SUSP
2.0000 | Freq: Every day | NASAL | Status: DC
  Administered 2023-01-25: 2 via NASAL
  Filled 2023-01-24: qty 16

## 2023-01-24 MED ORDER — MAGNESIUM OXIDE -MG SUPPLEMENT 400 (240 MG) MG PO TABS
400.0000 mg | ORAL_TABLET | Freq: Once | ORAL | Status: AC
  Administered 2023-01-24: 400 mg via ORAL
  Filled 2023-01-24: qty 1

## 2023-01-24 MED ORDER — IOHEXOL 350 MG/ML SOLN: 75.0000 mL | Freq: Once | INTRAVENOUS | Status: DC | PRN

## 2023-01-24 NOTE — Progress Notes (Addendum)
Heart Failure Nurse Navigator Progress Note  Assessed for HV TOC readiness. Pt resting on ED stretcher. Performed brief interview. Completed SDOH assessment, no immediate needs noted. Pt states he occasionally forgets to take his medications. Offered pill Chief Executive Officer, declined at this time. Encouraged pt to take medications as prescribed.  Agreeable to [facility] clinic appointment. Friday March 15 @ 10AM. Gave HF education booklet.  Pt states he lives at home with spouse, spouse called pt, request time to speak with her. Ended interview at this time.    Pricilla Holm, MSN, RN Heart Failure Nurse Navigator

## 2023-01-24 NOTE — Progress Notes (Signed)
Progress Note   Patient: Parker [name] [id number] DOB: [birth year] DOA: 01/23/2023     1 DOS: the patient was seen and examined on 01/24/2023   Brief hospital course: Patient with h/o gout, chronic diastolic CHF, DM, afib, hypothyroidism, and morbid obesity presenting with anasarca, orthopnea.  He was diagnosed with acute on chronic CHF and started on Lasix with cardiology consult and echo ordered.  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 -As such CTA was ordered -PE ruled out but echo showed marked R-sided heart failure -Will defer to cardiology, who is consulting -Continue duiresis  Gout -Reports acute flare currently -Continue SoluMedrol for now -prn pain meds -Unremarkable hand and foot xray  Hypothyroidism -Continue Synthroid -Normal TSH  HTN (hypertension) -Hold losartan in the setting of AKI -Continue Toprol XL  Atrial fibrillation with RVR (HCC) -Rate controlled at home with amiodarone and Toprol XL - continued -Can add IV medications if needed but this has improved (periodically) during hospitalization -He reports that he is only symptomatic when his HR is >160 -Continue Eliquis, although he prefers to change to Xarelto if possible because he is not always compliant with the afternoon dose at home  AKI (acute kidney injury) ([place]) -Patient without baseline compromised renal function  -Current creatinine is >0.3 compared to baseline  -Likely due to diminished renal perfusion in the setting of CHF, afib with RVR -Follow up renal function by BMP -Avoid ACEI and NSAIDs         Subjective: Patient was sitting up at the bedside tonight on RA and looked very comfortable, asking to go home.  We discussed the ongoing concerns and he was ok with staying in the hospital for now.  No acute concerns, breathing much better.   Physical Exam: Vitals:   01/24/23 1100 01/24/23 1300 01/24/23 1500 01/24/23 1524  BP:  (!) 130/96 126/75 (!) 114/91  Pulse:  (!) 101 (!) 124 (!) 109  Resp:  (!) '26 20 20  '$ Temp: 98.1 F (36.7 C)  98.5 F (36.9 C) 97.6 F (36.4 C)  TempSrc: Oral  Oral Oral  SpO2:  92% 100% 97%  Weight:    134.4 kg  Height:    '5\' 11"'$  (1.803 m)   General:  Appears calm and comfortable and is in NAD Eyes:   EOMI, normal lids, iris ENT:  grossly normal hearing, lips & tongue, mmm Neck:  no LAD, masses or thyromegaly Cardiovascular:  RRR, no m/r/g. Marked chronic LE edema/elephantiasis.  Respiratory:   CTA bilaterally with no wheezes/rales/rhonchi.  Normal respiratory effort. Abdomen:  soft, NT, ND Skin:  no rash or induration seen on limited exam Musculoskeletal:  grossly normal tone BUE/BLE, good ROM, no bony abnormality Psychiatric:  grossly normal mood and affect, speech fluent and appropriate, AOx3 Neurologic:  CN 2-12 grossly intact, moves all extremities in coordinated fashion   Radiological Exams on Admission: Independently reviewed - see discussion in A/P where applicable  CT Angio Chest Pulmonary Embolism (PE) W or WO Contrast  Result Date: 01/24/2023 CLINICAL DATA:  Pulmonary embolus suspected with high probability. Pain and swelling to the right lower extremity and left hand. Abdominal pain and swelling  for 5 days. EXAM: CT ANGIOGRAPHY CHEST WITH CONTRAST TECHNIQUE: Multidetector CT imaging of the chest was performed using the standard protocol during bolus administration of intravenous contrast. Multiplanar CT image reconstructions and MIPs were obtained to evaluate the vascular anatomy. RADIATION DOSE REDUCTION: This exam was performed  according to the departmental dose-optimization program which includes automated exposure control, adjustment of the mA and/or kV according to patient size and/or use of iterative reconstruction technique. CONTRAST:  58m OMNIPAQUE IOHEXOL 350 MG/ML SOLN COMPARISON:  Chest radiograph 01/23/2023 FINDINGS: Cardiovascular: There is moderately good opacification of the central and proximal segmental pulmonary arteries. Distal segmental and subsegmental vessels are poorly visualized due to somewhat weak contrast bolus. Visualized pulmonary arteries are patent without evidence of significant central pulmonary embolus. Diffuse cardiac enlargement with reflux of contrast material into the hepatic veins suggesting right heart failure. No pericardial effusion. Normal caliber thoracic aorta. Mediastinum/Nodes: Esophagus is decompressed. No significant lymphadenopathy. Thyroid gland is unremarkable. Lungs/Pleura: Motion artifact. Probable linear atelectasis in the lung bases. No edema or consolidation suggested. No pleural effusions. No pneumothorax. Upper Abdomen: Diffuse liver enlargement. Small amount of ascites around the liver edge. Musculoskeletal: Degenerative changes in the spine. Review of the MIP images confirms the above findings. IMPRESSION: 1. No evidence of significant central pulmonary embolus. 2. Cardiac enlargement with evidence of right heart failure. 3. Lungs are clear. 4. Liver is diffusely enlarged.  Small amount of abdominal ascites. Electronically Signed   By: WLucienne CapersM.D.   On: 01/24/2023 16:51   ECHOCARDIOGRAM COMPLETE  Result Date: 01/24/2023    ECHOCARDIOGRAM REPORT   Patient Name:   Parker MCNABBDate of Exam: 01/24/2023 Medical Rec #:  [id number]     Height:       71.0 in Accession #:    [id number]    Weight:       297.6 lb Date of Birth:  [birth year]    BSA:          2.497 m Patient Age:    89years       BP:           121/80 mmHg Patient Gender: M              HR:           109 bpm.  Exam Location:  Inpatient Procedure: 2D Echo Indications:    acute diastolic chf  History:        Patient has no prior history of Echocardiogram examinations.                 Arrythmias:Atrial Fibrillation.  Sonographer:    LJohny ChessRDCS Referring Phys: [id number] 1. Right ventricular systolic function is moderately reduced. The right ventricular size is severely enlarged. There is moderately elevated pulmonary artery systolic pressure. The estimated right ventricular systolic pressure is [id number]. Unknown chronicity of RV failure, but findings concerning for possible PE and recommend further work-up if clinically indicated.  2. Left ventricular ejection fraction, by estimation, is 55 to 60%. The left ventricle has normal function. The left ventricle has no regional wall motion abnormalities. There is mild concentric left ventricular hypertrophy. Left ventricular diastolic parameters are indeterminate. There is the interventricular septum is flattened in systole, consistent with right ventricular pressure overload.  3. Left atrial size was moderately dilated.  4. Right atrial size was moderately dilated.  5. The mitral valve is grossly normal. Mild mitral valve regurgitation.  6. Tricuspid  valve regurgitation is mild to moderate.  7. The aortic valve is tricuspid. There is mild calcification of the aortic valve. There is mild thickening of the aortic valve. Aortic valve regurgitation is not visualized. Aortic valve sclerosis/calcification is present, without any evidence of aortic stenosis.  8. The inferior vena cava is dilated in size with <50% respiratory variability, suggesting right atrial pressure of 15 mmHg. Comparison(s): No prior Echocardiogram. FINDINGS  Left Ventricle: Left ventricular ejection fraction, by estimation, is 55 to 60%. The left ventricle has normal function. The left ventricle has no regional wall motion abnormalities. The left ventricular internal cavity size  was normal in size. There is  mild concentric left ventricular hypertrophy. The interventricular septum is flattened in systole, consistent with right ventricular pressure overload. Left ventricular diastolic parameters are indeterminate. Right Ventricle: The right ventricular size is severely enlarged. No increase in right ventricular wall thickness. Right ventricular systolic function is moderately reduced. There is moderately elevated pulmonary artery systolic pressure. The tricuspid regurgitant velocity is 3.12 m/s, and with an assumed right atrial pressure of 15 mmHg, the estimated right ventricular systolic pressure is [id number] mmHg. Left Atrium: Left atrial size was moderately dilated. Right Atrium: Right atrial size was moderately dilated. Pericardium: There is no evidence of pericardial effusion. Mitral Valve: The mitral valve is grossly normal. There is mild thickening of the mitral valve leaflet(s). There is mild calcification of the mitral valve leaflet(s). Mild mitral valve regurgitation. Tricuspid Valve: The tricuspid valve is normal in structure. Tricuspid valve regurgitation is mild to moderate. Aortic Valve: The aortic valve is tricuspid. There is mild calcification of the aortic valve. There is mild thickening of the aortic valve. Aortic valve regurgitation is not visualized. Aortic valve sclerosis/calcification is present, without any evidence of aortic stenosis. Pulmonic Valve: The pulmonic valve was normal in structure. Pulmonic valve regurgitation is trivial. Aorta: The aortic root and ascending aorta are structurally normal, with no evidence of dilitation. Venous: The inferior vena cava is dilated in size with less than 50% respiratory variability, suggesting right atrial pressure of 15 mmHg. IAS/Shunts: The atrial septum is grossly normal.  LEFT VENTRICLE PLAX 2D LVIDd:         4.40 cm LVIDs:         3.40 cm LV PW:         1.30 cm LV IVS:        1.30 cm LVOT diam:     2.00 cm LVOT Area:     3.14  cm  RIGHT VENTRICLE          IVC RV Basal diam:  3.70 cm  IVC diam: 3.10 cm LEFT ATRIUM            Index        RIGHT ATRIUM           Index LA diam:      4.30 cm  1.72 cm/m   RA Area:     26.10 cm LA Vol (A2C): 61.5 ml  24.63 ml/m  RA Volume:   80.80 ml  32.36 ml/m LA Vol (A4C): 111.0 ml 44.46 ml/m   AORTA Ao Root diam: 2.90 cm Ao Asc diam:  3.40 cm TRICUSPID VALVE TR Peak grad:   38.9 mmHg TR Vmax:        312.00 cm/s  SHUNTS Systemic Diam: 2.00 cm Gwyndolyn Kaufman MD Electronically signed by Gwyndolyn Kaufman MD Signature Date/Time: 01/24/2023/3:10:14 PM    Final    DG Foot 2 Views Right  Result Date: 01/24/2023 CLINICAL DATA:  57 year old male with right great toe pain. EXAM: RIGHT FOOT - 2 VIEW COMPARISON:  Right foot series 01/08/2023. FINDINGS: Generalized soft tissue swelling not significantly changed from earlier this month. Underlying bone mineralization is within normal limits. Calcaneus appears intact with degenerative spurring. Joint spaces and alignment are maintained. No acute fracture, dislocation, or osteolysis identified. No soft tissue gas or radiopaque foreign body identified. IMPRESSION: Generalized soft tissue swelling not significantly changed from earlier this month. No acute osseous abnormality identified about the right foot. Electronically Signed   By: Genevie Ann M.D.   On: 01/24/2023 05:24   DG Hand 2 View Left  Result Date: 01/23/2023 CLINICAL DATA:  Second and third digit swelling EXAM: LEFT HAND - 2 VIEW COMPARISON:  None Available. FINDINGS: Frontal and lateral views of the left hand are obtained. No fracture, subluxation, or dislocation. Mild joint space narrowing within the metacarpophalangeal and interphalangeal joints. There is dorsal soft tissue swelling throughout the hand. IMPRESSION: 1. Mild osteoarthritis. 2. Dorsal soft tissue swelling. 3. No acute fracture. Electronically Signed   By: Randa Ngo M.D.   On: 01/23/2023 21:25   DG Chest 2 View  Result Date:  01/23/2023 CLINICAL DATA:  Left chest pain, cough, short of breath, nausea EXAM: CHEST - 2 VIEW COMPARISON:  10/02/2022 FINDINGS: Frontal and lateral views of the chest demonstrates stable enlargement of the cardiac silhouette. Chronic central vascular congestion without airspace disease, effusion, or pneumothorax. No acute bony abnormalities. IMPRESSION: 1. Stable enlarged cardiac silhouette and central vascular congestion. 2. No acute airspace disease. Electronically Signed   By: Randa Ngo M.D.   On: 01/23/2023 18:01    EKG: Independently reviewed.   1558 - Afib with rate 124; nonspecific ST changes with that may be rate related 1640 - Afib with rate 140; nonspecific ST changes with that may be rate related   Labs on Admission: I have personally reviewed the available labs and imaging studies at the time of the admission.  Pertinent labs:    Glucose 110 BUN 21/Creatinine 1.32/GFR >60; baseline 0.96/>60  WBC 6 Hgb 12.2 - stable D-dimer 10.31 TSH 3.562 BNP 377.4 HS troponin 35, 31   Family Communication: None present; he is capable of communicating with family at this time  Disposition: Status is: Inpatient Remains inpatient appropriate because: on IV Lasix, requires ongoing inpatient cardiology care  Planned Discharge Destination: Home   Time spent: 50 minutes  Author: Karmen Bongo, MD 01/24/2023 5:32 PM  For on call review [email or web address].

## 2023-01-24 NOTE — Assessment & Plan Note (Signed)
-  Patient without baseline compromised renal function  -Current creatinine is >0.3 compared to baseline  -Likely due to diminished renal perfusion in the setting of CHF, afib with RVR -Follow up renal function by BMP -Avoid ACEI and NSAIDs

## 2023-01-24 NOTE — ED Notes (Signed)
ED TO INPATIENT HANDOFF REPORT  ED Nurse Name and Phone #: [phone]  S Name/Age/Gender Parker [name] 57 y.o. male Room/Bed: 040C/040C  Code Status   Code Status: Full Code  Home/SNF/Other Home Patient oriented to: self, place, time, and situation Is this baseline? Yes   Triage Complete: Triage complete  Chief Complaint Acute exacerbation of CHF (congestive heart failure) ([place]) [I50.9]  Triage Note Pt c/o non radiating left sided chest pain started last night, but has been int last 4-5 days per pt. Pt c/o SOB. Pt states he has swelling hands, feet, abdomen and legs. Pt has 2+ edema of leg bilat. Pt has 2+ swelling of left hand.   Allergies Allergies  Allergen Reactions   Iodinated Contrast Media Itching   Other Rash    Other reaction(s): Other (See Comments) Neoprene: Rash    Level of Care/Admitting Diagnosis ED Disposition     ED Disposition  Admit   Condition  --   [place]: [facility] [[id number]]  Level of Care: Telemetry Cardiac [[id number]]  May admit patient to [facility] or [facility] if equivalent level of care is available:: Yes  Covid Evaluation: Confirmed COVID Negative  Diagnosis: Acute exacerbation of CHF (congestive heart failure) ([place]) [id number]  Admitting Physician: [place], [place]  Attending Physician: Quintella Baton [id number]  Certification:: I certify this patient will need inpatient services for at least 2 midnights  Estimated Length of Stay: 2          B Medical/Surgery History Past Medical History:  Diagnosis Date   (HFpEF) heart failure with preserved ejection fraction (HCC)    Arthritis    knees. elbow, ankle   Diabetes mellitus without complication (HCC)    Dyspnea    when in A-fib   Dysrhythmia    A-fib   GERD (gastroesophageal reflux disease)    takes Dexilant prn   Hypertension    takes Lisinopril-HCTZ daily   Muscle spasm    takes Relafen daily   Peripheral edema    Plantar fasciitis     right   Past Surgical History:  Procedure Laterality Date   EAR CYST EXCISION N/A 08/06/2013   Procedure: CYST REMOVAL;  Surgeon: Ruby Cola, MD;  Location: [facility];  Service: ENT;  Laterality: N/A;   ESOPHAGOGASTRODUODENOSCOPY     left knee arthroscopy  2003   PAROTIDECTOMY Right 08/06/2013   Procedure: PAROTIDECTOMY;  Surgeon: Ruby Cola, MD;  Location: [facility];  Service: ENT;  Laterality: Right;   TONSILLECTOMY Bilateral 08/06/2013   Procedure: TONSILLECTOMY;  Surgeon: Ruby Cola, MD;  Location: [facility];  Service: ENT;  Laterality: Bilateral;     A IV Location/Drains/Wounds Patient Lines/Drains/Airways Status     Active Line/Drains/Airways     Name Placement date Placement time Site Days   Peripheral IV 01/23/23 20 G Anterior;Distal;Right;Upper Arm 01/23/23  2052  Arm  1            Intake/Output Last 24 hours  Intake/Output Summary (Last 24 hours) at 01/24/2023 1357 Last data filed at 01/24/2023 1054 Gross per 24 hour  Intake --  Output 3500 ml  Net -3500 ml    Labs/Imaging Results for orders placed or performed during the hospital encounter of 01/23/23 (from the past 48 hour(s))  CBC     Status: Abnormal   Collection Time: 01/23/23  5:10 PM  Result Value Ref Range   WBC 6.5 4.0 - 10.5 K/uL   RBC 5.48 4.22 - 5.81 MIL/uL  Hemoglobin 11.8 (L) 13.0 - 17.0 g/dL   HCT 37.2 (L) 39.0 - 52.0 %   MCV 67.9 (L) 80.0 - 100.0 fL   MCH 21.5 (L) 26.0 - 34.0 pg   MCHC 31.7 30.0 - 36.0 g/dL   RDW 19.7 (H) 11.5 - 15.5 %   Platelets 228 150 - 400 K/uL    Comment: REPEATED TO VERIFY   nRBC 0.0 0.0 - 0.2 %    Comment: Performed at [facility] [address], [place], [place] [address]  Troponin I (High Sensitivity)     Status: Abnormal   Collection Time: 01/23/23  5:10 PM  Result Value Ref Range   Troponin I (High Sensitivity) 35 (H) <18 ng/L    Comment: (NOTE) Elevated high sensitivity troponin I (hsTnI) values and significant  changes across serial measurements  may suggest ACS but many other  chronic and acute conditions are known to elevate hsTnI results.  Refer to the "Links" section for chest pain algorithms and additional  guidance. Performed at [facility], [place] [address], [place], [place] [address]   Magnesium     Status: None   Collection Time: 01/23/23  5:10 PM  Result Value Ref Range   Magnesium 1.7 1.7 - 2.4 mg/dL    Comment: Performed at [facility] [address], [place], [place] [address]  Brain natriuretic peptide     Status: Abnormal   Collection Time: 01/23/23  5:10 PM  Result Value Ref Range   B Natriuretic Peptide 377.4 (H) 0.0 - 100.0 pg/mL    Comment: Performed at [facility] [address] [place], [place] [address]  Comprehensive metabolic panel     Status: Abnormal   Collection Time: 01/23/23  5:10 PM  Result Value Ref Range   Sodium 137 135 - 145 mmol/L   Potassium 3.6 3.5 - 5.1 mmol/L   Chloride 101 98 - 111 mmol/L   CO2 21 (L) 22 - 32 mmol/L   Glucose, Bld 102 (H) 70 - 99 mg/dL    Comment: Glucose reference range applies only to samples taken after fasting for at least 8 hours.   BUN 20 6 - 20 mg/dL   Creatinine, Ser 1.41 (H) 0.61 - 1.24 mg/dL   Calcium 8.8 (L) 8.9 - 10.3 mg/dL   Total Protein 7.4 6.5 - 8.1 g/dL   Albumin 3.6 3.5 - 5.0 g/dL   AST 24 15 - 41 U/L   ALT 12 0 - 44 U/L   Alkaline Phosphatase 60 38 - 126 U/L   Total Bilirubin 1.2 0.3 - 1.2 mg/dL   GFR, Estimated 58 (L) >60 mL/min    Comment: (NOTE) Calculated using the CKD-EPI Creatinine Equation (2021)    Anion gap 15 5 - 15    Comment: Performed at [facility] [address], [place], [place] [address]  Troponin I (High Sensitivity)     Status: Abnormal   Collection Time: 01/23/23  8:37 PM  Result Value Ref Range   Troponin I (High Sensitivity) 31 (H) <18 ng/L    Comment: (NOTE) Elevated high sensitivity troponin I (hsTnI) values and significant  changes across serial measurements may suggest ACS but many  other  chronic and acute conditions are known to elevate hsTnI results.  Refer to the "Links" section for chest pain algorithms and additional  guidance. Performed at [facility], [place] [address] [place], [place] [address]   D-dimer, quantitative  Status: Abnormal   Collection Time: 01/23/23  8:45 PM  Result Value Ref Range   D-Dimer, Quant 10.31 (H) 0.00 - 0.50 ug/mL-FEU    Comment: (NOTE) At the manufacturer cut-off value of 0.5 g/mL FEU, this assay has a negative predictive value of 95-100%.This assay is intended for use in conjunction with a clinical pretest probability (PTP) assessment model to exclude pulmonary embolism (PE) and deep venous thrombosis (DVT) in outpatients suspected of PE or DVT. Results should be correlated with clinical presentation. Performed at [facility], [place] [address], [place], [place] [address]   CBC     Status: Abnormal   Collection Time: 01/23/23  8:45 PM  Result Value Ref Range   WBC 7.0 4.0 - 10.5 K/uL   RBC 5.64 4.22 - 5.81 MIL/uL   Hemoglobin 12.4 (L) 13.0 - 17.0 g/dL   HCT 38.0 (L) 39.0 - 52.0 %   MCV 67.4 (L) 80.0 - 100.0 fL   MCH 22.0 (L) 26.0 - 34.0 pg   MCHC 32.6 30.0 - 36.0 g/dL   RDW 20.1 (H) 11.5 - 15.5 %   Platelets 231 150 - 400 K/uL    Comment: REPEATED TO VERIFY   nRBC 0.0 0.0 - 0.2 %    Comment: Performed at [facility], [place] [address], [place], [place] [address]  Creatinine, serum     Status: Abnormal   Collection Time: 01/23/23  8:45 PM  Result Value Ref Range   Creatinine, Ser 1.39 (H) 0.61 - 1.24 mg/dL   GFR, Estimated 59 (L) >60 mL/min    Comment: (NOTE) Calculated using the CKD-EPI Creatinine Equation (2021) Performed at [facility] [address] [place], [place] [id number]   Basic metabolic panel     Status: Abnormal   Collection Time: 01/24/23  2:06 AM  Result Value Ref Range   Sodium 137 135 - 145 mmol/L   Potassium 3.4 (L) 3.5 - 5.1 mmol/L   Chloride 99 98 - 111 mmol/L    CO2 26 22 - 32 mmol/L   Glucose, Bld 110 (H) 70 - 99 mg/dL    Comment: Glucose reference range applies only to samples taken after fasting for at least 8 hours.   BUN 21 (H) 6 - 20 mg/dL   Creatinine, Ser 1.32 (H) 0.61 - 1.24 mg/dL   Calcium 8.8 (L) 8.9 - 10.3 mg/dL   GFR, Estimated >60 >60 mL/min    Comment: (NOTE) Calculated using the CKD-EPI Creatinine Equation (2021)    Anion gap 12 5 - 15    Comment: Performed at [facility] [address] [place], [place] [address]  Magnesium     Status: None   Collection Time: 01/24/23  2:06 AM  Result Value Ref Range   Magnesium 1.7 1.7 - 2.4 mg/dL    Comment: Performed at [facility], [place] [address] [place], [place] [address]  CBC with Differential/Platelet     Status: Abnormal   Collection Time: 01/24/23  2:06 AM  Result Value Ref Range   WBC 6.0 4.0 - 10.5 K/uL   RBC 5.63 4.22 - 5.81 MIL/uL   Hemoglobin 12.2 (L) 13.0 - 17.0 g/dL   HCT 38.1 (L) 39.0 - 52.0 %   MCV 67.7 (L) 80.0 - 100.0 fL   MCH 21.7 (L) 26.0 - 34.0 pg   MCHC 32.0 30.0 - 36.0 g/dL   RDW 19.8 (H) 11.5 - 15.5 %   Platelets 215 150 -  400 K/uL    Comment: REPEATED TO VERIFY   nRBC 0.0 0.0 - 0.2 %   Neutrophils Relative % 58 %   Neutro Abs 3.5 1.7 - 7.7 K/uL   Lymphocytes Relative 30 %   Lymphs Abs 1.8 0.7 - 4.0 K/uL   Monocytes Relative 4 %   Monocytes Absolute 0.2 0.1 - 1.0 K/uL   Eosinophils Relative 4 %   Eosinophils Absolute 0.2 0.0 - 0.5 K/uL   Basophils Relative 4 %   Basophils Absolute 0.2 (H) 0.0 - 0.1 K/uL   nRBC 0 0 /100 WBC   Abs Immature Granulocytes 0.00 0.00 - 0.07 K/uL   Polychromasia PRESENT     Comment: Performed at [facility] [address], [place], [place] [address]  HIV Antibody (routine testing w rflx)     Status: None   Collection Time: 01/24/23  2:06 AM  Result Value Ref Range   HIV Screen 4th Generation wRfx Non Reactive Non Reactive    Comment: Performed at [facility], [place] [address] [place],  [place] [address]  TSH     Status: None   Collection Time: 01/24/23  4:47 AM  Result Value Ref Range   TSH 3.562 0.350 - 4.500 uIU/mL    Comment: Performed by a 3rd Generation assay with a functional sensitivity of <=0.01 uIU/mL. Performed at [facility], [place] [address], [place], [place] [address]    DG Foot 2 Views Right  Result Date: 01/24/2023 CLINICAL DATA:  57 year old male with right great toe pain. EXAM: RIGHT FOOT - 2 VIEW COMPARISON:  Right foot series 01/08/2023. FINDINGS: Generalized soft tissue swelling not significantly changed from earlier this month. Underlying bone mineralization is within normal limits. Calcaneus appears intact with degenerative spurring. Joint spaces and alignment are maintained. No acute fracture, dislocation, or osteolysis identified. No soft tissue gas or radiopaque foreign body identified. IMPRESSION: Generalized soft tissue swelling not significantly changed from earlier this month. No acute osseous abnormality identified about the right foot. Electronically Signed   By: Genevie Ann M.D.   On: 01/24/2023 05:24   DG Hand 2 View Left  Result Date: 01/23/2023 CLINICAL DATA:  Second and third digit swelling EXAM: LEFT HAND - 2 VIEW COMPARISON:  None Available. FINDINGS: Frontal and lateral views of the left hand are obtained. No fracture, subluxation, or dislocation. Mild joint space narrowing within the metacarpophalangeal and interphalangeal joints. There is dorsal soft tissue swelling throughout the hand. IMPRESSION: 1. Mild osteoarthritis. 2. Dorsal soft tissue swelling. 3. No acute fracture. Electronically Signed   By: Randa Ngo M.D.   On: 01/23/2023 21:25   DG Chest 2 View  Result Date: 01/23/2023 CLINICAL DATA:  Left chest pain, cough, short of breath, nausea EXAM: CHEST - 2 VIEW COMPARISON:  10/02/2022 FINDINGS: Frontal and lateral views of the chest demonstrates stable enlargement of the cardiac silhouette. Chronic central vascular congestion without  airspace disease, effusion, or pneumothorax. No acute bony abnormalities. IMPRESSION: 1. Stable enlarged cardiac silhouette and central vascular congestion. 2. No acute airspace disease. Electronically Signed   By: Randa Ngo M.D.   On: 01/23/2023 18:01    Pending Labs Unresulted Labs (From admission, onward)    None       Vitals/Pain Today's Vitals   01/24/23 0730 01/24/23 1030 01/24/23 1100 01/24/23 1300  BP: (!) 108/90 121/80  (!) 130/96  Pulse: 70 (!) 122  (!) 101  Resp: (!) 21 17  (!) 26  Temp:   98.1 F (36.7 C)   TempSrc:   Oral   SpO2: 98% 99%  92%  Weight:      Height:      PainSc:        Isolation Precautions No active isolations  Medications Medications  sodium chloride flush (NS) 0.9 % injection 3 mL (3 mLs Intravenous Given 01/24/23 0919)  sodium chloride flush (NS) 0.9 % injection 3 mL (has no administration in time range)  0.9 %  sodium chloride infusion (has no administration in time range)  acetaminophen (TYLENOL) tablet 650 mg (has no administration in time range)  ondansetron (ZOFRAN) injection 4 mg (has no administration in time range)  furosemide (LASIX) injection 40 mg (40 mg Intravenous Given 01/24/23 0906)  amiodarone (PACERONE) tablet 200 mg (200 mg Oral Given 01/24/23 0905)  apixaban (ELIQUIS) tablet 5 mg (5 mg Oral Given [id number])  folic acid (FOLVITE) tablet 1 mg (1 mg Oral Given 01/24/23 0904)  levothyroxine (SYNTHROID) tablet 50 mcg (50 mcg Oral Given 01/24/23 0905)  metoprolol succinate (TOPROL-XL) 24 hr tablet 50 mg (50 mg Oral Given 01/24/23 0905)  nitroGLYCERIN (NITROSTAT) SL tablet 0.4 mg (has no administration in time range)  traZODone (DESYREL) tablet 25 mg (has no administration in time range)  morphine (PF) 2 MG/ML injection 1 mg (has no administration in time range)  oxyCODONE (Oxy IR/ROXICODONE) immediate release tablet 5 mg (has no administration in time range)  methylPREDNISolone sodium succinate (SOLU-MEDROL) 40 mg/mL  injection 20 mg (20 mg Intravenous Given 01/24/23 1038)  methylPREDNISolone sodium succinate (SOLU-MEDROL) 40 mg/mL injection 40 mg (has no administration in time range)  potassium chloride SA (KLOR-CON M) CR tablet 40 mEq (has no administration in time range)  magnesium oxide (MAG-OX) tablet 400 mg (has no administration in time range)  furosemide (LASIX) injection 40 mg (40 mg Intravenous Given 01/23/23 2207)  diphenhydrAMINE (BENADRYL) capsule 50 mg (50 mg Oral Given 01/24/23 1038)    Or  diphenhydrAMINE (BENADRYL) injection 50 mg ( Intravenous See Alternative 01/24/23 1038)    Mobility walks     Focused Assessments Cardiac Assessment Handoff:    No results found for: "CKTOTAL", "CKMB", "CKMBINDEX", "TROPONINI" Lab Results  Component Value Date   DDIMER 10.31 (H) 01/23/2023   Does the Patient currently have chest pain? Yes    R Recommendations: See Admitting Provider Note  Report given to:   Additional Notes: none

## 2023-01-24 NOTE — Assessment & Plan Note (Signed)
-  Resume losartan, monitor kidney function with PCP -Continue Toprol XL

## 2023-01-24 NOTE — Assessment & Plan Note (Signed)
-  Patient with known h/o chronic diastolic CHF presenting with worsening edema and anasarca -CXR consistent with vascular congestion -Mildly elevated BNP without known baseline -Also with afib with mild RVR -Markedly elevated D-dimer - negative CTA for PE -Minimally elevated troponin, likely due to demand ischemia. Cath 07/2021 with no significant CAD. 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

## 2023-01-24 NOTE — Progress Notes (Signed)
  Echocardiogram 2D Echocardiogram has been performed.  Parker [name] 01/24/2023, 2:02 PM

## 2023-01-24 NOTE — Assessment & Plan Note (Signed)
Continue Synthroid. Normal TSH °

## 2023-01-24 NOTE — Consult Note (Addendum)
Cardiology Consultation   Patient ID: Parker [name] MRN: [id number]; DOB: Oct 16, 1966  Admit date: 01/23/2023 Date of Consult: 01/24/2023  PCP:  Merryl Hacker No   [facility] Providers Cardiologist:  None   -- New to [facility]. Previously followed by [facility]   Patient Profile:   Parker [name] is a 57 y.o. male with a hx of atrial fibrillation, HFpEF, morbid obesity, gout, diabetes mellitus, hypothyroidism, history of LAA thrombus who is being seen 01/24/2023 for the evaluation of CHF, Atrial Fibrillation at the request of Dr. Lorin Mercy.  History of Present Illness:   Parker [name] is a 57 year old male with above medical history who has been followed by [facility] cardiology in the past. Per chart review, patient was admitted to [facility] in 08/2017 with paroxysmal atrial fibrillation. Underwent echocardiogram on 08/20/17 that showed LVEF 50-55%, normal RV systolic function, mildly dilated LA and RA. He underwent TEE on 08/20/17 that found a moderate size thrombus in the LAA. DCCV was not performed due to presence of thrombus on TEE. He was started on Xarelto. He was seen in follow up to discuss repeating TEE/DCCV, but patient was in NSR. However, in 03/2018, patient presented to [facility] hospital for evaluation of sudden onset palpitations. He was found to be in atrial flutter. Underwent TEE that showed no thrombus in LAA, and he underwent DCCV to normal sinus rhythm. He was started on flecainide. Echocardiogram 04/07/18 showed EF 62%. Exercise stress echo on 04/07/18 was negative for inducible ischemia at target HR. Stress ECG was also negative for inducible ischemia at target heart rate.   He was later seen by [facility] in 01/2020. Had an echocardiogram on 01/11/20 that showed EF >65%, findings consistent with hypertrophic obstructive cardiomyopathy with subtle chordal SAM with LVOT gradient of 30mHg. He had a confirmed diagnosis of sleep apnea but refused CPAP. [facility] and complained of symptoms of CHF. Echocardiogram 06/25/21 showed EF 55%, mild LVH, RV free wall was hypokinetic, mild MVR and TVR. He underwent nuclear stress testing that was abnormal with moderate inducible ischemia in the apical-mid inferior lateral wall segments. His flecanide was stopped, and he started amiodarone in 06/2021. Underwent LHC on 07/23/21 that showed mild plaque in mid LAD, normal ramus, normal left circumflex, normal RCA. There was slightly sluggish flow, suspicious for microvascular dysfunction.   [facility] and showed LVEF 45-50%, mild LVH, diastolic septal flattening consistent with RV volume overload.   Patient presented to an urgent care on on 2/22 complaining of pain and swelling in RLE and left hand. Also complained of abdominal swelling. Urgent care instructed patient to present to the ED for further evaluation. In the ED, patient complaining of nonradiating left sided chest pain that had been intermittent for the past 4-5 days. Also complained of shortness of breath, swelling in hands, feet, abdomen, and legs. EKG in the ED showed atrial fibrillation with HR elevated to 140 BPM. Labs in the ED showed hsTn 35>31. BNP elevated to 377.4. CXR showed stable, enlarged cardial silhouette and central vascular congestion.  D-Dimer elevated to 10.31. CTA chest for PE pending.   On interview, patient reports that he came to the hospital due to shortness of breath, abdominal distention, swelling in bilateral lower extremities and hands. He does have chronic lower extremity edema, but he has noticed it worsening over the past few weeks. Has noticed shortness of breath for the past 4  days. Shortness of breath comes and goes. He reports that his breathing gets worse when his HR becomes elevated while in afib, but his breathing returns to normal as his HR improves. He is able to tell when he is in atrial fibrillation because he feels more  fatigued, and gets tired and sob easier on exertion. Denies palpitations. Does have orthopnea. Has sharp chest pain that is present when taking deep breaths. No chest pain otherwise. Denies dizziness, syncope, near syncope.   Past Medical History:  Diagnosis Date   (HFpEF) heart failure with preserved ejection fraction (HCC)    Arthritis    knees. elbow, ankle   Diabetes mellitus without complication (HCC)    Dyspnea    when in A-fib   Dysrhythmia    A-fib   GERD (gastroesophageal reflux disease)    takes Dexilant prn   Hypertension    takes Lisinopril-HCTZ daily   Muscle spasm    takes Relafen daily   Peripheral edema    Plantar fasciitis    right    Past Surgical History:  Procedure Laterality Date   EAR CYST EXCISION N/A 08/06/2013   Procedure: CYST REMOVAL;  Surgeon: Ruby Cola, MD;  Location: [facility] OR;  Service: ENT;  Laterality: N/A;   ESOPHAGOGASTRODUODENOSCOPY     left knee arthroscopy  2003   PAROTIDECTOMY Right 08/06/2013   Procedure: PAROTIDECTOMY;  Surgeon: Ruby Cola, MD;  Location: [place];  Service: ENT;  Laterality: Right;   TONSILLECTOMY Bilateral 08/06/2013   Procedure: TONSILLECTOMY;  Surgeon: Ruby Cola, MD;  Location: [place];  Service: ENT;  Laterality: Bilateral;     Home Medications:  Prior to Admission medications   Medication Sig Start Date End Date Taking? Authorizing Provider  acetaminophen (TYLENOL) 500 MG tablet Take 500-1,000 mg by mouth every 8 (eight) hours as needed for moderate pain.   Yes [provider]  albuterol (VENTOLIN HFA) 108 (90 Base) MCG/ACT inhaler Inhale 2 puffs into the lungs every 6 (six) hours as needed. 09/18/22  Yes [provider]  amiodarone (PACERONE) 200 MG tablet Take 200 mg by mouth daily. 09/20/21  Yes [provider]  ELIQUIS 5 MG TABS tablet Take 5 mg by mouth 2 (two) times daily. 08/26/22  Yes [provider]  fluticasone (FLONASE) 50 MCG/ACT nasal spray Place 2 sprays into  both nostrils daily. 09/16/22  Yes Perlie Mayo, NP  folic acid (FOLVITE) 1 MG tablet Take 1 mg by mouth daily as needed (low iron). 07/07/22  Yes [provider]  levothyroxine (SYNTHROID) 50 MCG tablet Take 50 mcg by mouth daily. 09/11/22  Yes [provider]  losartan (COZAAR) 50 MG tablet Take 50 mg by mouth daily. 08/28/22  Yes [provider]  magnesium oxide (MAG-OX) 400 MG tablet Take 400 mg by mouth daily. 06/07/22  Yes [provider]  meclizine (ANTIVERT) 25 MG tablet Take 25 mg by mouth 3 (three) times daily as needed for dizziness. 07/07/22  Yes [provider]  metoprolol succinate (TOPROL-XL) 50 MG 24 hr tablet Take 50 mg by mouth daily. 08/16/22  Yes [provider]  Multiple Vitamins-Minerals (MULTIVITAMIN WITH MINERALS) tablet Take 1 tablet by mouth daily.   Yes [provider]  nitroGLYCERIN (NITROSTAT) 0.4 MG SL tablet Place 1 tablet under the tongue every 5 (five) minutes as needed for chest pain. 04/09/21  Yes [provider]  OVER THE COUNTER MEDICATION Apply 1 Application topically daily as needed (Pain). Magnilife Pain Relief Cream  Yes [provider]  potassium chloride SA (KLOR-CON M) 20 MEQ tablet Take 1 tablet (20 mEq total) by mouth 2 (two) times daily. Patient taking differently: Take 20 mEq by mouth at bedtime. 10/02/22  Yes Jaynee Eagles, PA-C  torsemide (DEMADEX) 20 MG tablet Take 20 mg by mouth 3 (three) times daily. 12/25/21  Yes [provider]  traZODone (DESYREL) 50 MG tablet Take 25 mg by mouth at bedtime as needed for sleep. 08/28/22  Yes [provider]  benzonatate (TESSALON) 100 MG capsule Take 1 capsule (100 mg total) by mouth 2 (two) times daily as needed for cough. Patient not taking: Reported on 01/24/2023 09/16/22   Perlie Mayo, NP  furosemide (LASIX) 40 MG tablet Take 2 tablets (80 mg total) by mouth daily. Patient not taking: Reported on 01/24/2023 10/02/22    Jaynee Eagles, PA-C    Inpatient Medications: Scheduled Meds:  amiodarone  200 mg Oral Daily   apixaban  5 mg Oral BID   folic acid  1 mg Oral Daily   furosemide  40 mg Intravenous BID   levothyroxine  50 mcg Oral Daily   magnesium oxide  400 mg Oral Once   methylPREDNISolone (SOLU-MEDROL) injection  20 mg Intravenous Q12H   methylPREDNISolone (SOLU-MEDROL) injection  40 mg Intravenous Once   metoprolol succinate  50 mg Oral Daily   potassium chloride  40 mEq Oral BID   sodium chloride flush  3 mL Intravenous Q12H   Continuous Infusions:  sodium chloride     PRN Meds: sodium chloride, acetaminophen, morphine injection, nitroGLYCERIN, ondansetron (ZOFRAN) IV, oxyCODONE, sodium chloride flush, traZODone  Allergies:    Allergies  Allergen Reactions   Iodinated Contrast Media Itching   Other Rash    Other reaction(s): Other (See Comments) Neoprene: Rash    Social History:   Social History   Socioeconomic History   Marital status: Single    Spouse name: Not on file   Number of children: Not on file   Years of education: Not on file   Highest education level: Not on file  Occupational History   Not on file  Tobacco Use   Smoking status: Never   Smokeless tobacco: Never  Vaping Use   Vaping Use: Never used  Substance and Sexual Activity   Alcohol use: Yes    Alcohol/week: 6.0 standard drinks of alcohol    Types: 6 Standard drinks or equivalent per week    Comment: occ   Drug use: No   Sexual activity: Yes  Other Topics Concern   Not on file  Social History Narrative   Not on file   Social Determinants of Health   Financial Resource Strain: Not on file  Food Insecurity: Not on file  Transportation Needs: Not on file  Physical Activity: Not on file  Stress: Not on file  Social Connections: Not on file  Intimate Partner Violence: Not on file    Family History:   History reviewed. No pertinent family history.   ROS:  Please see the history of present  illness.   All other ROS reviewed and negative.     Physical Exam/Data:   Vitals:   01/24/23 0718 01/24/23 0730 01/24/23 1030 01/24/23 1100  BP:  (!) 108/90 121/80   Pulse:  70 (!) 122   Resp:  (!) 21 17   Temp: 98.6 F (37 C)   98.1 F (36.7 C)  TempSrc: Oral   Oral  SpO2:  98% 99%   Weight:  Height:        Intake/Output Summary (Last 24 hours) at 01/24/2023 1244 Last data filed at 01/24/2023 1054 Gross per 24 hour  Intake --  Output 3500 ml  Net -3500 ml      01/23/2023    8:47 PM 01/23/2023    4:57 PM 03/11/2022    2:14 PM  Last 3 Weights  Weight (lbs) 297 lb 9.9 oz 287 lb 0.6 oz 287 lb  Weight (kg) 135 kg 130.2 kg 130.182 kg     Body mass index is 41.51 kg/m.  General:  Pleasant middle aged male. Laying in the bed with head elevated. No acute distress  HEENT: normal Neck: + JVD Vascular: Radial pulses 2+ bilaterally Cardiac:  normal S1, S2; irregular rate and rhythm  Lungs: Diminished breath sounds at lung bases  Abd: distended, tender to palpation  Ext: 3+ edema in BLE Musculoskeletal:  No deformities, BUE and BLE strength normal and equal Skin: warm and dry  Neuro:  CNs 2-12 intact, no focal abnormalities noted Psych:  Normal affect   EKG:  The EKG was personally reviewed and demonstrates:  atrial fibrillation with HR elevated to 140 BPM. Telemetry:  Telemetry was personally reviewed and demonstrates:  Atrial fibrillation, HR in the 90s-100s   Relevant CV Studies:   Laboratory Data:  High Sensitivity Troponin:   Recent Labs  Lab 01/23/23 1710 01/23/23 2037  TROPONINIHS 35* 31*     Chemistry Recent Labs  Lab 01/23/23 1710 01/23/23 2045 01/24/23 0206  NA 137  --  137  K 3.6  --  3.4*  CL 101  --  99  CO2 21*  --  26  GLUCOSE 102*  --  110*  BUN 20  --  21*  CREATININE 1.41* 1.39* 1.32*  CALCIUM 8.8*  --  8.8*  MG 1.7  --  1.7  GFRNONAA 58* 59* >60  ANIONGAP 15  --  12    Recent Labs  Lab 01/23/23 1710  PROT 7.4  ALBUMIN 3.6   AST 24  ALT 12  ALKPHOS 60  BILITOT 1.2   Lipids No results for input(s): "CHOL", "TRIG", "HDL", "LABVLDL", "LDLCALC", "CHOLHDL" in the last 168 hours.  Hematology Recent Labs  Lab 01/23/23 1710 01/23/23 2045 01/24/23 0206  WBC 6.5 7.0 6.0  RBC 5.48 5.64 5.63  HGB 11.8* 12.4* 12.2*  HCT 37.2* 38.0* 38.1*  MCV 67.9* 67.4* 67.7*  MCH 21.5* 22.0* 21.7*  MCHC 31.7 32.6 32.0  RDW 19.7* 20.1* 19.8*  PLT 228 231 215   Thyroid  Recent Labs  Lab 01/24/23 0447  TSH 3.562    BNP Recent Labs  Lab 01/23/23 1710  BNP 377.4*    DDimer  Recent Labs  Lab 01/23/23 2045  DDIMER 10.31*     Radiology/Studies:  DG Foot 2 Views Right  Result Date: 01/24/2023 CLINICAL DATA:  57 year old male with right great toe pain. EXAM: RIGHT FOOT - 2 VIEW COMPARISON:  Right foot series 01/08/2023. FINDINGS: Generalized soft tissue swelling not significantly changed from earlier this month. Underlying bone mineralization is within normal limits. Calcaneus appears intact with degenerative spurring. Joint spaces and alignment are maintained. No acute fracture, dislocation, or osteolysis identified. No soft tissue gas or radiopaque foreign body identified. IMPRESSION: Generalized soft tissue swelling not significantly changed from earlier this month. No acute osseous abnormality identified about the right foot. Electronically Signed   By: Genevie Ann M.D.   On: 01/24/2023 05:24   DG Hand 2 View  Left  Result Date: 01/23/2023 CLINICAL DATA:  Second and third digit swelling EXAM: LEFT HAND - 2 VIEW COMPARISON:  None Available. FINDINGS: Frontal and lateral views of the left hand are obtained. No fracture, subluxation, or dislocation. Mild joint space narrowing within the metacarpophalangeal and interphalangeal joints. There is dorsal soft tissue swelling throughout the hand. IMPRESSION: 1. Mild osteoarthritis. 2. Dorsal soft tissue swelling. 3. No acute fracture. Electronically Signed   By: Randa Ngo M.D.    On: 01/23/2023 21:25   DG Chest 2 View  Result Date: 01/23/2023 CLINICAL DATA:  Left chest pain, cough, short of breath, nausea EXAM: CHEST - 2 VIEW COMPARISON:  10/02/2022 FINDINGS: Frontal and lateral views of the chest demonstrates stable enlargement of the cardiac silhouette. Chronic central vascular congestion without airspace disease, effusion, or pneumothorax. No acute bony abnormalities. IMPRESSION: 1. Stable enlarged cardiac silhouette and central vascular congestion. 2. No acute airspace disease. Electronically Signed   By: Randa Ngo M.D.   On: 01/23/2023 18:01     Assessment and Plan:   Acute on Chronic HFmrEF - Most recent echocardiogram from 07/2022 showed EF 45-50%, mild LVH, RV systolic function mild-moderately reduced, moderate mitral valve regurgitation, moderate to severe tricuspid regurgitation  - Patient presented complaining of swelling in his hands, feet, abdomen, legs. Also complained of SOB and chest pain  - BNP was elevated to 377. CXR stable enlarged cardiac silhouette and central vascular congestion  - Patient is grossly volume overloaded on exam. Reports that his dry weight is approx 287 lbs. Was up to 297 lbs on admission  - Currently on IV lasix 40 mg BID. Agree with this dose  - Strict I/Os, daily weights. Needs daily BMP to follow renal function  - Echocardiogram pending this admission  - Continue metoprolol succinate 50 mg daily  - Home losartan held for now given AKI  - Further titrate GDMT as BP and renal function allow- hold off for now as patient's creatinine is slightly elevated to 1.32 and he is on IV lasix   Paroxysmal Atrial Fibrillation  - Patient presented with HR elevated to the 140s  - per telemetry, HR is well controlled and in the 90s-100s. Suspect HR will further improve as patient is diuresed  - Continue amiodarone 200 mg daily - patient reports that he was not taking this every day at home, and he would only take it when he felt that he  was in afib. Discussed importance of taking amiodarone every day  - Continue home metoprolol succinate 50 mg daily  - Continue eliquis 5 mg BID-- patient does admit to missing some doses. Cannot cardiovert without TEE  - Maintain K>4, mag >2. Ordered supplementation   Moderate mitral valve regurgitation  Moderate-severe Tricuspid Valve Regurgitation  - Noted on Echocardiogram at [facility] in 07/2022 - Echo this admission pending   Type 2 MI  - hsTn 35>31 - Patient does complain of some sharp chest pain, but pain is only present when he tries to take a deep breath.  - Cardiac catheter[facility] from 07/20[facility] at [facility] showed 20% stenosis in mid LAD, otherwise normal coronary arteries  - Suspect trop elevation is due to demand ischemia in the setting of volume overload, afib with RVR, AKI - Echo pending   Otherwise per primary  - Acute gouty flare - Hypothyroidism   Risk Assessment/Risk Scores:   New York Heart Association (NYHA) Functional Class NYHA Class IV  CHA2DS2-VASc Score = 4   This indicates a 4.8%  annual risk of stroke. The patient's score is based upon: CHF History: 1 HTN History: 1 Diabetes History: 1 Stroke History: 0 Vascular Disease History: 1 Age Score: 0 Gender Score: 0     For questions or updates, please contact [place] Please consult [email or web address] for contact info under    Signed, Margie Billet, PA-C  01/24/2023 12:44 PM  Personally seen and examined. Agree with above.  57 year old male with acute on chronic moderately reduced ejection fraction heart failure with shortness of breath.  Lower extremity edema noted.  Increased weight gain.  Previously RV function mild to moderately reduced.  Moderate to severe tricuspid regurgitation previously.  Agree with IV Lasix.  Getting CT angiogram of chest to rule out PE as well.  Paroxysmal atrial fibrillation once again noted.  Continue with amiodarone.  Reports that he was not taking it every day at home.   Continue Eliquis.  Echocardiogram 01/24/2023:   1. Right ventricular systolic function is moderately reduced. The right  ventricular size is severely enlarged. There is moderately elevated  pulmonary artery systolic pressure. The estimated right ventricular  systolic pressure is [id number]. Unknown  chronicity of RV failure, but findings concerning for possible PE and  recommend further work-up if clinically indicated.   2. Left ventricular ejection fraction, by estimation, is 55 to 60%. The  left ventricle has normal function. The left ventricle has no regional  wall motion abnormalities. There is mild concentric left ventricular  hypertrophy. Left ventricular diastolic  parameters are indeterminate. There is the interventricular septum is  flattened in systole, consistent with right ventricular pressure overload.   3. Left atrial size was moderately dilated.   4. Right atrial size was moderately dilated.   5. The mitral valve is grossly normal. Mild mitral valve regurgitation.   6. Tricuspid valve regurgitation is mild to moderate.   7. The aortic valve is tricuspid. There is mild calcification of the  aortic valve. There is mild thickening of the aortic valve. Aortic valve  regurgitation is not visualized. Aortic valve sclerosis/calcification is  present, without any evidence of  aortic stenosis.   8. The inferior vena cava is dilated in size with <50% respiratory  variability, suggesting right atrial pressure of 15 mmHg.   Mildly increased troponin secondary to type II myocardial infarction with atypical chest discomfort, shortness of breath.  Previous heart catheterization 2022 showed only 20% stenosis of mid LAD.  We will continue to monitor.  Candee Furbish, MD

## 2023-01-24 NOTE — Progress Notes (Incomplete)
Rounding Note    Patient Name: Parker [name] Date of Encounter: 01/24/2023  [place] Cardiologist: None ***  Subjective   ***  Inpatient Medications    Scheduled Meds:  amiodarone  200 mg Oral Daily   apixaban  5 mg Oral BID   fluticasone  2 spray Each Nare Daily   folic acid  1 mg Oral Daily   furosemide  40 mg Intravenous BID   levothyroxine  50 mcg Oral Daily   magnesium oxide  400 mg Oral Daily   methylPREDNISolone (SOLU-MEDROL) injection  20 mg Intravenous Q12H   metoprolol succinate  50 mg Oral Daily   potassium chloride  40 mEq Oral BID   sodium chloride flush  3 mL Intravenous Q12H   Continuous Infusions:  sodium chloride     PRN Meds: sodium chloride, acetaminophen, albuterol, iohexol, morphine injection, nitroGLYCERIN, ondansetron (ZOFRAN) IV, oxyCODONE, sodium chloride flush, traZODone   Vital Signs    Vitals:   01/24/23 1500 01/24/23 1524 01/24/23 1852 01/24/23 2035  BP: 126/75 (!) 114/91 122/89 110/72  Pulse: (!) 124 (!) 109 (!) 108 (!) 111  Resp: '20 20 18   '$ Temp: 98.5 F (36.9 C) 97.6 F (36.4 C) 97.6 F (36.4 C) 97.8 F (36.6 C)  TempSrc: Oral Oral Oral Oral  SpO2: 100% 97% 99% 95%  Weight:  134.4 kg    Height:  '5\' 11"'$  (1.803 m)      Intake/Output Summary (Last 24 hours) at 01/24/2023 2043 Last data filed at 01/24/2023 1054 Gross per 24 hour  Intake --  Output 3500 ml  Net -3500 ml      01/24/2023    3:24 PM 01/23/2023    8:47 PM 01/23/2023    4:57 PM  Last 3 Weights  Weight (lbs) 296 lb 4.8 oz 297 lb 9.9 oz 287 lb 0.6 oz  Weight (kg) 134.4 kg 135 kg 130.2 kg      Telemetry    *** - Personally Reviewed  ECG    *** - Personally Reviewed  Physical Exam  *** GEN: No acute distress.   Neck: No JVD Cardiac: RRR, no murmurs, rubs, or gallops.  Respiratory: Clear to auscultation bilaterally. GI: Soft, nontender, non-distended  MS: No edema; No deformity. Neuro:  Nonfocal  Psych: Normal affect   Labs    High  Sensitivity Troponin:   Recent Labs  Lab 01/23/23 1710 01/23/23 2037  TROPONINIHS 35* 31*     Chemistry Recent Labs  Lab 01/23/23 1710 01/23/23 2045 01/24/23 0206  NA 137  --  137  K 3.6  --  3.4*  CL 101  --  99  CO2 21*  --  26  GLUCOSE 102*  --  110*  BUN 20  --  21*  CREATININE 1.41* 1.39* 1.32*  CALCIUM 8.8*  --  8.8*  MG 1.7  --  1.7  PROT 7.4  --   --   ALBUMIN 3.6  --   --   AST 24  --   --   ALT 12  --   --   ALKPHOS 60  --   --   BILITOT 1.2  --   --   GFRNONAA 58* 59* >60  ANIONGAP 15  --  12    Lipids No results for input(s): "CHOL", "TRIG", "HDL", "LABVLDL", "LDLCALC", "CHOLHDL" in the last 168 hours.  Hematology Recent Labs  Lab 01/23/23 1710 01/23/23 2045 01/24/23 0206  WBC 6.5 7.0 6.0  RBC  5.48 5.64 5.63  HGB 11.8* 12.4* 12.2*  HCT 37.2* 38.0* 38.1*  MCV 67.9* 67.4* 67.7*  MCH 21.5* 22.0* 21.7*  MCHC 31.7 32.6 32.0  RDW 19.7* 20.1* 19.8*  PLT 228 231 215   Thyroid  Recent Labs  Lab 01/24/23 0447  TSH 3.562    BNP Recent Labs  Lab 01/23/23 1710  BNP 377.4*    DDimer  Recent Labs  Lab 01/23/23 2045  DDIMER 10.31*     Radiology    CT Angio Chest Pulmonary Embolism (PE) W or WO Contrast  Result Date: 01/24/2023 CLINICAL DATA:  Pulmonary embolus suspected with high probability. Pain and swelling to the right lower extremity and left hand. Abdominal pain and swelling for 5 days. EXAM: CT ANGIOGRAPHY CHEST WITH CONTRAST TECHNIQUE: Multidetector CT imaging of the chest was performed using the standard protocol during bolus administration of intravenous contrast. Multiplanar CT image reconstructions and MIPs were obtained to evaluate the vascular anatomy. RADIATION DOSE REDUCTION: This exam was performed according to the departmental dose-optimization program which includes automated exposure control, adjustment of the mA and/or kV according to patient size and/or use of iterative reconstruction technique. CONTRAST:  81m OMNIPAQUE IOHEXOL  350 MG/ML SOLN COMPARISON:  Chest radiograph 01/23/2023 FINDINGS: Cardiovascular: There is moderately good opacification of the central and proximal segmental pulmonary arteries. Distal segmental and subsegmental vessels are poorly visualized due to somewhat weak contrast bolus. Visualized pulmonary arteries are patent without evidence of significant central pulmonary embolus. Diffuse cardiac enlargement with reflux of contrast material into the hepatic veins suggesting right heart failure. No pericardial effusion. Normal caliber thoracic aorta. Mediastinum/Nodes: Esophagus is decompressed. No significant lymphadenopathy. Thyroid gland is unremarkable. Lungs/Pleura: Motion artifact. Probable linear atelectasis in the lung bases. No edema or consolidation suggested. No pleural effusions. No pneumothorax. Upper Abdomen: Diffuse liver enlargement. Small amount of ascites around the liver edge. Musculoskeletal: Degenerative changes in the spine. Review of the MIP images confirms the above findings. IMPRESSION: 1. No evidence of significant central pulmonary embolus. 2. Cardiac enlargement with evidence of right heart failure. 3. Lungs are clear. 4. Liver is diffusely enlarged.  Small amount of abdominal ascites. Electronically Signed   By: WLucienne CapersM.D.   On: 01/24/2023 16:51   ECHOCARDIOGRAM COMPLETE  Result Date: 01/24/2023    ECHOCARDIOGRAM REPORT   Patient Name:   Parker GAZDADate of Exam: 01/24/2023 Medical Rec #:  [id number]     Height:       71.0 in Accession #:    [id number]    Weight:       297.6 lb Date of Birth:  [birth year]    BSA:          2.497 m Patient Age:    89years       BP:           121/80 mmHg Patient Gender: M              HR:           109 bpm. Exam Location:  Inpatient Procedure: 2D Echo Indications:    acute diastolic chf  History:        Patient has no prior history of Echocardiogram examinations.                 Arrythmias:Atrial Fibrillation.  Sonographer:    LJohny ChessRDCS Referring Phys: [id number] 1. Right ventricular systolic function is moderately reduced. The right ventricular size  is severely enlarged. There is moderately elevated pulmonary artery systolic pressure. The estimated right ventricular systolic pressure is [id number] mmHg. Unknown chronicity of RV failure, but findings concerning for possible PE and recommend further work-up if clinically indicated.  2. Left ventricular ejection fraction, by estimation, is 55 to 60%. The left ventricle has normal function. The left ventricle has no regional wall motion abnormalities. There is mild concentric left ventricular hypertrophy. Left ventricular diastolic parameters are indeterminate. There is the interventricular septum is flattened in systole, consistent with right ventricular pressure overload.  3. Left atrial size was moderately dilated.  4. Right atrial size was moderately dilated.  5. The mitral valve is grossly normal. Mild mitral valve regurgitation.  6. Tricuspid valve regurgitation is mild to moderate.  7. The aortic valve is tricuspid. There is mild calcification of the aortic valve. There is mild thickening of the aortic valve. Aortic valve regurgitation is not visualized. Aortic valve sclerosis/calcification is present, without any evidence of aortic stenosis.  8. The inferior vena cava is dilated in size with <50% respiratory variability, suggesting right atrial pressure of 15 mmHg. Comparison(s): No prior Echocardiogram. FINDINGS  Left Ventricle: Left ventricular ejection fraction, by estimation, is 55 to 60%. The left ventricle has normal function. The left ventricle has no regional wall motion abnormalities. The left ventricular internal cavity size was normal in size. There is  mild concentric left ventricular hypertrophy. The interventricular septum is flattened in systole, consistent with right ventricular pressure overload. Left ventricular diastolic parameters are  indeterminate. Right Ventricle: The right ventricular size is severely enlarged. No increase in right ventricular wall thickness. Right ventricular systolic function is moderately reduced. There is moderately elevated pulmonary artery systolic pressure. The tricuspid regurgitant velocity is 3.12 m/s, and with an assumed right atrial pressure of 15 mmHg, the estimated right ventricular systolic pressure is [id number] mmHg. Left Atrium: Left atrial size was moderately dilated. Right Atrium: Right atrial size was moderately dilated. Pericardium: There is no evidence of pericardial effusion. Mitral Valve: The mitral valve is grossly normal. There is mild thickening of the mitral valve leaflet(s). There is mild calcification of the mitral valve leaflet(s). Mild mitral valve regurgitation. Tricuspid Valve: The tricuspid valve is normal in structure. Tricuspid valve regurgitation is mild to moderate. Aortic Valve: The aortic valve is tricuspid. There is mild calcification of the aortic valve. There is mild thickening of the aortic valve. Aortic valve regurgitation is not visualized. Aortic valve sclerosis/calcification is present, without any evidence of aortic stenosis. Pulmonic Valve: The pulmonic valve was normal in structure. Pulmonic valve regurgitation is trivial. Aorta: The aortic root and ascending aorta are structurally normal, with no evidence of dilitation. Venous: The inferior vena cava is dilated in size with less than 50% respiratory variability, suggesting right atrial pressure of 15 mmHg. IAS/Shunts: The atrial septum is grossly normal.  LEFT VENTRICLE PLAX 2D LVIDd:         4.40 cm LVIDs:         3.40 cm LV PW:         1.30 cm LV IVS:        1.30 cm LVOT diam:     2.00 cm LVOT Area:     3.14 cm  RIGHT VENTRICLE          IVC RV Basal diam:  3.70 cm  IVC diam: 3.10 cm LEFT ATRIUM            Index        RIGHT ATRIUM  Index LA diam:      4.30 cm  1.72 cm/m   RA Area:     26.10 cm LA Vol (A2C): 61.5 ml   24.63 ml/m  RA Volume:   80.80 ml  32.36 ml/m LA Vol (A4C): 111.0 ml 44.46 ml/m   AORTA Ao Root diam: 2.90 cm Ao Asc diam:  3.40 cm TRICUSPID VALVE TR Peak grad:   38.9 mmHg TR Vmax:        312.00 cm/s  SHUNTS Systemic Diam: 2.00 cm Gwyndolyn Kaufman MD Electronically signed by Gwyndolyn Kaufman MD Signature Date/Time: 01/24/2023/3:10:14 PM    Final    DG Foot 2 Views Right  Result Date: 01/24/2023 CLINICAL DATA:  57 year old male with right great toe pain. EXAM: RIGHT FOOT - 2 VIEW COMPARISON:  Right foot series 01/08/2023. FINDINGS: Generalized soft tissue swelling not significantly changed from earlier this month. Underlying bone mineralization is within normal limits. Calcaneus appears intact with degenerative spurring. Joint spaces and alignment are maintained. No acute fracture, dislocation, or osteolysis identified. No soft tissue gas or radiopaque foreign body identified. IMPRESSION: Generalized soft tissue swelling not significantly changed from earlier this month. No acute osseous abnormality identified about the right foot. Electronically Signed   By: Genevie Ann M.D.   On: 01/24/2023 05:24   DG Hand 2 View Left  Result Date: 01/23/2023 CLINICAL DATA:  Second and third digit swelling EXAM: LEFT HAND - 2 VIEW COMPARISON:  None Available. FINDINGS: Frontal and lateral views of the left hand are obtained. No fracture, subluxation, or dislocation. Mild joint space narrowing within the metacarpophalangeal and interphalangeal joints. There is dorsal soft tissue swelling throughout the hand. IMPRESSION: 1. Mild osteoarthritis. 2. Dorsal soft tissue swelling. 3. No acute fracture. Electronically Signed   By: Randa Ngo M.D.   On: 01/23/2023 21:25   DG Chest 2 View  Result Date: 01/23/2023 CLINICAL DATA:  Left chest pain, cough, short of breath, nausea EXAM: CHEST - 2 VIEW COMPARISON:  10/02/2022 FINDINGS: Frontal and lateral views of the chest demonstrates stable enlargement of the cardiac  silhouette. Chronic central vascular congestion without airspace disease, effusion, or pneumothorax. No acute bony abnormalities. IMPRESSION: 1. Stable enlarged cardiac silhouette and central vascular congestion. 2. No acute airspace disease. Electronically Signed   By: Randa Ngo M.D.   On: 01/23/2023 18:01    Cardiac Studies     Patient Profile     57 y.o. male chronic systolic HF with mildly reduced EF, right heart failure, paroxysmal Afib, moderate MR, moderate to severe TR, and DMII who presented with chest pain, SOB and LE edema found to be in Afib with RVR. Cardiology is now consulted for further management of acute on chronic biventricular heart failure and Afib.  Assessment & Plan    #Acute on Chronic Biventricular HF:  #Paroxysmal Afib with RVR:  #Type II MI:  #Moderate MR: #Moderate TR:  {Are we signing off today?:[id number]}  For questions or updates, please contact [place] Please consult [email or web address] for contact info under        Signed, Freada Bergeron, MD  01/24/2023, 8:43 PM

## 2023-01-24 NOTE — Assessment & Plan Note (Signed)
-  Rate controlled at home with amiodarone and Toprol XL - continued -He reports that he is only symptomatic when his HR is >160 -Needs improved control but he is unwilling to remain hospitalized for longer to achieve this -Continue Eliquis, although he prefers to change to Xarelto if possible because he is not always compliant with the afternoon dose at home

## 2023-01-24 NOTE — Assessment & Plan Note (Addendum)
-  Reports acute flare currently -Was on solumedrol, will transition to PO prednisone -Unremarkable hand and foot xray -Will add colchicine for prn use -Needs allopurinol once acute gouty flare has passed with goal uric acid level <7

## 2023-01-24 NOTE — Progress Notes (Signed)
   Heart Failure Stewardship Pharmacist Progress Note   PCP: Pcp, No PCP-Cardiologist: None    HPI:  57 yo male with PMH significant for CHF, Afib, T2DM, hypothyroidism, gout, and HTN. He presented to the ED on 2/22 with complaints of 4-5 days of pain/swelling of the LE, swelling of the abdomen. Endorsed orthopnea and SOB with exertion and at rest. He reported ~40 lbs of weight gain over the last 6 months. Patient reported that he was on furosemide in the past but had been changed to torsemide in late 2023 because the furosemide was "no longer working."  CXR on 01/23/23 showed stable enlarged cardiac silhouette and ventral vascular congestion. ECHO pending. ECHO in 07/2022 showed EF 45-50%.   Current HF Medications: Diuretic: furosemide 33m IV BID Beta Blocker: metoprolol succinate 578monce daily  Prior to admission HF Medications: Diuretic: torsemide 2057mID Beta blocker: metoprolol succinate 49m45mily ACE/ARB/ARNI: losartan 49mg72me daily  Pertinent Lab Values: Serum creatinine 1.32, BUN 21, Potassium 3.4, Sodium 137, BNP 377.4, Magnesium 1.7  Vital Signs: Weight: 287 lbs Blood pressure: 110-140/80-100s   Heart rate: 80-100s  I/O: -1.4L yesterday; net -3.5L  Medication Assistance / Insurance Benefits Check: Does the patient have prescription insurance?  Yes Type of insurance plan: [employer] Medicare  Does the patient qualify for medication assistance through manufacturers or grants?   Pending Eligible grants and/or patient assistance programs: pending Medication assistance applications in progress: none  Medication assistance applications approved: none Approved medication assistance renewals will be completed by: pending  Outpatient Pharmacy:  Prior to admission outpatient pharmacy: [facility] Is the patient willing to use [facility]? Yes Is the patient willing to transition their outpatient pharmacy [facility]?   Pending    Assessment: 1. Acute on chronic diastolic CHF (LVEF [id number] class IV symptoms. - Volume overloaded; continue IV diuretic at this time - Indicated for SGLT2i therapy. Obtain an A1c to rule out potential risk of GU. Recommend initiation tomorrow of Jardiance pending renal function - Small jump in Scr. Would recommend reinitiation of RAAS therapy in the next few days - Recommend MRA therapy later during this hospitalization -Daily weights. Keep K>4 and Mg>2. Strict I/Os   Plan: 1) Medication changes recommended at this time: - none at this time  2) Patient assistance: -Jardiance copay: $47 -FarxWilder Gladey: $100 -EntrDelene Lolly: $47  [place] Education  - Initial education provided - Full education to be completed prior to discharge  AllysMaryan PulsrmD PGY-1 [facility] Resident

## 2023-01-25 DIAGNOSIS — I50813 Acute on chronic right heart failure: Secondary | ICD-10-CM | POA: Diagnosis not present

## 2023-01-25 DIAGNOSIS — E66813 Obesity, class 3: Secondary | ICD-10-CM | POA: Diagnosis present

## 2023-01-25 LAB — BASIC METABOLIC PANEL
Anion gap: 12 (ref 5–15)
BUN: 23 mg/dL — ABNORMAL HIGH (ref 6–20)
CO2: 23 mmol/L (ref 22–32)
Calcium: 9 mg/dL (ref 8.9–10.3)
Chloride: 100 mmol/L (ref 98–111)
Creatinine, Ser: 1.23 mg/dL (ref 0.61–1.24)
GFR, Estimated: >60 mL/min (ref >60)
Glucose, Bld: 173 mg/dL — ABNORMAL HIGH (ref 70–99)
Potassium: 3.8 mmol/L (ref 3.5–5.1)
Sodium: 135 mmol/L (ref 135–145)

## 2023-01-25 LAB — HEMOGLOBIN A1C
Hgb A1c MFr Bld: 6.1 % — ABNORMAL HIGH (ref 4.8–5.6)
Mean Plasma Glucose: 128.37 mg/dL

## 2023-01-25 MED ORDER — POTASSIUM CHLORIDE CRYS ER 20 MEQ PO TBCR
40.0000 meq | EXTENDED_RELEASE_TABLET | Freq: Once | ORAL | Status: AC
  Administered 2023-01-25: 40 meq via ORAL
  Filled 2023-01-25: qty 2

## 2023-01-25 MED ORDER — SPIRONOLACTONE 12.5 MG HALF TABLET
12.5000 mg | ORAL_TABLET | Freq: Every day | ORAL | Status: DC
  Administered 2023-01-25: 12.5 mg via ORAL
  Filled 2023-01-25: qty 1

## 2023-01-25 MED ORDER — POTASSIUM CHLORIDE CRYS ER 20 MEQ PO TBCR: 40.0000 meq | EXTENDED_RELEASE_TABLET | Freq: Two times a day (BID) | ORAL | 1 refills | Status: DC

## 2023-01-25 MED ORDER — AMIODARONE HCL 200 MG PO TABS: 200.0000 mg | ORAL_TABLET | Freq: Two times a day (BID) | ORAL | Status: DC

## 2023-01-25 MED ORDER — PREDNISONE 10 MG PO TABS: 10.0000 mg | ORAL_TABLET | Freq: Every day | ORAL | 0 refills | Status: AC

## 2023-01-25 MED ORDER — ROSUVASTATIN CALCIUM 10 MG PO TABS: 10.0000 mg | ORAL_TABLET | Freq: Every day | ORAL | 1 refills | Status: DC

## 2023-01-25 MED ORDER — AMIODARONE HCL 200 MG PO TABS: 200.0000 mg | ORAL_TABLET | Freq: Two times a day (BID) | ORAL | 1 refills | Status: DC

## 2023-01-25 MED ORDER — ROSUVASTATIN CALCIUM 5 MG PO TABS
10.0000 mg | ORAL_TABLET | Freq: Every day | ORAL | Status: DC
  Administered 2023-01-25: 10 mg via ORAL
  Filled 2023-01-25: qty 2

## 2023-01-25 MED ORDER — TRAMADOL HCL 50 MG PO TABS: 50.0000 mg | ORAL_TABLET | Freq: Four times a day (QID) | ORAL | 0 refills | Status: DC | PRN

## 2023-01-25 MED ORDER — COLCHICINE 0.6 MG PO TABS: 0.6000 mg | ORAL_TABLET | Freq: Every day | ORAL | 1 refills | Status: DC | PRN

## 2023-01-25 MED ORDER — TORSEMIDE 20 MG PO TABS: 40.0000 mg | ORAL_TABLET | Freq: Two times a day (BID) | ORAL | 1 refills | Status: DC

## 2023-01-25 MED ORDER — FUROSEMIDE 10 MG/ML IJ SOLN
80.0000 mg | Freq: Once | INTRAMUSCULAR | Status: AC
  Administered 2023-01-25: 80 mg via INTRAVENOUS
  Filled 2023-01-25: qty 8

## 2023-01-25 MED ORDER — SPIRONOLACTONE 25 MG PO TABS: 12.5000 mg | ORAL_TABLET | Freq: Every day | ORAL | 1 refills | Status: DC

## 2023-01-25 NOTE — Assessment & Plan Note (Signed)
-  Body mass index is 41.17 kg/m..  -Weight loss should be encouraged -Outpatient PCP/bariatric medicine/bariatric surgery f/u encouraged

## 2023-01-25 NOTE — Discharge Summary (Signed)
Physician Discharge Summary   Patient: Parker [name] MRN: [id number] DOB: October 18, 1966  Admit date:     01/23/2023  Discharge date: 01/30/23  Discharge Physician: Karmen Bongo   PCP: Pcp, No   Recommendations at discharge:   Return to ER as needed for SOB, afib with rapid heart rate Follow up with cardiology clinic as instructed Increase torsemide as instructed Take colchicine as needed 1-2 times daily for gout Ask PCP for allopurinol with goal uric acid <7 Follow up with PCP this week for repeat labs  Discharge Diagnoses: Principal Problem:   Acute on chronic right-sided congestive heart failure ([place]) Active Problems:   AKI (acute kidney injury) ([place])   Controlled type 2 diabetes mellitus without complication, without long-term current use of insulin (HCC)   Atrial fibrillation with RVR ([place])   HTN (hypertension)   Hypothyroidism   Gout   Obesity, Class III, BMI 40-49.9 (morbid obesity) [facility])    Hospital Course: Patient with h/o gout, chronic diastolic CHF, DM, afib, hypothyroidism, and morbid obesity presenting with anasarca, orthopnea. He was diagnosed with acute on chronic CHF and started on Lasix with cardiology consult and echo ordered.  Echo showed severe R heart failure.  With abnormal echo and markedly elevated D-dimer, CTA was ordered and was negative for PE.  Patient continues to have marked volume overload and afib with RVR but insists on discharge, recognizing the high risk associated with discharge at this time.  Assessment and Plan: * Acute on chronic right-sided congestive heart failure ([place]) -Patient with known h/o chronic diastolic CHF presenting with worsening edema and anasarca -CXR consistent with vascular congestion -Mildly elevated BNP without known baseline -Also with afib with mild RVR -Markedly elevated D-dimer - negative CTA for PE -Minimally elevated troponin, likely due to demand ischemia. Cath 07/2021 with no significant CAD. -Also with  limited LE evaluation due to elephantiasis -PE ruled out but echo showed marked R-sided heart failure -Continue diuresis - he remains markedly volume overloaded with anasarca but refuses to remain hospitalized so will attempt to manage outpatient but he is at very high risk for rehospitalization  Obesity, Class III, BMI 40-49.9 (morbid obesity) ([place]) -Body mass index is 41.17 kg/m..  -Weight loss should be encouraged -Outpatient PCP/bariatric medicine/bariatric surgery f/u encouraged   Gout -Reports acute flare currently -Was on solumedrol, will transition to PO prednisone -Unremarkable hand and foot xray -Will add colchicine for prn use -Needs allopurinol once acute gouty flare has passed with goal uric acid level <7  Hypothyroidism -Continue Synthroid -Normal TSH  HTN (hypertension) -Resume losartan, monitor kidney function with PCP -Continue Toprol XL  Atrial fibrillation with RVR (HCC) -Rate controlled at home with amiodarone and Toprol XL - continued -He reports that he is only symptomatic when his HR is >160 -Needs improved control but he is unwilling to remain hospitalized for longer to achieve this -Continue Eliquis, although he prefers to change to Xarelto if possible because he is not always compliant with the afternoon dose at home  Controlled type 2 diabetes mellitus without complication, without long-term current use of insulin (HCC) -A1c is 6.1 -Diet controlled  AKI (acute kidney injury) ([place]) -Patient likely has baseline stage 3a CKD -Appears to have returned to baseline       Consultants: Cardiology Procedures performed: Echo, CTA  Disposition: Home Diet recommendation:  Cardiac and Carb modified diet DISCHARGE MEDICATION: Allergies as of 01/25/2023       Reactions   Iodinated Contrast Media Itching   Other Rash  Other reaction(s): Other (See Comments) Neoprene: Rash        Medication List     STOP taking these medications    OVER THE  COUNTER MEDICATION       TAKE these medications    acetaminophen 500 MG tablet Commonly known as: TYLENOL Take 500-1,000 mg by mouth every 8 (eight) hours as needed for moderate pain.   albuterol 108 (90 Base) MCG/ACT inhaler Commonly known as: VENTOLIN HFA Inhale 2 puffs into the lungs every 6 (six) hours as needed.   amiodarone 200 MG tablet Commonly known as: PACERONE Take 1 tablet (200 mg total) by mouth 2 (two) times daily. What changed: when to take this   colchicine 0.6 MG tablet Take 1 tablet (0.6 mg total) by mouth daily as needed (gout flares).   Eliquis 5 MG Tabs tablet Generic drug: apixaban Take 5 mg by mouth 2 (two) times daily.   fluticasone 50 MCG/ACT nasal spray Commonly known as: FLONASE Place 2 sprays into both nostrils daily.   folic acid 1 MG tablet Commonly known as: FOLVITE Take 1 mg by mouth daily as needed (low iron).   levothyroxine 50 MCG tablet Commonly known as: SYNTHROID Take 50 mcg by mouth daily.   losartan 50 MG tablet Commonly known as: COZAAR Take 50 mg by mouth daily.   magnesium oxide 400 MG tablet Commonly known as: MAG-OX Take 400 mg by mouth daily.   meclizine 25 MG tablet Commonly known as: ANTIVERT Take 25 mg by mouth 3 (three) times daily as needed for dizziness.   metoprolol succinate 50 MG 24 hr tablet Commonly known as: TOPROL-XL Take 50 mg by mouth daily.   multivitamin with minerals tablet Take 1 tablet by mouth daily.   nitroGLYCERIN 0.4 MG SL tablet Commonly known as: NITROSTAT Place 1 tablet under the tongue every 5 (five) minutes as needed for chest pain.   potassium chloride SA 20 MEQ tablet Commonly known as: KLOR-CON M Take 2 tablets (40 mEq total) by mouth 2 (two) times daily. What changed: how much to take   predniSONE 10 MG tablet Commonly known as: DELTASONE Take 1 tablet (10 mg total) by mouth daily for 5 days.   rosuvastatin 10 MG tablet Commonly known as: CRESTOR Take 1 tablet (10 mg  total) by mouth daily.   spironolactone 25 MG tablet Commonly known as: ALDACTONE Take 0.5 tablets (12.5 mg total) by mouth daily.   torsemide 20 MG tablet Commonly known as: DEMADEX Take 2 tablets (40 mg total) by mouth 2 (two) times daily. What changed:  how much to take when to take this   traMADol 50 MG tablet Commonly known as: Ultram Take 1 tablet (50 mg total) by mouth every 6 (six) hours as needed.   traZODone 50 MG tablet Commonly known as: DESYREL Take 25 mg by mouth at bedtime as needed for sleep.        Follow-up Information     [facility]. Go to.   Specialty: Cardiology Why: Friday, March 15 @ 10AM for [facility] appointment [place]. Bring all medications with you. FREE VALET parking at [employer], off [address]. Near [address] [employer]. Contact information: [address] [id number] [place] [phone]               Discharge Exam: [facility] Weights   01/23/23 2047 01/24/23 1524 01/25/23 0346  Weight: 135 kg 134.4 kg  133.9 kg   General:  Appears calm and comfortable and is in NAD Eyes:   EOMI, normal lids, iris ENT:  grossly normal hearing, lips & tongue, mmm Neck:  no LAD, masses or thyromegaly Cardiovascular:  Irregularly irregular with tachycardia to 130s. Marked LE edema, mildly improved.  Respiratory:   CTA bilaterally with no wheezes/rales/rhonchi.  Normal respiratory effort. Abdomen:  NT, ND, persistent abdominal wall edema Skin:  no rash or induration seen on limited exam Musculoskeletal:  grossly normal tone BUE/BLE, good ROM, no bony abnormality, +elephantiasis Psychiatric:  grossly normal mood and affect, speech fluent and appropriate, AOx3 Neurologic:  CN 2-12 grossly intact, moves all extremities in coordinated fashion    EKG: none today   Labs on Admission: I have personally reviewed the available labs and imaging  studies at the time of the admission.  Pertinent labs:    Glucose 173 A1c 6.1  Condition at discharge: improving - patient continues to need hospitalization but refuses to stay longer at this time  The results of significant diagnostics from this hospitalization (including imaging, microbiology, ancillary and laboratory) are listed below for reference.   Imaging Studies: ECHOCARDIOGRAM COMPLETE  Result Date: 01/24/2023    ECHOCARDIOGRAM REPORT   Patient Name:   Parker [name] Date of Exam: 01/24/2023 Medical Rec #:  [id number]      Height:       71.0 in Accession #:    [id number]     Weight:       297.6 lb Date of Birth:  03-13-1966     BSA:          2.497 m Patient Age:    57 years       BP:           121/80 mmHg Patient Gender: M              HR:           109 bpm. Exam Location:  Inpatient Procedure: 2D Echo Indications:     acute diastolic chf  History:         Patient has no prior history of Echocardiogram examinations.                  Arrythmias:Atrial Fibrillation.  Sonographer:     Johny Chess RDCS Referring Phys:  [phone] DEBBY CROSLEY Diagnosing Phys: Gwyndolyn Kaufman MD IMPRESSIONS  1. Right ventricular systolic function is moderately reduced. The right ventricular size is severely enlarged. There is moderately elevated pulmonary artery systolic pressure. The estimated right ventricular systolic pressure is [id number]. Unknown chronicity of RV failure, but findings concerning for possible PE and recommend further work-up if clinically indicated.  2. Left ventricular ejection fraction, by estimation, is 55 to 60%. The left ventricle has normal function. The left ventricle has no regional wall motion abnormalities. There is mild concentric left ventricular hypertrophy. Left ventricular diastolic parameters are indeterminate. There is the interventricular septum is flattened in systole, consistent with right ventricular pressure overload.  3. Left atrial size was moderately dilated.  4.  Right atrial size was moderately dilated.  5. The mitral valve is grossly normal. Mild mitral valve regurgitation.  6. Tricuspid valve regurgitation is moderate.  7. The aortic valve is tricuspid. There is mild calcification of the aortic valve. There is mild thickening of the aortic valve. Aortic valve regurgitation is not visualized. Aortic valve sclerosis/calcification is present, without any evidence of aortic stenosis.  8. The inferior vena cava is dilated in  size with <50% respiratory variability, suggesting right atrial pressure of 15 mmHg. Comparison(s): No prior Echocardiogram. FINDINGS  Left Ventricle: Left ventricular ejection fraction, by estimation, is 55 to 60%. The left ventricle has normal function. The left ventricle has no regional wall motion abnormalities. The left ventricular internal cavity size was normal in size. There is  mild concentric left ventricular hypertrophy. The interventricular septum is flattened in systole, consistent with right ventricular pressure overload. Left ventricular diastolic parameters are indeterminate. Right Ventricle: The right ventricular size is severely enlarged. No increase in right ventricular wall thickness. Right ventricular systolic function is moderately reduced. There is moderately elevated pulmonary artery systolic pressure. The tricuspid regurgitant velocity is 3.12 m/s, and with an assumed right atrial pressure of 15 mmHg, the estimated right ventricular systolic pressure is [id number] mmHg. Left Atrium: Left atrial size was moderately dilated. Right Atrium: Right atrial size was moderately dilated. Pericardium: There is no evidence of pericardial effusion. Mitral Valve: The mitral valve is grossly normal. There is mild thickening of the mitral valve leaflet(s). There is mild calcification of the mitral valve leaflet(s). Mild mitral valve regurgitation. Tricuspid Valve: The tricuspid valve is normal in structure. Tricuspid valve regurgitation is moderate.  Aortic Valve: The aortic valve is tricuspid. There is mild calcification of the aortic valve. There is mild thickening of the aortic valve. Aortic valve regurgitation is not visualized. Aortic valve sclerosis/calcification is present, without any evidence of aortic stenosis. Pulmonic Valve: The pulmonic valve was normal in structure. Pulmonic valve regurgitation is trivial. Aorta: The aortic root and ascending aorta are structurally normal, with no evidence of dilitation. Venous: The inferior vena cava is dilated in size with less than 50% respiratory variability, suggesting right atrial pressure of 15 mmHg. IAS/Shunts: The atrial septum is grossly normal.  LEFT VENTRICLE PLAX 2D LVIDd:         4.40 cm LVIDs:         3.40 cm LV PW:         1.30 cm LV IVS:        1.30 cm LVOT diam:     2.00 cm LVOT Area:     3.14 cm  RIGHT VENTRICLE          IVC RV Basal diam:  3.70 cm  IVC diam: 3.10 cm LEFT ATRIUM            Index        RIGHT ATRIUM           Index LA diam:      4.30 cm  1.72 cm/m   RA Area:     26.10 cm LA Vol (A2C): 61.5 ml  24.63 ml/m  RA Volume:   80.80 ml  32.36 ml/m LA Vol (A4C): 111.0 ml 44.46 ml/m   AORTA Ao Root diam: 2.90 cm Ao Asc diam:  3.40 cm TRICUSPID VALVE TR Peak grad:   38.9 mmHg TR Vmax:        312.00 cm/s  SHUNTS Systemic Diam: 2.00 cm Gwyndolyn Kaufman MD Electronically signed by Gwyndolyn Kaufman MD Signature Date/Time: 01/24/2023/3:10:14 PM    Final (Updated)    CT Angio Chest Pulmonary Embolism (PE) W or WO Contrast  Result Date: 01/24/2023 CLINICAL DATA:  Pulmonary embolus suspected with high probability. Pain and swelling to the right lower extremity and left hand. Abdominal pain and swelling for 5 days. EXAM: CT ANGIOGRAPHY CHEST WITH CONTRAST TECHNIQUE: Multidetector CT imaging of the chest was performed using the standard protocol during bolus  administration of intravenous contrast. Multiplanar CT image reconstructions and MIPs were obtained to evaluate the vascular anatomy.  RADIATION DOSE REDUCTION: This exam was performed according to the departmental dose-optimization program which includes automated exposure control, adjustment of the mA and/or kV according to patient size and/or use of iterative reconstruction technique. CONTRAST:  38m OMNIPAQUE IOHEXOL 350 MG/ML SOLN COMPARISON:  Chest radiograph 01/23/2023 FINDINGS: Cardiovascular: There is moderately good opacification of the central and proximal segmental pulmonary arteries. Distal segmental and subsegmental vessels are poorly visualized due to somewhat weak contrast bolus. Visualized pulmonary arteries are patent without evidence of significant central pulmonary embolus. Diffuse cardiac enlargement with reflux of contrast material into the hepatic veins suggesting right heart failure. No pericardial effusion. Normal caliber thoracic aorta. Mediastinum/Nodes: Esophagus is decompressed. No significant lymphadenopathy. Thyroid gland is unremarkable. Lungs/Pleura: Motion artifact. Probable linear atelectasis in the lung bases. No edema or consolidation suggested. No pleural effusions. No pneumothorax. Upper Abdomen: Diffuse liver enlargement. Small amount of ascites around the liver edge. Musculoskeletal: Degenerative changes in the spine. Review of the MIP images confirms the above findings. IMPRESSION: 1. No evidence of significant central pulmonary embolus. 2. Cardiac enlargement with evidence of right heart failure. 3. Lungs are clear. 4. Liver is diffusely enlarged.  Small amount of abdominal ascites. Electronically Signed   By: WLucienne CapersM.D.   On: 01/24/2023 16:51   DG Foot 2 Views Right  Result Date: 01/24/2023 CLINICAL DATA:  57year old male with right great toe pain. EXAM: RIGHT FOOT - 2 VIEW COMPARISON:  Right foot series 01/08/2023. FINDINGS: Generalized soft tissue swelling not significantly changed from earlier this month. Underlying bone mineralization is within normal limits. Calcaneus appears intact  with degenerative spurring. Joint spaces and alignment are maintained. No acute fracture, dislocation, or osteolysis identified. No soft tissue gas or radiopaque foreign body identified. IMPRESSION: Generalized soft tissue swelling not significantly changed from earlier this month. No acute osseous abnormality identified about the right foot. Electronically Signed   By: HGenevie AnnM.D.   On: 01/24/2023 05:24   DG Hand 2 View Left  Result Date: 01/23/2023 CLINICAL DATA:  Second and third digit swelling EXAM: LEFT HAND - 2 VIEW COMPARISON:  None Available. FINDINGS: Frontal and lateral views of the left hand are obtained. No fracture, subluxation, or dislocation. Mild joint space narrowing within the metacarpophalangeal and interphalangeal joints. There is dorsal soft tissue swelling throughout the hand. IMPRESSION: 1. Mild osteoarthritis. 2. Dorsal soft tissue swelling. 3. No acute fracture. Electronically Signed   By: MRanda NgoM.D.   On: 01/23/2023 21:25   DG Chest 2 View  Result Date: 01/23/2023 CLINICAL DATA:  Left chest pain, cough, short of breath, nausea EXAM: CHEST - 2 VIEW COMPARISON:  10/02/2022 FINDINGS: Frontal and lateral views of the chest demonstrates stable enlargement of the cardiac silhouette. Chronic central vascular congestion without airspace disease, effusion, or pneumothorax. No acute bony abnormalities. IMPRESSION: 1. Stable enlarged cardiac silhouette and central vascular congestion. 2. No acute airspace disease. Electronically Signed   By: MRanda NgoM.D.   On: 01/23/2023 18:01      Discharge time spent: greater than 30 minutes.  Signed: JKarmen Bongo MD [facility] 01/30/2023

## 2023-01-25 NOTE — Progress Notes (Signed)
   01/24/23 2350  Assess: MEWS Score  Temp 98.1 F (36.7 C)  BP (!) 136/104  MAP (mmHg) 113  ECG Heart Rate (!) 115  Resp 19  Assess: MEWS Score  MEWS Temp 0  MEWS Systolic 0  MEWS Pulse 2  MEWS RR 0  MEWS LOC 0  MEWS Score 2  MEWS Score Color Yellow  Assess: if the MEWS score is Yellow or Red  Were vital signs taken at a resting state? No  Focused Assessment No change from prior assessment  Does the patient meet 2 or more of the SIRS criteria? No  MEWS guidelines implemented  No, vital signs rechecked  Assess: SIRS CRITERIA  SIRS Temperature  0  SIRS Pulse 1  SIRS Respirations  0  SIRS WBC 0  SIRS Score Sum  1

## 2023-01-25 NOTE — Assessment & Plan Note (Signed)
-  A1c is 6.1 -Diet controlled

## 2023-02-13 NOTE — Progress Notes (Signed)
HEART & VASCULAR TRANSITION OF CARE CONSULT NOTE     Referring Physician: Dr. Lorin Mercy Primary Care: Primary Cardiologist:  HPI: Referred to clinic by Dr. Lorin Mercy for heart failure consultation.   57 y.o. male with past medical history of HFrEF, DM2, atrial fibrillation, obesity and hypothyroidism.  He has been followed by [facility] cardiology in th past. Admitted to [facility] 9/18 with paroxysmal atrial fibrillation. Echo showed EF 50-55%, normal RV systolic function, mildly dilated LA and RA. S/p TEE found a moderate size thrombus in the LAA, no DCCV.  He was started on Xarelto. He was seen in follow up to discuss repeating TEE/DCCV, but patient was in NSR.   Admitted 4/19 at [facility] for AFL 4/19. TEE showed no thrombus in LAA, and he underwent DCCV to normal sinus rhythm. He was started on flecainide. Echo showed EF 62%. Exercise stress echo was negative for inducible ischemia at target HR. Stress ECG was also negative for inducible ischemia at target heart rate.    He was seen by [facility] Cardiology 2/21. Echo showed EF >65%, findings consistent with hypertrophic obstructive cardiomyopathy with subtle chordal SAM with LVOT gradient of 84mmHg. He had a confirmed diagnosis of sleep apnea but refused CPAP.   Follow up with [facility] cardiology on 7/22 and complained of symptoms of CHF. Echo showed EF 55%, mild LVH, RV free wall was hypokinetic, mild MVR and TVR. Nuclear stress testing was abnormal with moderate inducible ischemia in the apical-mid inferior lateral wall segments. His flecanide was stopped, and he started amiodarone in 7/22. Underwent LHC on 8/22 that showed mild plaque in mid LAD, normal ramus, normal left circumflex, normal RCA. There was slightly sluggish flow, suspicious for microvascular dysfunction.    Echo at [facility] on 8/23 and showed EF 45-50%, mild LVH, diastolic septal flattening consistent with RV volume overload.    Admitted 2/24 with Afib RVR and a/c HF.  Echo showed EF 55%, mild LVH,  severe RV enlargement with moderate RV systolic dysfunction. He was diuresed with IV lasix. He was continued on amiodarone, but adamant to go home despite being volume overloaded. GDMT limited by AKI. Noncompliance major barrier. Discharged weight 295 lbs.  Today he presents to [facility] for post hospital follow up. Overall feeling ok. He has SOB  walking further distances on flat ground, he attributes this to rushing to get to his appointment. He was able to lift dumb bell weights a few days ago for about 15 minutes without significant dyspnea. Feels mild palpitations. Has LE swelling and orthopnea. Denies abnormal bleeding, CP, dizziness, or PND. Appetite ok but he has early satiety. No fever or chills. Weight at home 285 pounds. Missed a dose of Eliquis last week, not taking spironolactone. Unemployed, lives with wife, they have 4 grown children. No tobacco use, ETOH 2x/week, no drugs. Does not wear CPAP.     Cardiac Testing  - Echo (2/24): EF 55%, mild LVH, severe RV enlargement with moderate RV systolic dysfunction.  - Echo at [facility] (8/23): EF 45-50%, mild LVH  - LHC (7/22):mild plaque in mid LAD, normal ramus, normal left circumflex, normal RCA. There was slightly sluggish flow, suspicious for microvascular dysfunction.   - Nuc stress test (2022):  abnormal with moderate inducible ischemia in the apical-mid inferior lateral wall segments.   - Echo (7/22): EF 55%, mild LVH, RV free wall was hypokinetic, mild MVR and TVR  - Echo (2/21): Echo EF > 65%, findings consistent with hypertrophic obstructive cardiomyopathy with subtle chordal SAM  with LVOT gradient of 3mmHg.  - Stress echo (2019) negative for inducible ischemia  - Echo (4/19): EF 62%  - TEE (4/19): no thrombus, s/p DCCV AFL --> NSR  - Echo (9/18):  EF 50-55%, normal RV systolic function, mildly dilated LA and RA.  Review of Systems: [y] = yes, [ ]  = no   General: Weight gain Blue.Reese ]; Weight loss [ ] ; Anorexia [ ] ; Fatigue [ ] ; Fever [  ]; Chills [ ] ; Weakness [ ]   Cardiac: Chest pain/pressure [ ] ; Resting SOB [ ] ; Exertional SOB Blue.Reese ]; Orthopnea Blue.Reese ]; Pedal Edema Blue.Reese ]; Palpitations Blue.Reese ]; Syncope [ ] ; Presyncope [ ] ; Paroxysmal nocturnal dyspnea[ ]   Pulmonary: Cough [ ] ; Wheezing[ ] ; Hemoptysis[ ] ; Sputum [ ] ; Snoring [ ]   GI: Vomiting[ ] ; Dysphagia[ ] ; Melena[ ] ; Hematochezia [ ] ; Heartburn[ ] ; Abdominal pain [ ] ; Constipation [ ] ; Diarrhea [ ] ; BRBPR [ ]   GU: Hematuria[ ] ; Dysuria [ ] ; Nocturia[ ]   Vascular: Pain in legs with walking [ ] ; Pain in feet with lying flat [ ] ; Non-healing sores [ ] ; Stroke Blue.Reese ]; TIA [ ] ; Slurred speech [ ] ;  Neuro: Headaches[ ] ; Vertigo[ ] ; Seizures[ ] ; Paresthesias[ ] ;Blurred vision [ ] ; Diplopia [ ] ; Vision changes [ ]   Ortho/Skin: Arthritis [ ] ; Joint pain [ ] ; Muscle pain [ ] ; Joint swelling [ ] ; Back Pain [ ] ; Rash [ ]   Psych: Depression[ ] ; Anxiety[ ]   Heme: Bleeding problems [ ] ; Clotting disorders [ ] ; Anemia [ ]   Endocrine: Diabetes Blue.Reese ]; Thyroid dysfunction[ ]   Past Medical History:  Diagnosis Date   (HFpEF) heart failure with preserved ejection fraction (HCC)    Arthritis    knees. elbow, ankle   Diabetes mellitus without complication (HCC)    Dyspnea    when in A-fib   Dysrhythmia    A-fib   GERD (gastroesophageal reflux disease)    takes Dexilant prn   Hypertension    takes Lisinopril-HCTZ daily   Muscle spasm    takes Relafen daily   Peripheral edema    Plantar fasciitis    right   Current Outpatient Medications  Medication Sig Dispense Refill   acetaminophen (TYLENOL) 500 MG tablet Take 500-1,000 mg by mouth every 8 (eight) hours as needed for moderate pain.     albuterol (VENTOLIN HFA) 108 (90 Base) MCG/ACT inhaler Inhale 2 puffs into the lungs every 6 (six) hours as needed.     amiodarone (PACERONE) 200 MG tablet Take 1 tablet (200 mg total) by mouth 2 (two) times daily. 60 tablet 1   colchicine 0.6 MG tablet Take 1 tablet (0.6 mg total) by mouth daily as needed  (gout flares). 30 tablet 1   ELIQUIS 5 MG TABS tablet Take 5 mg by mouth 2 (two) times daily.     fluticasone (FLONASE) 50 MCG/ACT nasal spray Place 2 sprays into both nostrils daily. 16 g 0   folic acid (FOLVITE) 1 MG tablet Take 1 mg by mouth daily as needed (low iron).     levothyroxine (SYNTHROID) 50 MCG tablet Take 50 mcg by mouth daily.     losartan (COZAAR) 50 MG tablet Take 50 mg by mouth daily.     magnesium oxide (MAG-OX) 400 MG tablet Take 400 mg by mouth daily.     meclizine (ANTIVERT) 25 MG tablet Take 25 mg by mouth 3 (three) times daily as needed for dizziness.     metoprolol succinate (TOPROL-XL) 50 MG 24  hr tablet Take 50 mg by mouth daily.     Multiple Vitamins-Minerals (MULTIVITAMIN WITH MINERALS) tablet Take 1 tablet by mouth daily.     nitroGLYCERIN (NITROSTAT) 0.4 MG SL tablet Place 1 tablet under the tongue every 5 (five) minutes as needed for chest pain.     potassium chloride SA (KLOR-CON M) 20 MEQ tablet Take 2 tablets (40 mEq total) by mouth 2 (two) times daily. 60 tablet 1   torsemide (DEMADEX) 20 MG tablet Take 2 tablets (40 mg total) by mouth 2 (two) times daily. 60 tablet 1   traZODone (DESYREL) 50 MG tablet Take 25 mg by mouth at bedtime as needed for sleep.     rosuvastatin (CRESTOR) 10 MG tablet Take 1 tablet (10 mg total) by mouth daily. (Patient not taking: Reported on 02/14/2023) 30 tablet 1   spironolactone (ALDACTONE) 25 MG tablet Take 0.5 tablets (12.5 mg total) by mouth daily. (Patient not taking: Reported on 02/14/2023) 30 tablet 1   traMADol (ULTRAM) 50 MG tablet Take 1 tablet (50 mg total) by mouth every 6 (six) hours as needed. (Patient not taking: Reported on 02/14/2023) 20 tablet 0   No current facility-administered medications for this encounter.   Allergies  Allergen Reactions   Iodinated Contrast Media Itching   Other Rash    Other reaction(s): Other (See Comments) Neoprene: Rash   Social History   Socioeconomic History   Marital status:  Single    Spouse name: Not on file   Number of children: Not on file   Years of education: Not on file   Highest education level: Not on file  Occupational History   Not on file  Tobacco Use   Smoking status: Never   Smokeless tobacco: Never  Vaping Use   Vaping Use: Never used  Substance and Sexual Activity   Alcohol use: Yes    Alcohol/week: 6.0 standard drinks of alcohol    Types: 6 Standard drinks or equivalent per week    Comment: occ   Drug use: No   Sexual activity: Yes  Other Topics Concern   Not on file  Social History Narrative   Not on file   Social Determinants of Health   Financial Resource Strain: Low Risk  (01/24/2023)   Overall Financial Resource Strain (CARDIA)    Difficulty of Paying Living Expenses: Not very hard  Food Insecurity: No Food Insecurity (01/24/2023)   Hunger Vital Sign    Worried About Running Out of Food in the Last Year: Never true    Ran Out of Food in the Last Year: Never true  Transportation Needs: No Transportation Needs (01/24/2023)   PRAPARE - Hydrologist (Medical): No    Lack of Transportation (Non-Medical): No  Physical Activity: Not on file  Stress: Not on file  Social Connections: Not on file  Intimate Partner Violence: Not At Risk (01/24/2023)   Humiliation, Afraid, Rape, and Kick questionnaire    Fear of Current or Ex-Partner: No    Emotionally Abused: No    Physically Abused: No    Sexually Abused: No   No family history on file.  BP (!) 178/90   Pulse (!) 144   Ht 5\' 11"  (1.803 m)   Wt 134.5 kg (296 lb 9.6 oz)   SpO2 97%   BMI 41.37 kg/m   Wt Readings from Last 3 Encounters:  02/14/23 134.5 kg (296 lb 9.6 oz)  01/25/23 133.9 kg (295 lb 3.2 oz)  03/11/22  130.2 kg (287 lb)   PHYSICAL EXAM: General:  NAD. Walked into clinic, mild conversation dyspnea. HEENT: Normal Neck: Supple. Thick neck, JVP to jaw. Carotids 2+ bilat; no bruits. No lymphadenopathy or thryomegaly appreciated. Cor:  PMI nondisplaced. Tachy irregular rate & rhythm. No rubs, gallops or murmurs. Lungs: Clear Abdomen: Obese, nontender, + distended. No hepatosplenomegaly. No bruits or masses. Good bowel sounds. Extremities: No cyanosis, clubbing, rash, 2-3+ BLE edema to knees; warm  Neuro: Alert & oriented x 3, cranial nerves grossly intact. Moves all 4 extremities w/o difficulty. Affect pleasant.  ECG (personally reviewed): atrial fibrillation with RVR 140's  ReDs: 43%  ASSESSMENT & PLAN: Acute on Chronic Diastolic Heart Failure - Echo (9/18):EF 50-55%, normal RV systolic function, mildly dilated LA and RA. - Echo (4/19): EF 62% - Echo (2/21): EF >65%, findings consistent with hypertrophic obstructive cardiomyopathy with subtle chordal SAM with LVOT gradient of 36 mm Hg - Echo (8/23): EF 45-50%, mild LVH, diastolic septal flattening consistent with RV volume overload.  - Reduced EF in setting of AF with RVR - Echo (2/24): EF 55%, mild LVH, severe RV enlargement with moderate RV systolic dysfunction. - NYHA III-IIIb symptoms, he is markedly volume overloaded today, probably up 20 lbs of fluid. ReDs 43%. Complicated by AF with RVR - He will need to be admitted for IV diuresis +/- inotropic support for RV failure. Likely RHC when diuresed. - With concern for HCM, will need cMRI and [facility] genetic testing (outpatient). - Place Unna boots. - Place PICC and follow CoOx. - Give IV Lasix 120 mg + metolazone 5 mg po x 1. - Start spiro 12.5 mg daily. - Continue losartan 25 mg daily. - CMET, CBC, BNP, lactic acid today. - Will need serial BMETs.  Atrial fibrillation with RVR - on-going since 2019 - TEE (9/18) moderate size thrombus in the LAA, no DCCV.   - TEE (4/19) no LAA thrombus, DCCV --> NSR - Admit 2/24 with AF with RVR  - on amiodarone 200 mg bid, unclear if he has been taking this consistently - Remains in AF with RVR today on ECG, HR 139 - Continue Toprol XL 50 mg daily. ? Switch to lopressor  tartrate - Continue Eliquis 5 mg bid. - Missed Eliquis doses last week, will need TEE before DCCV. - CBC and TSH.  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  - Mild MR - Diurese as above.  OSA - Needs CPAP  DM2 - Controlled. 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 - Continue Eliquis. - Continue statin.  7. Hypothyroid - On Synthroid - Recent TSH ok   NYHA III-IIIb GDMT  Diuretic: torsemide 40 mg bid BB: Toprol XL 50 mg daily Ace/ARB/ARNI: Losartan 25 mg daily MRA: Start spiro 12.5  SGLT2i: Plan to add SGLT2i during hospitalization  Referred to HFSW (PCP, Medications, Transportation, ETOH Abuse, Drug Abuse, Insurance, Financial ): Yes, transportation concerns Refer to Pharmacy: No Refer to Home Health: No Refer to Advanced Heart Failure Clinic: Yes, after discharge.  Refer to General Cardiology: No  Plan to admit to progressive bed for a/c dHF and atrial fibrillation with RVR. Will need PICC, IV diuresis, TEE/DCCV, potentially +/- RHC and inotropic support. Discussed with Dr. Lenn Sink, FNP-BC 02/14/23

## 2023-02-14 ENCOUNTER — Encounter (HOSPITAL_COMMUNITY): Payer: Self-pay

## 2023-02-14 ENCOUNTER — Inpatient Hospital Stay (HOSPITAL_COMMUNITY)
Admission: RE | Admit: 2023-02-14 | Discharge: 2023-02-19 | DRG: 286 | Disposition: A | Discharge status: Home / Self Care | Payer: PPO | Source: Other Acute Inpatient Hospital | Attending: Cardiology | Admitting: Cardiology

## 2023-02-14 ENCOUNTER — Inpatient Hospital Stay (HOSPITAL_COMMUNITY): Payer: PPO

## 2023-02-14 ENCOUNTER — Inpatient Hospital Stay
Admission: RE | Admit: 2023-02-14 | Discharge: 2023-02-14 | Disposition: A | Discharge status: Home / Self Care | Payer: Self-pay | Source: Ambulatory Visit | Attending: Internal Medicine | Admitting: Internal Medicine

## 2023-02-14 ENCOUNTER — Ambulatory Visit (HOSPITAL_COMMUNITY)
Admission: RE | Admit: 2023-02-14 | Discharge: 2023-02-14 | Disposition: A | Discharge status: Home / Self Care | Payer: PPO | Source: Ambulatory Visit | Attending: Family Medicine | Admitting: Family Medicine

## 2023-02-14 VITALS — BP 146/82 | HR 107 | Ht 71.0 in | Wt 296.6 lb

## 2023-02-14 DIAGNOSIS — N179 Acute kidney failure, unspecified: Secondary | ICD-10-CM | POA: Diagnosis not present

## 2023-02-14 DIAGNOSIS — I5033 Acute on chronic diastolic (congestive) heart failure: Secondary | ICD-10-CM | POA: Insufficient documentation

## 2023-02-14 DIAGNOSIS — E876 Hypokalemia: Secondary | ICD-10-CM | POA: Diagnosis not present

## 2023-02-14 DIAGNOSIS — I081 Rheumatic disorders of both mitral and tricuspid valves: Secondary | ICD-10-CM | POA: Diagnosis not present

## 2023-02-14 DIAGNOSIS — I361 Nonrheumatic tricuspid (valve) insufficiency: Secondary | ICD-10-CM | POA: Diagnosis not present

## 2023-02-14 DIAGNOSIS — Z6841 Body Mass Index (BMI) 40.0 and over, adult: Secondary | ICD-10-CM | POA: Diagnosis not present

## 2023-02-14 DIAGNOSIS — I272 Pulmonary hypertension, unspecified: Secondary | ICD-10-CM | POA: Diagnosis not present

## 2023-02-14 DIAGNOSIS — I4819 Other persistent atrial fibrillation: Secondary | ICD-10-CM | POA: Diagnosis present

## 2023-02-14 DIAGNOSIS — E119 Type 2 diabetes mellitus without complications: Secondary | ICD-10-CM | POA: Insufficient documentation

## 2023-02-14 DIAGNOSIS — Z79890 Hormone replacement therapy (postmenopausal): Secondary | ICD-10-CM

## 2023-02-14 DIAGNOSIS — G4733 Obstructive sleep apnea (adult) (pediatric): Secondary | ICD-10-CM | POA: Insufficient documentation

## 2023-02-14 DIAGNOSIS — Z79899 Other long term (current) drug therapy: Secondary | ICD-10-CM | POA: Insufficient documentation

## 2023-02-14 DIAGNOSIS — Z91199 Patient's noncompliance with other medical treatment and regimen due to unspecified reason: Secondary | ICD-10-CM | POA: Diagnosis not present

## 2023-02-14 DIAGNOSIS — I480 Paroxysmal atrial fibrillation: Secondary | ICD-10-CM | POA: Insufficient documentation

## 2023-02-14 DIAGNOSIS — I421 Obstructive hypertrophic cardiomyopathy: Secondary | ICD-10-CM | POA: Diagnosis not present

## 2023-02-14 DIAGNOSIS — M109 Gout, unspecified: Secondary | ICD-10-CM | POA: Diagnosis present

## 2023-02-14 DIAGNOSIS — I340 Nonrheumatic mitral (valve) insufficiency: Secondary | ICD-10-CM | POA: Diagnosis not present

## 2023-02-14 DIAGNOSIS — I5032 Chronic diastolic (congestive) heart failure: Secondary | ICD-10-CM

## 2023-02-14 DIAGNOSIS — I639 Cerebral infarction, unspecified: Secondary | ICD-10-CM

## 2023-02-14 DIAGNOSIS — Z7901 Long term (current) use of anticoagulants: Secondary | ICD-10-CM | POA: Insufficient documentation

## 2023-02-14 DIAGNOSIS — I4891 Unspecified atrial fibrillation: Secondary | ICD-10-CM | POA: Insufficient documentation

## 2023-02-14 DIAGNOSIS — K219 Gastro-esophageal reflux disease without esophagitis: Secondary | ICD-10-CM | POA: Diagnosis present

## 2023-02-14 DIAGNOSIS — I5030 Unspecified diastolic (congestive) heart failure: Secondary | ICD-10-CM

## 2023-02-14 DIAGNOSIS — I110 Hypertensive heart disease with heart failure: Principal | ICD-10-CM | POA: Diagnosis present

## 2023-02-14 DIAGNOSIS — Z91041 Radiographic dye allergy status: Secondary | ICD-10-CM | POA: Diagnosis not present

## 2023-02-14 DIAGNOSIS — I071 Rheumatic tricuspid insufficiency: Secondary | ICD-10-CM

## 2023-02-14 DIAGNOSIS — Z8673 Personal history of transient ischemic attack (TIA), and cerebral infarction without residual deficits: Secondary | ICD-10-CM

## 2023-02-14 DIAGNOSIS — E039 Hypothyroidism, unspecified: Secondary | ICD-10-CM | POA: Insufficient documentation

## 2023-02-14 DIAGNOSIS — E662 Morbid (severe) obesity with alveolar hypoventilation: Secondary | ICD-10-CM | POA: Diagnosis present

## 2023-02-14 DIAGNOSIS — Z7984 Long term (current) use of oral hypoglycemic drugs: Secondary | ICD-10-CM | POA: Diagnosis not present

## 2023-02-14 DIAGNOSIS — I50811 Acute right heart failure: Secondary | ICD-10-CM

## 2023-02-14 DIAGNOSIS — I509 Heart failure, unspecified: Secondary | ICD-10-CM | POA: Diagnosis not present

## 2023-02-14 DIAGNOSIS — I50813 Acute on chronic right heart failure: Secondary | ICD-10-CM

## 2023-02-14 HISTORY — DX: Unspecified diastolic (congestive) heart failure: I50.30

## 2023-02-14 LAB — COMPREHENSIVE METABOLIC PANEL
ALT: 15 U/L (ref 0–44)
AST: 31 U/L (ref 15–41)
Albumin: 3.5 g/dL (ref 3.5–5.0)
Alkaline Phosphatase: 54 U/L (ref 38–126)
Anion gap: 10 (ref 5–15)
BUN: 7 mg/dL (ref 6–20)
CO2: 29 mmol/L (ref 22–32)
Calcium: 8.7 mg/dL — ABNORMAL LOW (ref 8.9–10.3)
Chloride: 100 mmol/L (ref 98–111)
Creatinine, Ser: 0.83 mg/dL (ref 0.61–1.24)
GFR, Estimated: >60 mL/min (ref >60)
Glucose, Bld: 113 mg/dL — ABNORMAL HIGH (ref 70–99)
Potassium: 3.3 mmol/L — ABNORMAL LOW (ref 3.5–5.1)
Sodium: 139 mmol/L (ref 135–145)
Total Bilirubin: 1.1 mg/dL (ref 0.3–1.2)
Total Protein: 7.2 g/dL (ref 6.5–8.1)

## 2023-02-14 LAB — CBC
HCT: 38.3 % — ABNORMAL LOW (ref 39.0–52.0)
Hemoglobin: 12.2 g/dL — ABNORMAL LOW (ref 13.0–17.0)
MCH: 21.4 pg — ABNORMAL LOW (ref 26.0–34.0)
MCHC: 31.9 g/dL (ref 30.0–36.0)
MCV: 67.3 fL — ABNORMAL LOW (ref 80.0–100.0)
Platelets: 199 10*3/uL (ref 150–400)
RBC: 5.69 MIL/uL (ref 4.22–5.81)
RDW: 20.5 % — ABNORMAL HIGH (ref 11.5–15.5)
WBC: 5 10*3/uL (ref 4.0–10.5)
nRBC: 0 % (ref 0.0–0.2)

## 2023-02-14 LAB — LACTIC ACID, PLASMA: Lactic Acid, Venous: 1.6 mmol/L (ref 0.5–1.9)

## 2023-02-14 LAB — TSH: TSH: 3.423 u[IU]/mL (ref 0.350–4.500)

## 2023-02-14 LAB — MRSA NEXT GEN BY PCR, NASAL: MRSA by PCR Next Gen: DETECTED — AB

## 2023-02-14 LAB — BRAIN NATRIURETIC PEPTIDE: B Natriuretic Peptide: 248.5 pg/mL — ABNORMAL HIGH (ref 0.0–100.0)

## 2023-02-14 MED ORDER — SODIUM CHLORIDE 0.9% FLUSH: 10.0000 mL | INTRAVENOUS | Status: DC | PRN

## 2023-02-14 MED ORDER — CHLORHEXIDINE GLUCONATE CLOTH 2 % EX PADS
6.0000 | MEDICATED_PAD | Freq: Every day | CUTANEOUS | Status: DC
  Administered 2023-02-14 – 2023-02-19 (×6): 6 via TOPICAL

## 2023-02-14 MED ORDER — POTASSIUM CHLORIDE CRYS ER 20 MEQ PO TBCR
40.0000 meq | EXTENDED_RELEASE_TABLET | Freq: Once | ORAL | Status: AC
  Administered 2023-02-14: 40 meq via ORAL
  Filled 2023-02-14: qty 2

## 2023-02-14 MED ORDER — LOSARTAN POTASSIUM 50 MG PO TABS: 50.0000 mg | ORAL_TABLET | Freq: Every day | ORAL | Status: DC

## 2023-02-14 MED ORDER — FUROSEMIDE 10 MG/ML IJ SOLN: 80.0000 mg | Freq: Two times a day (BID) | INTRAMUSCULAR | Status: DC

## 2023-02-14 MED ORDER — FLUTICASONE PROPIONATE 50 MCG/ACT NA SUSP
2.0000 | Freq: Every day | NASAL | Status: DC
  Administered 2023-02-17 – 2023-02-18 (×2): 2 via NASAL
  Filled 2023-02-14: qty 16

## 2023-02-14 MED ORDER — METOLAZONE 2.5 MG PO TABS
5.0000 mg | ORAL_TABLET | Freq: Once | ORAL | Status: DC
  Filled 2023-02-14: qty 2

## 2023-02-14 MED ORDER — AMIODARONE HCL 200 MG PO TABS
200.0000 mg | ORAL_TABLET | Freq: Two times a day (BID) | ORAL | Status: DC
  Administered 2023-02-14 – 2023-02-15 (×2): 200 mg via ORAL
  Filled 2023-02-14 (×2): qty 1

## 2023-02-14 MED ORDER — ALBUTEROL SULFATE (2.5 MG/3ML) 0.083% IN NEBU: 3.0000 mL | INHALATION_SOLUTION | Freq: Four times a day (QID) | RESPIRATORY_TRACT | Status: DC | PRN

## 2023-02-14 MED ORDER — METOPROLOL SUCCINATE ER 50 MG PO TB24
50.0000 mg | ORAL_TABLET | Freq: Every day | ORAL | Status: DC
  Administered 2023-02-15 – 2023-02-19 (×5): 50 mg via ORAL
  Filled 2023-02-14 (×6): qty 1

## 2023-02-14 MED ORDER — SACUBITRIL-VALSARTAN 24-26 MG PO TABS
1.0000 | ORAL_TABLET | Freq: Two times a day (BID) | ORAL | Status: DC
  Administered 2023-02-14 – 2023-02-19 (×10): 1 via ORAL
  Filled 2023-02-14 (×10): qty 1

## 2023-02-14 MED ORDER — APIXABAN 5 MG PO TABS
5.0000 mg | ORAL_TABLET | Freq: Two times a day (BID) | ORAL | Status: DC
  Administered 2023-02-14 – 2023-02-19 (×10): 5 mg via ORAL
  Filled 2023-02-14 (×10): qty 1

## 2023-02-14 MED ORDER — SODIUM CHLORIDE 0.9% FLUSH
10.0000 mL | Freq: Two times a day (BID) | INTRAVENOUS | Status: DC
  Administered 2023-02-14 – 2023-02-18 (×8): 10 mL

## 2023-02-14 MED ORDER — SPIRONOLACTONE 12.5 MG HALF TABLET
12.5000 mg | ORAL_TABLET | Freq: Every day | ORAL | Status: DC
  Administered 2023-02-14 – 2023-02-15 (×2): 12.5 mg via ORAL
  Filled 2023-02-14 (×2): qty 1

## 2023-02-14 MED ORDER — LEVOTHYROXINE SODIUM 50 MCG PO TABS
50.0000 ug | ORAL_TABLET | Freq: Every day | ORAL | Status: DC
  Administered 2023-02-15 – 2023-02-19 (×5): 50 ug via ORAL
  Filled 2023-02-14 (×5): qty 1

## 2023-02-14 MED ORDER — METOLAZONE 2.5 MG PO TABS
2.5000 mg | ORAL_TABLET | Freq: Once | ORAL | Status: AC
  Administered 2023-02-14: 2.5 mg via ORAL
  Filled 2023-02-14: qty 1

## 2023-02-14 MED ORDER — TRAZODONE HCL 50 MG PO TABS
25.0000 mg | ORAL_TABLET | Freq: Every evening | ORAL | Status: DC | PRN
  Administered 2023-02-15 – 2023-02-18 (×5): 25 mg via ORAL
  Filled 2023-02-14 (×5): qty 1

## 2023-02-14 MED ORDER — ORAL CARE MOUTH RINSE: 15.0000 mL | OROMUCOSAL | Status: DC | PRN

## 2023-02-14 MED ORDER — FUROSEMIDE 10 MG/ML IJ SOLN
80.0000 mg | Freq: Two times a day (BID) | INTRAMUSCULAR | Status: AC
  Administered 2023-02-14 – 2023-02-18 (×8): 80 mg via INTRAVENOUS
  Filled 2023-02-14 (×8): qty 8

## 2023-02-14 MED ORDER — ROSUVASTATIN CALCIUM 5 MG PO TABS
10.0000 mg | ORAL_TABLET | Freq: Every day | ORAL | Status: DC
  Administered 2023-02-14 – 2023-02-19 (×6): 10 mg via ORAL
  Filled 2023-02-14 (×6): qty 2

## 2023-02-14 NOTE — Progress Notes (Addendum)
Notified  primary RN, Kandra Nicolas. the PICC line placed is in proper position and is safe for use per CXR/radiology notes.

## 2023-02-14 NOTE — Addendum Note (Signed)
Encounter addended by: Earnie Larsson, NP on: 02/14/2023 1:50 PM  Actions taken: Vitals modified

## 2023-02-14 NOTE — TOC Initial Note (Signed)
Transition of Care [facility]) - Initial/Assessment Note    Patient Details  Name: Parker [name] MRN: [id number] Date of Birth: [birth year]  [facility]) CM/SW Contact:    Erenest Rasher, RN Phone Number: [phone] 02/14/2023, 3:37 PM  Clinical Narrative:                  CM spoke to pt and wife at home to assist with care. Pt states he has scale at home for daily weights. Will continue to follow for dc needs.     Expected Discharge Plan: Home/Self Care Barriers to Discharge: Continued Medical Work up   Patient Goals and CMS Choice            Expected Discharge Plan and Services   Discharge Planning Services: CM Consult   Living arrangements for the past 2 months: Single Family Home                                      Prior Living Arrangements/Services Living arrangements for the past 2 months: Single Family Home Lives with:: Spouse Patient language and need for interpreter reviewed:: Yes Do you feel safe going back to the place where you live?: Yes      Need for Family Participation in Patient Care: No (Comment)   Current home services: DME (CPAP, scale, RW) Criminal Activity/Legal Involvement Pertinent to Current Situation/Hospitalization: No - Comment as needed  Activities of Daily Living      Permission Sought/Granted Permission sought to share information with : Case Manager, Family Supports Permission granted to share information with : Yes, Verbal Permission Granted  Share Information with NAME: Sergei Bortner     Permission granted to share info w Relationship: wife  Permission granted to share info w Contact Information: [phone]  Emotional Assessment Appearance:: Appears stated age Attitude/Demeanor/Rapport: Engaged Affect (typically observed): Accepting Orientation: : Oriented to Self, Oriented to Place, Oriented to  Time, Oriented to Situation   Psych Involvement: No (comment)  Admission diagnosis:  Diastolic  CHF ([place]) 123456 Patient Active Problem List   Diagnosis Date Noted   Diastolic CHF ([place]) XX123456   Obesity, Class III, BMI 40-49.9 (morbid obesity) ([place]) 01/25/2023   Acute on chronic right-sided congestive heart failure ([place]) 01/23/2023   AKI (acute kidney injury) ([place]) 01/23/2023   Controlled type 2 diabetes mellitus without complication, without long-term current use of insulin ([place]) 01/23/2023   Atrial fibrillation with RVR ([place]) 01/23/2023   HTN (hypertension) 01/23/2023   Hypothyroidism 01/23/2023   Gout 01/23/2023   Plantar fasciitis of right foot 04/06/2013   Deformity of metatarsal 04/06/2013   Acquired equinus deformity of foot 04/06/2013   PCP:  Pcp, No Pharmacy:   [id number] - [place], [place] [address] Phone: [phone] Fax: [phone]  [facility] [address] [address] Phone: [phone] Fax: [phone]     Social Determinants of Health (SDOH) Social History: SDOH Screenings   Food Insecurity: No Food Insecurity (01/24/2023)  Housing: Low Risk  (01/24/2023)  Transportation Needs: No Transportation Needs (01/24/2023)  Utilities: Not At Risk (01/24/2023)  Financial Resource Strain: Low Risk  (01/24/2023)  Tobacco Use: Low Risk  (02/14/2023)   SDOH Interventions:     Readmission Risk Interventions     No data to display

## 2023-02-14 NOTE — Progress Notes (Signed)
Orthopedic Tech Progress Note Patient Details:  Parker [name] 26-Oct-1966 [id number]  Ortho Devices Type of Ortho Device: Haematologist Ortho Device/Splint Location: BLE Ortho Device/Splint Interventions: Ordered, Application, Adjustment   Post Interventions Patient Tolerated: Well Instructions Provided: Care of device  [name] 02/14/2023, 5:34 PM

## 2023-02-14 NOTE — Addendum Note (Signed)
Encounter addended by: Payton Mccallum, RN on: 02/14/2023 2:47 PM  Actions taken: Clinical Note Signed

## 2023-02-14 NOTE — Progress Notes (Signed)
Pt transferred to 2 Central nursing unit by clinic RN via wheelchair, Report called to [place] [phone]

## 2023-02-14 NOTE — Progress Notes (Signed)
Peripherally Inserted Central Catheter Placement  The IV Nurse has discussed with the patient and/or persons authorized to consent for the patient, the purpose of this procedure and the potential benefits and risks involved with this procedure.  The benefits include less needle sticks, lab draws from the catheter, and the patient may be discharged home with the catheter. Risks include, but not limited to, infection, bleeding, blood clot (thrombus formation), and puncture of an artery; nerve damage and irregular heartbeat and possibility to perform a PICC exchange if needed/ordered by physician.  Alternatives to this procedure were also discussed.  Bard Power PICC patient education guide, fact sheet on infection prevention and patient information card has been provided to patient /or left at bedside.    PICC Placement Documentation  PICC Triple Lumen [id number] Right Basilic 49 cm 0 cm (Active)  Indication for Insertion or Continuance of Line Vasoactive infusions 02/14/23 1819  Exposed Catheter (cm) 0 cm 02/14/23 1819  Site Assessment Clean, Dry, Intact 02/14/23 1819  Lumen #1 Status Flushed;Saline locked;Blood return noted 02/14/23 1819  Lumen #2 Status Flushed;Saline locked;Blood return noted 02/14/23 1819  Lumen #3 Status Flushed;Saline locked;Blood return noted 02/14/23 1819  Dressing Type Transparent;Securing device 02/14/23 1819  Dressing Status Antimicrobial disc in place 02/14/23 1819  Dressing Intervention New dressing;Other (Comment) 02/14/23 1819  Dressing Change Due 02/21/23 02/14/23 1819       Synthia Innocent 02/14/2023, 6:21 PM

## 2023-02-14 NOTE — Progress Notes (Signed)
H&V Care Navigation CSW Progress Note  Clinical Social Worker consulted to help with transportation concerns preventing pt being admitted to the hospital which is medically necessary at this time.  CSW able to assist with uber ride- waiver signed.   SDOH Screenings   Food Insecurity: No Food Insecurity (01/24/2023)  Housing: Low Risk  (01/24/2023)  Transportation Needs: No Transportation Needs (01/24/2023)  Utilities: Not At Risk (01/24/2023)  Financial Resource Strain: Low Risk  (01/24/2023)  Tobacco Use: Low Risk  (01/23/2023)   Parker [name], [facility] Desk#: [phone] Cell#: [phone]

## 2023-02-14 NOTE — H&P (Cosign Needed Addendum)
Advanced Heart Failure Team History and Physical Note   PCP:  Pcp, No  PCP-Cardiology: None     Reason for Admission:    HPI:   [name] is a 57 y.o. male with HFrEF, DM2, atrial fibrillation, obesity and hypothyroidism. Typically follows with [name]. Has had multiple admissions in the past for PAF and CHF. Has been cardioverted in the past.   [name] was recently discharged from [facility] 2/24 with a fib RVR and a/c HFpEF. Diuresed well during admission however refused to stay longer and still had volume on him at d/c. Was referred to AHF TOC.   At [facility] appointment today he was overall feeling ok. However he was markedly volume overloaded and found to be in a fib RVR. Takes his meds daily, has recently missed eliquis dose. SOB with exertion, none at rest. Able to get around for the most part with no issues. He primary eats vegetables from his garden, trying to cut out sodium and protein. Unable to lay flat. Has noticed weight gain at home. Tests so far significant for BNP 248, SCr .83, K 3.3, EKG: a fib RVR.   Seen while in clinic, stable sitting in chair. Denies CP/SOB.   Cardiac studies reviewed: Echo (9/18):EF 50-55%, normal RV systolic function, mildly dilated LA and RA. Echo (4/19): EF 62% TEE (4/19): no thrombus, s/p DCCV AFL --> NSR  Stress echo (2019) negative for inducible ischemia  Echo (2/21): EF >65%, findings consistent with hypertrophic obstructive cardiomyopathy with subtle chordal SAM with LVOT gradient of 36 mm Hg Nuc stress test (2022):  abnormal with moderate inducible ischemia in the apical-mid inferior lateral wall segments.  LHC (7/22):mild plaque in mid LAD, normal ramus, normal left circumflex, normal RCA. There was slightly sluggish flow, suspicious for microvascular dysfunction.   Echo (8/23): EF 45-50%, mild LVH, diastolic septal flattening consistent with RV volume overload. Reduced EF in setting of AF with RVR Echo (2/24): EF 55%, mild LVH, severe RV  enlargement with moderate RV systolic dysfunction.  Home Medications Prior to Admission medications   Medication Sig Start Date End Date Taking? Authorizing Provider  acetaminophen (TYLENOL) 500 MG tablet Take 500-1,000 mg by mouth every 8 (eight) hours as needed for moderate pain.    [provider]  albuterol (VENTOLIN HFA) 108 (90 Base) MCG/ACT inhaler Inhale 2 puffs into the lungs every 6 (six) hours as needed. 09/18/22   [provider]  amiodarone (PACERONE) 200 MG tablet Take 1 tablet (200 mg total) by mouth 2 (two) times daily. 01/25/23   Karmen Bongo, MD  colchicine 0.6 MG tablet Take 1 tablet (0.6 mg total) by mouth daily as needed (gout flares). 01/25/23 01/25/24  Karmen Bongo, MD  ELIQUIS 5 MG TABS tablet Take 5 mg by mouth 2 (two) times daily. 08/26/22   [provider]  fluticasone (FLONASE) 50 MCG/ACT nasal spray Place 2 sprays into both nostrils daily. 09/16/22   Perlie Mayo, NP  folic acid (FOLVITE) 1 MG tablet Take 1 mg by mouth daily as needed (low iron). 07/07/22   [provider]  levothyroxine (SYNTHROID) 50 MCG tablet Take 50 mcg by mouth daily. 09/11/22   [provider]  losartan (COZAAR) 50 MG tablet Take 50 mg by mouth daily. 08/28/22   [provider]  magnesium oxide (MAG-OX) 400 MG tablet Take 400 mg by mouth daily. 06/07/22   [provider]  meclizine (ANTIVERT) 25 MG tablet Take 25 mg by mouth 3 (three)  times daily as needed for dizziness. 07/07/22   [provider]  metoprolol succinate (TOPROL-XL) 50 MG 24 hr tablet Take 50 mg by mouth daily. 08/16/22   [provider]  Multiple Vitamins-Minerals (MULTIVITAMIN WITH MINERALS) tablet Take 1 tablet by mouth daily.    [provider]  nitroGLYCERIN (NITROSTAT) 0.4 MG SL tablet Place 1 tablet under the tongue every 5 (five) minutes as needed for chest pain. 04/09/21   [provider]  potassium chloride SA (KLOR-CON M) 20  MEQ tablet Take 2 tablets (40 mEq total) by mouth 2 (two) times daily. 01/25/23   Karmen Bongo, MD  rosuvastatin (CRESTOR) 10 MG tablet Take 1 tablet (10 mg total) by mouth daily. Patient not taking: Reported on 02/14/2023 01/25/23   Karmen Bongo, MD  spironolactone (ALDACTONE) 25 MG tablet Take 0.5 tablets (12.5 mg total) by mouth daily. Patient not taking: Reported on 02/14/2023 01/25/23   Karmen Bongo, MD  torsemide (DEMADEX) 20 MG tablet Take 2 tablets (40 mg total) by mouth 2 (two) times daily. 01/25/23   Karmen Bongo, MD  traMADol (ULTRAM) 50 MG tablet Take 1 tablet (50 mg total) by mouth every 6 (six) hours as needed. Patient not taking: Reported on 02/14/2023 01/25/23 01/25/24  Karmen Bongo, MD  traZODone (DESYREL) 50 MG tablet Take 25 mg by mouth at bedtime as needed for sleep. 08/28/22   [provider]    Past Medical History: Past Medical History:  Diagnosis Date   (HFpEF) heart failure with preserved ejection fraction (HCC)    Arthritis    knees. elbow, ankle   Diabetes mellitus without complication (HCC)    Dyspnea    when in A-fib   Dysrhythmia    A-fib   GERD (gastroesophageal reflux disease)    takes Dexilant prn   Hypertension    takes Lisinopril-HCTZ daily   Muscle spasm    takes Relafen daily   Peripheral edema    Plantar fasciitis    right    Past Surgical History: Past Surgical History:  Procedure Laterality Date   EAR CYST EXCISION N/A 08/06/2013   Procedure: CYST REMOVAL;  Surgeon: Ruby Cola, MD;  Location: [name] OR;  Service: ENT;  Laterality: N/A;   ESOPHAGOGASTRODUODENOSCOPY     left knee arthroscopy  2003   PAROTIDECTOMY Right 08/06/2013   Procedure: PAROTIDECTOMY;  Surgeon: Ruby Cola, MD;  Location: [facility];  Service: ENT;  Laterality: Right;   TONSILLECTOMY Bilateral 08/06/2013   Procedure: TONSILLECTOMY;  Surgeon: Ruby Cola, MD;  Location: [facility];  Service: ENT;  Laterality: Bilateral;   Family History:  No family  history on file.  Social History: Social History   Socioeconomic History   Marital status: Single    Spouse name: Not on file   Number of children: Not on file   Years of education: Not on file   Highest education level: Not on file  Occupational History   Not on file  Tobacco Use   Smoking status: Never   Smokeless tobacco: Never  Vaping Use   Vaping Use: Never used  Substance and Sexual Activity   Alcohol use: Yes    Alcohol/week: 6.0 standard drinks of alcohol    Types: 6 Standard drinks or equivalent per week    Comment: occ   Drug use: No   Sexual activity: Yes  Other Topics Concern   Not on file  Social History Narrative   Not on file   Social Determinants of Health   Financial  Resource Strain: Low Risk  (01/24/2023)   Overall Financial Resource Strain (CARDIA)    Difficulty of Paying Living Expenses: Not very hard  Food Insecurity: No Food Insecurity (01/24/2023)   Hunger Vital Sign    Worried About Running Out of Food in the Last Year: Never true    Ran Out of Food in the Last Year: Never true  Transportation Needs: No Transportation Needs (01/24/2023)   PRAPARE - Hydrologist (Medical): No    Lack of Transportation (Non-Medical): No  Physical Activity: Not on file  Stress: Not on file  Social Connections: Not on file   Allergies:  Allergies  Allergen Reactions   Iodinated Contrast Media Itching   Other Rash    Other reaction(s): Other (See Comments) Neoprene: Rash   Objective:    Vital Signs:   BP: ()/()  Arterial Line BP: ()/()    There were no vitals filed for this visit.   Physical Exam     General:  well appearing.  No respiratory difficulty HEENT: normal Neck: supple. thick neck, JVD appears to be to jaw. Carotids 2+ bilat; no bruits. No lymphadenopathy or thyromegaly appreciated. Cor: PMI nondisplaced. Irregular rate & rhythm. No rubs, gallops or murmurs. Lungs: clear Abdomen: soft, nontender, nondistended.  No hepatosplenomegaly. No bruits or masses. Good bowel sounds. Extremities: no cyanosis, clubbing, rash, +2 BLE edema  Neuro: alert & oriented x 3, cranial nerves grossly intact. moves all 4 extremities w/o difficulty. Affect pleasant.   Telemetry   N/a  EKG   A fib RVR 129bpm (Personally reviewed)    Labs  Basic Metabolic Panel: No results for input(s): "NA", "K", "CL", "CO2", "GLUCOSE", "BUN", "CREATININE", "CALCIUM", "MG", "PHOS" in the last 168 hours.  Liver Function Tests: No results for input(s): "AST", "ALT", "ALKPHOS", "BILITOT", "PROT", "ALBUMIN" in the last 168 hours. No results for input(s): "LIPASE", "AMYLASE" in the last 168 hours. No results for input(s): "AMMONIA" in the last 168 hours.  CBC: Recent Labs  Lab 02/14/23 1106  WBC 5.0  HGB 12.2*  HCT 38.3*  MCV 67.3*  PLT 199    Cardiac Enzymes: No results for input(s): "CKTOTAL", "CKMB", "CKMBINDEX", "TROPONINI" in the last 168 hours.  BNP: BNP (last 3 results) Recent Labs    01/23/23 1710  BNP 377.4*    ProBNP (last 3 results) No results for input(s): "PROBNP" in the last 8760 hours.   CBG: No results for input(s): "GLUCAP" in the last 168 hours.  Coagulation Studies: No results for input(s): "LABPROT", "INR" in the last 72 hours.  Imaging: No results found.  Patient Profile  Mr. Ory is a 57 y.o. male with HFrEF, DM2, atrial fibrillation, obesity and hypothyroidism.   Direct admission from clinic for a fib RVR and acute on chronic diastolic heart failure. Assessment/Plan  Acute on Chronic Diastolic Heart Failure - Echo (2/24): EF 55%, mild LVH, severe RV enlargement with moderate RV systolic dysfunction. - Admitted from AHF clinic for volume overload. NYHA III-IIIb symptoms, ReDs 43%. - Start IV Lasix 80 mg BID + 2.5 mg metolazone - Place PICC, trend CVP/ co-ox. May need inotropic support - Start spiro 12.5 mg daily. - Place Unna boots. - Switch losartan to entresto 24-26 mg BID -  With concern for HCM, will need cMRI and [facility] genetic testing. - Strict I&O, daily weights   Atrial fibrillation with RVR - on-going since 2019 - TEE (9/18) moderate size thrombus in the LAA, no DCCV.   -  TEE (4/19) no LAA thrombus, DCCV --> NSR - Admit 2/24 with AF with RVR  - on amiodarone 200 mg bid, unclear if he has been taking this consistently - Remains in AF with RVR today on ECG, HR 139 - hold off on amio gtt for now, rate stable - Continue Toprol XL 50 mg daily - Continue Eliquis 5 mg bid - Missed Eliquis doses last week, TEE/DCCV next week once better diuresed   Valvular Disease - Moderate TR probably functional due to RA and RV dilatation.  - Mild MR   OSA - Needs CPAP   DM2 - Controlled. 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 - Continue Eliquis, statin.   7. Hypothyroid - Continue Synthroid  8. Obesity - There is no height or weight on file to calculate BMI.   9. Hypokalemia - K 3.3 - replete, will probably need more with diuresis - daily BMET   Parker Larsson, NP 02/14/2023, 12:15 PM  Advanced Heart Failure Team Pager [phone] (M-F; 7a - 5p)  Please contact [facility] for night-coverage after hours (4p -7a ) and weekends on [email or web address]

## 2023-02-15 DIAGNOSIS — I5033 Acute on chronic diastolic (congestive) heart failure: Secondary | ICD-10-CM | POA: Diagnosis not present

## 2023-02-15 DIAGNOSIS — I4819 Other persistent atrial fibrillation: Secondary | ICD-10-CM | POA: Diagnosis not present

## 2023-02-15 LAB — COOXEMETRY PANEL
Carboxyhemoglobin: 1.8 % — ABNORMAL HIGH (ref 0.5–1.5)
Methemoglobin: <0.7 % (ref 0.0–1.5)
O2 Saturation: 69.5 %
Total hemoglobin: 13 g/dL (ref 12.0–16.0)

## 2023-02-15 LAB — BASIC METABOLIC PANEL
Anion gap: 12 (ref 5–15)
BUN: 9 mg/dL (ref 6–20)
CO2: 28 mmol/L (ref 22–32)
Calcium: 8.9 mg/dL (ref 8.9–10.3)
Chloride: 98 mmol/L (ref 98–111)
Creatinine, Ser: 0.92 mg/dL (ref 0.61–1.24)
GFR, Estimated: >60 mL/min (ref >60)
Glucose, Bld: 112 mg/dL — ABNORMAL HIGH (ref 70–99)
Potassium: 3.2 mmol/L — ABNORMAL LOW (ref 3.5–5.1)
Sodium: 138 mmol/L (ref 135–145)

## 2023-02-15 LAB — CBC
HCT: 38.1 % — ABNORMAL LOW (ref 39.0–52.0)
Hemoglobin: 12.6 g/dL — ABNORMAL LOW (ref 13.0–17.0)
MCH: 22.1 pg — ABNORMAL LOW (ref 26.0–34.0)
MCHC: 33.1 g/dL (ref 30.0–36.0)
MCV: 66.7 fL — ABNORMAL LOW (ref 80.0–100.0)
Platelets: 180 10*3/uL (ref 150–400)
RBC: 5.71 MIL/uL (ref 4.22–5.81)
RDW: 21 % — ABNORMAL HIGH (ref 11.5–15.5)
WBC: 5.2 10*3/uL (ref 4.0–10.5)
nRBC: 0 % (ref 0.0–0.2)

## 2023-02-15 MED ORDER — AMIODARONE HCL IN DEXTROSE 360-4.14 MG/200ML-% IV SOLN
60.0000 mg/h | INTRAVENOUS | Status: AC
  Administered 2023-02-15 (×2): 60 mg/h via INTRAVENOUS
  Filled 2023-02-15 (×2): qty 200

## 2023-02-15 MED ORDER — AMIODARONE HCL IN DEXTROSE 360-4.14 MG/200ML-% IV SOLN
30.0000 mg/h | INTRAVENOUS | Status: DC
  Administered 2023-02-15 – 2023-02-18 (×6): 30 mg/h via INTRAVENOUS
  Filled 2023-02-15 (×7): qty 200

## 2023-02-15 MED ORDER — METOLAZONE 2.5 MG PO TABS
5.0000 mg | ORAL_TABLET | Freq: Once | ORAL | Status: AC
  Administered 2023-02-15: 5 mg via ORAL
  Filled 2023-02-15: qty 2

## 2023-02-15 MED ORDER — POTASSIUM CHLORIDE CRYS ER 20 MEQ PO TBCR
40.0000 meq | EXTENDED_RELEASE_TABLET | Freq: Once | ORAL | Status: AC
  Administered 2023-02-15: 40 meq via ORAL
  Filled 2023-02-15: qty 2

## 2023-02-15 MED ORDER — SPIRONOLACTONE 12.5 MG HALF TABLET
12.5000 mg | ORAL_TABLET | Freq: Once | ORAL | Status: AC
  Administered 2023-02-15: 12.5 mg via ORAL
  Filled 2023-02-15: qty 1

## 2023-02-15 MED ORDER — SPIRONOLACTONE 25 MG PO TABS
25.0000 mg | ORAL_TABLET | Freq: Every day | ORAL | Status: DC
  Administered 2023-02-16 – 2023-02-19 (×4): 25 mg via ORAL
  Filled 2023-02-15 (×4): qty 1

## 2023-02-15 MED ORDER — POTASSIUM CHLORIDE CRYS ER 20 MEQ PO TBCR
40.0000 meq | EXTENDED_RELEASE_TABLET | ORAL | Status: AC
  Administered 2023-02-15 (×2): 40 meq via ORAL
  Filled 2023-02-15 (×2): qty 2

## 2023-02-15 NOTE — Progress Notes (Signed)
Advanced Heart Failure Rounding Note   Subjective:    Diuresing with IV lasix and metolazone. Weight down 8 pounds overnight  CVP 16 Co-ox 70%  AF remains fast on po amio  K 3.2   Denies CP or SOB.    Objective:   Weight Range:  Vital Signs:   Temp:  [97.5 F (36.4 C)-98.7 F (37.1 C)] 97.7 F (36.5 C) (03/16 0818) Pulse Rate:  [90-129] 90 (03/16 0818) Resp:  [16-20] 17 (03/16 0818) BP: (110-148)/(87-111) 110/87 (03/16 0818) SpO2:  [91 %-97 %] 94 % (03/16 0818) Weight:  [130.6 kg-134 kg] 130.6 kg (03/16 0500) Last BM Date :  (pta)  Weight change: Filed Weights   02/14/23 1430 02/15/23 0500  Weight: 134 kg 130.6 kg    Intake/Output:   Intake/Output Summary (Last 24 hours) at 02/15/2023 1048 Last data filed at 02/15/2023 [id number] Gross per 24 hour  Intake 1195 ml  Output 4250 ml  Net -3055 ml     Physical Exam: General:  Sitting in chair No resp difficulty HEENT: normal Neck: supple. Thick JVP to ear . Carotids 2+ bilat; no bruits. No lymphadenopathy or thryomegaly appreciated. Cor: PMI nondisplaced. Irregular fast. No rubs, gallops or murmurs. Lungs: clear Abdomen: soft, nontender, nondistended. No hepatosplenomegaly. No bruits or masses. Good bowel sounds. Extremities: no cyanosis, clubbing, rash, 2-3+ edema into thighs Neuro: alert & orientedx3, cranial nerves grossly intact. moves all 4 extremities w/o difficulty. Affect pleasant  Telemetry: AF 110-120 Personally reviewed   Labs: Basic Metabolic Panel: Recent Labs  Lab 02/14/23 1106 02/15/23 0514  NA 139 138  K 3.3* 3.2*  CL 100 98  CO2 29 28  GLUCOSE 113* 112*  BUN 7 9  CREATININE 0.83 0.92  CALCIUM 8.7* 8.9    Liver Function Tests: Recent Labs  Lab 02/14/23 1106  AST 31  ALT 15  ALKPHOS 54  BILITOT 1.1  PROT 7.2  ALBUMIN 3.5   No results for input(s): "LIPASE", "AMYLASE" in the last 168 hours. No results for input(s): "AMMONIA" in the last 168 hours.  CBC: Recent Labs  Lab  02/14/23 1106 02/15/23 0514  WBC 5.0 5.2  HGB 12.2* 12.6*  HCT 38.3* 38.1*  MCV 67.3* 66.7*  PLT 199 180    Cardiac Enzymes: No results for input(s): "CKTOTAL", "CKMB", "CKMBINDEX", "TROPONINI" in the last 168 hours.  BNP: BNP (last 3 results) Recent Labs    01/23/23 1710 02/14/23 1106  BNP 377.4* 248.5*    ProBNP (last 3 results) No results for input(s): "PROBNP" in the last 8760 hours.    Other results:  Imaging: DG Chest Port 1 View  Result Date: 02/14/2023 CLINICAL DATA:  PICC line placement EXAM: PORTABLE CHEST 1 VIEW COMPARISON:  01/23/2023 FINDINGS: Single frontal view of the chest demonstrates right-sided PICC tip overlying superior vena cava. Cardiac silhouette is enlarged stable. No airspace disease, effusion, or pneumothorax. No acute bony abnormality. IMPRESSION: 1. Right-sided PICC tip overlying superior vena cava. 2. Stable enlarged cardiac silhouette. Electronically Signed   By: Randa Ngo M.D.   On: 02/14/2023 18:55   [place] EKG SITE RITE  Result Date: 02/14/2023 If Site Rite image not attached, placement could not be confirmed due to current cardiac rhythm.    Medications:     Scheduled Medications:  amiodarone  200 mg Oral BID   apixaban  5 mg Oral BID   Chlorhexidine Gluconate Cloth  6 each Topical Daily   fluticasone  2 spray Each Nare Daily  furosemide  80 mg Intravenous BID   levothyroxine  50 mcg Oral Q0600   metoprolol succinate  50 mg Oral Daily   rosuvastatin  10 mg Oral Daily   sacubitril-valsartan  1 tablet Oral BID   sodium chloride flush  10-40 mL Intracatheter Q12H   spironolactone  12.5 mg Oral Daily    Infusions:   PRN Medications: albuterol, mouth rinse, sodium chloride flush, traZODone   Assessment/Plan:    1. 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 - Admitted from AHF clinic  on 3/15 for volume overload. NYHA III-IIIb symptoms, ReDs 43%. - Diuresing well but still markedly overloaded. CVP 16 Continue IV Lasix 80 mg BID + 2.5 mg metolazone - Increase spiro to 25 mg daily. - Continue entresto 24-26 mg BID - Place Unna boots. - Will need RHC prior to DC-CV to further assess PAH/RV failure - Strict I&O, daily weights   2. Persistent Atrial fibrillation with RVR - on-going since 2019 - TEE (9/18) moderate size thrombus in the LAA, no DCCV.   - TEE (4/19) no LAA thrombus, DCCV --> NSR - Admit 2/24 with AF with RVR  - on amiodarone 200 mg bid, unclear if he has been taking this consistently - Remains in AF with RVR today on po amio - Start IV amio  - Continue Toprol XL 50 mg daily - Continue Eliquis 5 mg bid - Missed Eliquis doses last week, TEE/DCCV next week once better diuresed - Given duration of AF - not clear to me that he will be able to l NSR but given difficulty with rate control and managing HF will attempt with amio support   3. 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  - Mild MR   4.OSA - Needs CPAP   5. DM2 - Controlled. - hgb A1C 6.1 - Plan to start SGLT2i during admission  6. 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 - Continue Eliquis, statin.   7. Obesity - Body mass index is 40.16 kg/m.   8. Hypokalemia - K 3.3 - Supp K  - Increase spiro  19. H/o noncompliance - left AMA last admit     Length of Stay: 1  Glori Bickers MD 02/15/2023, 10:48 AM  Advanced Heart Failure Team Pager [phone] (M-F; [place])  Please contact [facility] for night-coverage after hours (4p -7a ) and weekends on [email or web address]

## 2023-02-15 NOTE — Plan of Care (Signed)

## 2023-02-16 DIAGNOSIS — I5033 Acute on chronic diastolic (congestive) heart failure: Secondary | ICD-10-CM | POA: Diagnosis not present

## 2023-02-16 DIAGNOSIS — I4819 Other persistent atrial fibrillation: Secondary | ICD-10-CM | POA: Diagnosis not present

## 2023-02-16 LAB — IRON AND TIBC
Iron: 37 ug/dL — ABNORMAL LOW (ref 45–182)
Saturation Ratios: 7 % — ABNORMAL LOW (ref 17.9–39.5)
TIBC: 539 ug/dL — ABNORMAL HIGH (ref 250–450)
UIBC: 502 ug/dL

## 2023-02-16 LAB — CBC
HCT: 41.2 % (ref 39.0–52.0)
Hemoglobin: 13.4 g/dL (ref 13.0–17.0)
MCH: 21.9 pg — ABNORMAL LOW (ref 26.0–34.0)
MCHC: 32.5 g/dL (ref 30.0–36.0)
MCV: 67.3 fL — ABNORMAL LOW (ref 80.0–100.0)
Platelets: 158 10*3/uL (ref 150–400)
RBC: 6.12 MIL/uL — ABNORMAL HIGH (ref 4.22–5.81)
RDW: 21.2 % — ABNORMAL HIGH (ref 11.5–15.5)
WBC: 5.1 10*3/uL (ref 4.0–10.5)
nRBC: 0 % (ref 0.0–0.2)

## 2023-02-16 LAB — BASIC METABOLIC PANEL
Anion gap: 14 (ref 5–15)
BUN: 17 mg/dL (ref 6–20)
CO2: 27 mmol/L (ref 22–32)
Calcium: 8.9 mg/dL (ref 8.9–10.3)
Chloride: 94 mmol/L — ABNORMAL LOW (ref 98–111)
Creatinine, Ser: 1.14 mg/dL (ref 0.61–1.24)
GFR, Estimated: >60 mL/min (ref >60)
Glucose, Bld: 120 mg/dL — ABNORMAL HIGH (ref 70–99)
Potassium: 3.6 mmol/L (ref 3.5–5.1)
Sodium: 135 mmol/L (ref 135–145)

## 2023-02-16 LAB — FOLATE: Folate: 12.2 ng/mL (ref >5.9)

## 2023-02-16 LAB — FERRITIN: Ferritin: 24 ng/mL (ref 24–336)

## 2023-02-16 LAB — VITAMIN B12: Vitamin B-12: 295 pg/mL (ref 180–914)

## 2023-02-16 LAB — MAGNESIUM: Magnesium: 1.7 mg/dL (ref 1.7–2.4)

## 2023-02-16 LAB — RETICULOCYTES
Immature Retic Fract: 24 % — ABNORMAL HIGH (ref 2.3–15.9)
RBC.: 6.58 MIL/uL — ABNORMAL HIGH (ref 4.22–5.81)
Retic Count, Absolute: 115.8 10*3/uL (ref 19.0–186.0)
Retic Ct Pct: 1.8 % (ref 0.4–3.1)

## 2023-02-16 LAB — COOXEMETRY PANEL
Carboxyhemoglobin: 2 % — ABNORMAL HIGH (ref 0.5–1.5)
Methemoglobin: <0.7 % (ref 0.0–1.5)
O2 Saturation: 62 %
Total hemoglobin: 13.5 g/dL (ref 12.0–16.0)

## 2023-02-16 MED ORDER — ALTEPLASE 2 MG IJ SOLR
2.0000 mg | Freq: Once | INTRAMUSCULAR | Status: AC
  Administered 2023-02-16: 2 mg
  Filled 2023-02-16: qty 2

## 2023-02-16 MED ORDER — METOLAZONE 2.5 MG PO TABS
5.0000 mg | ORAL_TABLET | Freq: Once | ORAL | Status: AC
  Administered 2023-02-16: 5 mg via ORAL
  Filled 2023-02-16: qty 2

## 2023-02-16 MED ORDER — PREDNISONE 20 MG PO TABS
40.0000 mg | ORAL_TABLET | Freq: Every day | ORAL | Status: AC
  Administered 2023-02-16 – 2023-02-18 (×3): 40 mg via ORAL
  Filled 2023-02-16 (×3): qty 2

## 2023-02-16 MED ORDER — MAGNESIUM SULFATE 2 GM/50ML IV SOLN
2.0000 g | Freq: Once | INTRAVENOUS | Status: AC
  Administered 2023-02-16: 2 g via INTRAVENOUS
  Filled 2023-02-16: qty 50

## 2023-02-16 MED ORDER — POTASSIUM CHLORIDE CRYS ER 20 MEQ PO TBCR
40.0000 meq | EXTENDED_RELEASE_TABLET | ORAL | Status: AC
  Administered 2023-02-16 (×2): 40 meq via ORAL
  Filled 2023-02-16 (×2): qty 2

## 2023-02-16 NOTE — Plan of Care (Signed)

## 2023-02-16 NOTE — Progress Notes (Addendum)
Advanced Heart Failure Rounding Note   Subjective:    Diuresing with IV lasix and metolazone. Weight down another 4 pounds overnight (12 total) CVP 16 Co-ox 62% Renal function stable K 3.6  CVP 16  Now on IV amio. AF rates improved    Denies CP or SOB. Main complaint is pain in left hand. Feels like gout    Objective:   Weight Range:  Vital Signs:   Temp:  [97.6 F (36.4 C)-98.2 F (36.8 C)] 97.7 F (36.5 C) (03/17 0802) Pulse Rate:  [93-129] 100 (03/17 0802) Resp:  [15-20] 18 (03/17 0802) BP: (101-120)/(72-91) 108/72 (03/17 0802) SpO2:  [93 %-98 %] 93 % (03/17 0802) Weight:  [128.7 kg] 128.7 kg (03/17 0500) Last BM Date :  (pta)  Weight change: Filed Weights   02/14/23 1430 02/15/23 0500 02/16/23 0500  Weight: 134 kg 130.6 kg 128.7 kg    Intake/Output:   Intake/Output Summary (Last 24 hours) at 02/16/2023 1040 Last data filed at 02/16/2023 0802 Gross per 24 hour  Intake 842.38 ml  Output 3550 ml  Net -2707.62 ml      Physical Exam: General:  Sitting in chair No resp difficulty Neck: thick supple. Jvp to ear  Carotids 2+ bilat; no bruits. No lymphadenopathy or thryomegaly appreciated. Cor: PMI nondisplaced. Irregular rate & rhythm. No rubs, gallops or murmurs. Lungs: clear Abdomen: obese soft, nontender, nondistended. No hepatosplenomegaly. No bruits or masses. Good bowel sounds. Extremities: no cyanosis, clubbing, rash, 2-3+ edema + UNNA  Neuro: alert & orientedx3, cranial nerves grossly intact. moves all 4 extremities w/o difficulty. Affect pleasant  Telemetry: AF 95-110 Personally reviewed   Labs: Basic Metabolic Panel: Recent Labs  Lab 02/14/23 1106 02/15/23 0514 02/16/23 0525  NA 139 138 135  K 3.3* 3.2* 3.6  CL 100 98 94*  CO2 29 28 27   GLUCOSE 113* 112* 120*  BUN 7 9 17   CREATININE 0.83 0.92 1.14  CALCIUM 8.7* 8.9 8.9  MG  --   --  1.7     Liver Function Tests: Recent Labs  Lab 02/14/23 1106  AST 31  ALT 15  ALKPHOS 54   BILITOT 1.1  PROT 7.2  ALBUMIN 3.5    No results for input(s): "LIPASE", "AMYLASE" in the last 168 hours. No results for input(s): "AMMONIA" in the last 168 hours.  CBC: Recent Labs  Lab 02/14/23 1106 02/15/23 0514 02/16/23 0525  WBC 5.0 5.2 5.1  HGB 12.2* 12.6* 13.4  HCT 38.3* 38.1* 41.2  MCV 67.3* 66.7* 67.3*  PLT 199 180 158     Cardiac Enzymes: No results for input(s): "CKTOTAL", "CKMB", "CKMBINDEX", "TROPONINI" in the last 168 hours.  BNP: BNP (last 3 results) Recent Labs    01/23/23 1710 02/14/23 1106  BNP 377.4* 248.5*     ProBNP (last 3 results) No results for input(s): "PROBNP" in the last 8760 hours.    Other results:  Imaging: DG Chest Port 1 View  Result Date: 02/14/2023 CLINICAL DATA:  PICC line placement EXAM: PORTABLE CHEST 1 VIEW COMPARISON:  01/23/2023 FINDINGS: Single frontal view of the chest demonstrates right-sided PICC tip overlying superior vena cava. Cardiac silhouette is enlarged stable. No airspace disease, effusion, or pneumothorax. No acute bony abnormality. IMPRESSION: 1. Right-sided PICC tip overlying superior vena cava. 2. Stable enlarged cardiac silhouette. Electronically Signed   By: Randa Ngo M.D.   On: 02/14/2023 18:55   [place] EKG SITE RITE  Result Date: 02/14/2023 If [facility] image not attached,  placement could not be confirmed due to current cardiac rhythm.    Medications:     Scheduled Medications:  apixaban  5 mg Oral BID   Chlorhexidine Gluconate Cloth  6 each Topical Daily   fluticasone  2 spray Each Nare Daily   furosemide  80 mg Intravenous BID   levothyroxine  50 mcg Oral Q0600   metoprolol succinate  50 mg Oral Daily   potassium chloride  40 mEq Oral Q4H   rosuvastatin  10 mg Oral Daily   sacubitril-valsartan  1 tablet Oral BID   sodium chloride flush  10-40 mL Intracatheter Q12H   spironolactone  25 mg Oral Daily    Infusions:  amiodarone 30 mg/hr (02/16/23 0537)    PRN Medications: albuterol,  mouth rinse, sodium chloride flush, traZODone   Assessment/Plan:    1. 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 - Admitted from AHF clinic on 3/15 for volume overload. NYHA III-IIIb symptoms, ReDs 43%. - Diuresing well but still overloaded. CVP 16 Continue IV Lasix 80 mg BID + 5 mg metolazone - Continue spiro to 25 mg daily. - Continue entresto 24-26 mg BID - Continue Unna boots. - Will need RHC prior to DC-CV once diuresed to further assess PAH/RV failure. Probably won't ne ready until Tuesday - Strict I&O, daily weights   2. Persistent Atrial fibrillation with RVR - on-going since 2019 - TEE (9/18) moderate size thrombus in the LAA, no DCCV.   - TEE (4/19) no LAA thrombus, DCCV --> NSR - Admit 2/24 with AF with RVR  - on amiodarone 200 mg bid, unclear if he has been taking this consistently - Now on IV amio. Rates better controlled but still elevated - Continue Toprol XL 50 mg daily - Continue Eliquis 5 mg bid - Missed Eliquis doses last week, TEE/DCCV early thisweek once better diuresed - Given duration of AF - not clear that he will be able to maintain NSR but given difficulty with rate control and managing HF will attempt DC-CV with amio support   3. 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  - Mild MR   4. Probable OHS/OSA - Needs sleep eval   5. DM2 - Controlled. - hgb A1C 6.1 - Plan to start SGLT2i during admission  6. 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 - Continue Eliquis, statin.   7. Obesity - Body mass index is 39.57 kg/m.   8. Hypokalemia - K 3.6 - Supp K  - Continue spiro  9. Low MCV - check iron panel  10. H/o noncompliance - left AMA last admit   11. Acute gout in left hand - prednisone  burst    Length of Stay: 2  Glori Bickers MD 02/16/2023, 10:40 AM  Advanced Heart Failure Team Pager [phone] (M-F; 7a - 4p)  Please contact [facility] for night-coverage after hours (4p -7a ) and weekends on [email or web address]

## 2023-02-17 ENCOUNTER — Ambulatory Visit (HOSPITAL_COMMUNITY): Admit: 2023-02-17 | Payer: PPO | Admitting: Internal Medicine

## 2023-02-17 ENCOUNTER — Encounter (HOSPITAL_COMMUNITY)
Admission: RE | Disposition: A | Discharge status: Home / Self Care | Payer: Self-pay | Source: Other Acute Inpatient Hospital | Attending: Cardiology

## 2023-02-17 ENCOUNTER — Other Ambulatory Visit (HOSPITAL_COMMUNITY): Payer: Self-pay

## 2023-02-17 DIAGNOSIS — E876 Hypokalemia: Secondary | ICD-10-CM | POA: Diagnosis not present

## 2023-02-17 DIAGNOSIS — N179 Acute kidney failure, unspecified: Secondary | ICD-10-CM | POA: Diagnosis not present

## 2023-02-17 DIAGNOSIS — I5033 Acute on chronic diastolic (congestive) heart failure: Secondary | ICD-10-CM | POA: Insufficient documentation

## 2023-02-17 DIAGNOSIS — I4819 Other persistent atrial fibrillation: Secondary | ICD-10-CM | POA: Diagnosis not present

## 2023-02-17 HISTORY — PX: RIGHT HEART CATH: CATH118263

## 2023-02-17 LAB — CBC
HCT: 43.7 % (ref 39.0–52.0)
Hemoglobin: 14.4 g/dL (ref 13.0–17.0)
MCH: 21.8 pg — ABNORMAL LOW (ref 26.0–34.0)
MCHC: 33 g/dL (ref 30.0–36.0)
MCV: 66.2 fL — ABNORMAL LOW (ref 80.0–100.0)
Platelets: 210 10*3/uL (ref 150–400)
RBC: 6.6 MIL/uL — ABNORMAL HIGH (ref 4.22–5.81)
RDW: 21.5 % — ABNORMAL HIGH (ref 11.5–15.5)
WBC: 8.2 10*3/uL (ref 4.0–10.5)
nRBC: 0 % (ref 0.0–0.2)

## 2023-02-17 LAB — BASIC METABOLIC PANEL
Anion gap: 10 (ref 5–15)
BUN: 24 mg/dL — ABNORMAL HIGH (ref 6–20)
CO2: 26 mmol/L (ref 22–32)
Calcium: 9.1 mg/dL (ref 8.9–10.3)
Chloride: 93 mmol/L — ABNORMAL LOW (ref 98–111)
Creatinine, Ser: 1.36 mg/dL — ABNORMAL HIGH (ref 0.61–1.24)
GFR, Estimated: >60 mL/min (ref >60)
Glucose, Bld: 208 mg/dL — ABNORMAL HIGH (ref 70–99)
Potassium: 4 mmol/L (ref 3.5–5.1)
Sodium: 129 mmol/L — ABNORMAL LOW (ref 135–145)

## 2023-02-17 LAB — POCT I-STAT EG7
Acid-Base Excess: 3 mmol/L — ABNORMAL HIGH (ref 0.0–2.0)
Acid-Base Excess: 3 mmol/L — ABNORMAL HIGH (ref 0.0–2.0)
Acid-Base Excess: 3 mmol/L — ABNORMAL HIGH (ref 0.0–2.0)
Acid-Base Excess: 2 mmol/L (ref 0.0–2.0)
Acid-Base Excess: 3 mmol/L — ABNORMAL HIGH (ref 0.0–2.0)
Bicarbonate: 27.3 mmol/L (ref 20.0–28.0)
Bicarbonate: 27.9 mmol/L (ref 20.0–28.0)
Bicarbonate: 28.7 mmol/L — ABNORMAL HIGH (ref 20.0–28.0)
Bicarbonate: 27.3 mmol/L (ref 20.0–28.0)
Bicarbonate: 28.1 mmol/L — ABNORMAL HIGH (ref 20.0–28.0)
Calcium, Ion: 1.2 mmol/L (ref 1.15–1.40)
Calcium, Ion: 1.21 mmol/L (ref 1.15–1.40)
Calcium, Ion: 1.23 mmol/L (ref 1.15–1.40)
Calcium, Ion: 1.14 mmol/L — ABNORMAL LOW (ref 1.15–1.40)
Calcium, Ion: 1.24 mmol/L (ref 1.15–1.40)
HCT: 52 % (ref 39.0–52.0)
HCT: 53 % — ABNORMAL HIGH (ref 39.0–52.0)
HCT: 53 % — ABNORMAL HIGH (ref 39.0–52.0)
HCT: 52 % (ref 39.0–52.0)
HCT: 53 % — ABNORMAL HIGH (ref 39.0–52.0)
Hemoglobin: 17.7 g/dL — ABNORMAL HIGH (ref 13.0–17.0)
Hemoglobin: 18 g/dL — ABNORMAL HIGH (ref 13.0–17.0)
Hemoglobin: 18 g/dL — ABNORMAL HIGH (ref 13.0–17.0)
Hemoglobin: 17.7 g/dL — ABNORMAL HIGH (ref 13.0–17.0)
Hemoglobin: 18 g/dL — ABNORMAL HIGH (ref 13.0–17.0)
O2 Saturation: 61 %
O2 Saturation: 69 %
O2 Saturation: 71 %
O2 Saturation: 65 %
O2 Saturation: 70 %
Potassium: 4 mmol/L (ref 3.5–5.1)
Potassium: 4.1 mmol/L (ref 3.5–5.1)
Potassium: 4.1 mmol/L (ref 3.5–5.1)
Potassium: 3.9 mmol/L (ref 3.5–5.1)
Potassium: 4 mmol/L (ref 3.5–5.1)
Sodium: 135 mmol/L (ref 135–145)
Sodium: 135 mmol/L (ref 135–145)
Sodium: 136 mmol/L (ref 135–145)
Sodium: 136 mmol/L (ref 135–145)
Sodium: 137 mmol/L (ref 135–145)
TCO2: 29 mmol/L (ref 22–32)
TCO2: 29 mmol/L (ref 22–32)
TCO2: 30 mmol/L (ref 22–32)
TCO2: 29 mmol/L (ref 22–32)
TCO2: 29 mmol/L (ref 22–32)
pCO2, Ven: 41.4 mmHg — ABNORMAL LOW (ref 44–60)
pCO2, Ven: 42.3 mmHg — ABNORMAL LOW (ref 44–60)
pCO2, Ven: 45 mmHg (ref 44–60)
pCO2, Ven: 41.7 mmHg — ABNORMAL LOW (ref 44–60)
pCO2, Ven: 43.2 mmHg — ABNORMAL LOW (ref 44–60)
pH, Ven: 7.413 (ref 7.25–7.43)
pH, Ven: 7.427 (ref 7.25–7.43)
pH, Ven: 7.428 (ref 7.25–7.43)
pH, Ven: 7.422 (ref 7.25–7.43)
pH, Ven: 7.424 (ref 7.25–7.43)
pO2, Ven: 31 mmHg — CL (ref 32–45)
pO2, Ven: 35 mmHg (ref 32–45)
pO2, Ven: 36 mmHg (ref 32–45)
pO2, Ven: 33 mmHg (ref 32–45)
pO2, Ven: 36 mmHg (ref 32–45)

## 2023-02-17 LAB — MAGNESIUM: Magnesium: 1.7 mg/dL (ref 1.7–2.4)

## 2023-02-17 LAB — COOXEMETRY PANEL
Carboxyhemoglobin: 2.3 % — ABNORMAL HIGH (ref 0.5–1.5)
Methemoglobin: <0.7 % (ref 0.0–1.5)
O2 Saturation: 67.8 %
Total hemoglobin: 14.8 g/dL (ref 12.0–16.0)

## 2023-02-17 SURGERY — RIGHT HEART CATH
Anesthesia: LOCAL

## 2023-02-17 MED ORDER — MAGNESIUM SULFATE 4 GM/100ML IV SOLN
4.0000 g | Freq: Once | INTRAVENOUS | Status: AC
  Administered 2023-02-17: 4 g via INTRAVENOUS
  Filled 2023-02-17: qty 100

## 2023-02-17 MED ORDER — SODIUM CHLORIDE 0.9% FLUSH: 3.0000 mL | INTRAVENOUS | Status: DC | PRN

## 2023-02-17 MED ORDER — SODIUM CHLORIDE 0.9 % IV SOLN: INTRAVENOUS | Status: DC

## 2023-02-17 MED ORDER — LIDOCAINE HCL (PF) 1 % IJ SOLN
INTRAMUSCULAR | Status: AC
  Filled 2023-02-17: qty 30

## 2023-02-17 MED ORDER — SODIUM CHLORIDE 0.9 % IV SOLN: 250.0000 mL | INTRAVENOUS | Status: DC | PRN

## 2023-02-17 MED ORDER — SODIUM CHLORIDE 0.9% FLUSH
3.0000 mL | Freq: Two times a day (BID) | INTRAVENOUS | Status: DC
  Administered 2023-02-18: 3 mL via INTRAVENOUS

## 2023-02-17 MED ORDER — LIDOCAINE HCL (PF) 1 % IJ SOLN
INTRAMUSCULAR | Status: DC | PRN
  Administered 2023-02-17: 2 mL

## 2023-02-17 MED ORDER — MUPIROCIN 2 % EX OINT
1.0000 | TOPICAL_OINTMENT | Freq: Two times a day (BID) | CUTANEOUS | Status: DC
  Administered 2023-02-18 – 2023-02-19 (×3): 1 via NASAL
  Filled 2023-02-17: qty 22

## 2023-02-17 MED ORDER — HEPARIN (PORCINE) IN NACL 1000-0.9 UT/500ML-% IV SOLN
INTRAVENOUS | Status: DC | PRN
  Administered 2023-02-17: 500 mL

## 2023-02-17 MED ORDER — CHLORHEXIDINE GLUCONATE CLOTH 2 % EX PADS: 6.0000 | MEDICATED_PAD | Freq: Every day | CUTANEOUS | Status: DC

## 2023-02-17 MED ORDER — POTASSIUM CHLORIDE CRYS ER 20 MEQ PO TBCR: 20.0000 meq | EXTENDED_RELEASE_TABLET | Freq: Once | ORAL | Status: DC

## 2023-02-17 SURGICAL SUPPLY — 7 items
CATH BALLN WEDGE 5F 110CM (CATHETERS) IMPLANT
GUIDEWIRE .025 260CM (WIRE) IMPLANT
PACK CARDIAC CATHETERIZATION (CUSTOM PROCEDURE TRAY) ×1 IMPLANT
SHEATH GLIDE SLENDER 4/5FR (SHEATH) IMPLANT
TRANSDUCER W/STOPCOCK (MISCELLANEOUS) ×1 IMPLANT
TUBING ART PRESS 72  MALE/FEM (TUBING) ×1
TUBING ART PRESS 72 MALE/FEM (TUBING) IMPLANT

## 2023-02-17 NOTE — H&P (View-Only) (Signed)
Advanced Heart Failure Rounding Note   Subjective:    Diuresing with IV lasix and metolazone. Weight down 2 pounds overnight (14 total) CVP 15. -2.7 L UOP Co-ox 68% Renal function stable K 4  Now on IV amio. AF rates improved    Feels better this morning. Denies CP/SOB.    Objective:   Weight Range:  Vital Signs:   Temp:  [97.6 F (36.4 C)-98.2 F (36.8 C)] 97.6 F (36.4 C) (03/18 0236) Pulse Rate:  [88-100] 88 (03/18 0236) Resp:  [16-20] 18 (03/18 0236) BP: (101-122)/(68-92) 122/77 (03/18 0236) SpO2:  [92 %-99 %] 95 % (03/18 0236) Weight:  [127.5 kg] 127.5 kg (03/18 0236) Last BM Date :  (pta)  Weight change: Filed Weights   02/15/23 0500 02/16/23 0500 02/17/23 0236  Weight: 130.6 kg 128.7 kg 127.5 kg    Intake/Output:   Intake/Output Summary (Last 24 hours) at 02/17/2023 0730 Last data filed at 02/17/2023 0600 Gross per 24 hour  Intake 720 ml  Output 2700 ml  Net -1980 ml     Physical Exam: General:  well appearing.  No respiratory difficulty HEENT: normal Neck: supple. JVD to jaw. Carotids 2+ bilat; no bruits. No lymphadenopathy or thyromegaly appreciated. Cor: PMI nondisplaced. Regular rate & irregular rhythm. No rubs, gallops or murmurs. Lungs: clear Abdomen: soft, nontender, nondistended. No hepatosplenomegaly. No bruits or masses. Good bowel sounds. Extremities: no cyanosis, clubbing, rash, +1-2 BLE edema. + UNNA boots. PICC RUE Neuro: alert & oriented x 3, cranial nerves grossly intact. moves all 4 extremities w/o difficulty. Affect pleasant.   Telemetry: AF 90s-100s (Personally reviewed)    Labs: Basic Metabolic Panel: Recent Labs  Lab 02/14/23 1106 02/15/23 0514 02/16/23 0525 02/17/23 0330  NA 139 138 135 129*  K 3.3* 3.2* 3.6 4.0  CL 100 98 94* 93*  CO2 29 28 27 26   GLUCOSE 113* 112* 120* 208*  BUN 7 9 17  24*  CREATININE 0.83 0.92 1.14 1.36*  CALCIUM 8.7* 8.9 8.9 9.1  MG  --   --  1.7 1.7    Liver Function Tests: Recent Labs   Lab 02/14/23 1106  AST 31  ALT 15  ALKPHOS 54  BILITOT 1.1  PROT 7.2  ALBUMIN 3.5   No results for input(s): "LIPASE", "AMYLASE" in the last 168 hours. No results for input(s): "AMMONIA" in the last 168 hours.  CBC: Recent Labs  Lab 02/14/23 1106 02/15/23 0514 02/16/23 0525 02/17/23 0330  WBC 5.0 5.2 5.1 8.2  HGB 12.2* 12.6* 13.4 14.4  HCT 38.3* 38.1* 41.2 43.7  MCV 67.3* 66.7* 67.3* 66.2*  PLT 199 180 158 210   Cardiac Enzymes: No results for input(s): "CKTOTAL", "CKMB", "CKMBINDEX", "TROPONINI" in the last 168 hours.  BNP: BNP (last 3 results) Recent Labs    01/23/23 1710 02/14/23 1106  BNP 377.4* 248.5*    ProBNP (last 3 results) No results for input(s): "PROBNP" in the last 8760 hours.  Other results:  Imaging: No results found.  Medications:   Scheduled Medications:  apixaban  5 mg Oral BID   Chlorhexidine Gluconate Cloth  6 each Topical Daily   fluticasone  2 spray Each Nare Daily   furosemide  80 mg Intravenous BID   levothyroxine  50 mcg Oral Q0600   metoprolol succinate  50 mg Oral Daily   predniSONE  40 mg Oral Q breakfast   rosuvastatin  10 mg Oral Daily   sacubitril-valsartan  1 tablet Oral BID   sodium chloride  flush  10-40 mL Intracatheter Q12H   spironolactone  25 mg Oral Daily    Infusions:  amiodarone 30 mg/hr (02/17/23 0217)    PRN Medications: albuterol, mouth rinse, sodium chloride flush, traZODone  Assessment/Plan:  1. 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 - Admitted from AHF clinic on 3/15 for volume overload. NYHA III-IIIb symptoms, ReDs 43%. - Diuresing well but still overloaded. CVP 15 Continue IV Lasix 80 mg BID, no further metolazone today. SCr 1.14>1.36, consider inotropic support for further diuresis - Continue spiro 25 mg daily. - Continue entresto 24-26 mg BID - Continue  Unna boots. - Will need RHC prior to DC-CV once diuresed to further assess PAH/RV failure. Probably won't be ready until tomorrow/Wednesday - Strict I&O, daily weights   2. Persistent Atrial fibrillation with RVR - on-going since 2019 - TEE (9/18) moderate size thrombus in the LAA, no DCCV.   - TEE (4/19) no LAA thrombus, DCCV --> NSR - Admit 2/24 with AF with RVR  - on amiodarone 200 mg bid, unclear if he has been taking this consistently - Now on IV amio. Rates better controlled but still elevated - Continue Toprol XL 50 mg daily - Continue Eliquis 5 mg bid - Missed Eliquis doses last week, TEE/DCCV early thisweek once better diuresed - Given duration of AF - not clear that he will be able to maintain NSR but given difficulty with rate control and managing HF will attempt DC-CV with amio support   3. 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  - Mild MR   4. Probable OHS/OSA - Needs sleep eval   5. DM2 - Controlled. - hgb A1C 6.1 - Plan to start SGLT2i during admission  6. 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 - Continue Eliquis, statin.   7. Obesity - Body mass index is 39.2 kg/m.   8. Hypokalemia - K 4 - Supp K as needed with diuresis  - Continue spiro  9. Low MCV - Tsat 7, ferritin 24. Feraheme prior to d/c.   10. H/o noncompliance - left AMA last admit   11. Acute gout in left hand - prednisone burst   12. AKI - SCr up to 1.36 with diuresis - baseline .9 - avoid hypotension   Length of Stay: [place] AGACNP-BC 02/17/2023, 7:30 AM  Advanced Heart Failure Team Pager [phone] (M-F; [place])  Please contact [facility] for night-coverage after hours (4p -7a ) and weekends on [email or web address]  Patient seen and examined with the above-signed Advanced Practice Provider and/or Housestaff. I personally reviewed laboratory data, imaging  studies and relevant notes. I independently examined the patient and formulated the important aspects of the plan. I have edited the note to reflect any of my changes or salient points. I have personally discussed the plan with the patient and/or family.  Remains on IV lasix and metolazone. Weight trending down slowly. Remains on amio for AF with RVR. Rate control improved. Gout improving with prednisone  General:  Sitting in chair No resp difficulty HEENT: normal Neck: supple. JVP to jaw. Carotids 2+ bilat; no bruits. No lymphadenopathy or thryomegaly appreciated. Cor: PMI nondisplaced. Irregular rate & rhythm. No rubs, gallops or murmurs. Lungs: clear Abdomen: obese  soft, nontender, nondistended. No hepatosplenomegaly. No bruits or masses. Good bowel sounds. Extremities: no cyanosis, clubbing, rash, 1+ edema + UNNA Neuro: alert & orientedx3, cranial nerves grossly intact. moves all 4 extremities w/o difficulty. Affect pleasant   Continue IV diuresis and metoalzone. Continue IV amio. Suspect we may not be able to get CVP down too low with degree of RV failure. Will plan RHC today and TEE/DC-CV tomorrow or Wednesday.  Glori Bickers, MD  8:49 AM

## 2023-02-17 NOTE — Interval H&P Note (Signed)
History and Physical Interval Note:  02/17/2023 12:51 PM  Parker [name]  has presented today for surgery, with the diagnosis of heart failure.  The various methods of treatment have been discussed with the patient and family. After consideration of risks, benefits and other options for treatment, the patient has consented to  Procedure(s): RIGHT HEART CATH (N/A) as a surgical intervention.  The patient's history has been reviewed, patient examined, no change in status, stable for surgery.  I have reviewed the patient's chart and labs.  Questions were answered to the patient's satisfaction.     Parker [name]

## 2023-02-17 NOTE — Progress Notes (Addendum)
Advanced Heart Failure Rounding Note   Subjective:    Diuresing with IV lasix and metolazone. Weight down 2 pounds overnight (14 total) CVP 15. -2.7 L UOP Co-ox 68% Renal function stable K 4  Now on IV amio. AF rates improved    Feels better this morning. Denies CP/SOB.    Objective:   Weight Range:  Vital Signs:   Temp:  [97.6 F (36.4 C)-98.2 F (36.8 C)] 97.6 F (36.4 C) (03/18 0236) Pulse Rate:  [88-100] 88 (03/18 0236) Resp:  [16-20] 18 (03/18 0236) BP: (101-122)/(68-92) 122/77 (03/18 0236) SpO2:  [92 %-99 %] 95 % (03/18 0236) Weight:  [127.5 kg] 127.5 kg (03/18 0236) Last BM Date :  (pta)  Weight change: Filed Weights   02/15/23 0500 02/16/23 0500 02/17/23 0236  Weight: 130.6 kg 128.7 kg 127.5 kg    Intake/Output:   Intake/Output Summary (Last 24 hours) at 02/17/2023 0730 Last data filed at 02/17/2023 0600 Gross per 24 hour  Intake 720 ml  Output 2700 ml  Net -1980 ml     Physical Exam: General:  well appearing.  No respiratory difficulty HEENT: normal Neck: supple. JVD to jaw. Carotids 2+ bilat; no bruits. No lymphadenopathy or thyromegaly appreciated. Cor: PMI nondisplaced. Regular rate & irregular rhythm. No rubs, gallops or murmurs. Lungs: clear Abdomen: soft, nontender, nondistended. No hepatosplenomegaly. No bruits or masses. Good bowel sounds. Extremities: no cyanosis, clubbing, rash, +1-2 BLE edema. + UNNA boots. PICC RUE Neuro: alert & oriented x 3, cranial nerves grossly intact. moves all 4 extremities w/o difficulty. Affect pleasant.   Telemetry: AF 90s-100s (Personally reviewed)    Labs: Basic Metabolic Panel: Recent Labs  Lab 02/14/23 1106 02/15/23 0514 02/16/23 0525 02/17/23 0330  NA 139 138 135 129*  K 3.3* 3.2* 3.6 4.0  CL 100 98 94* 93*  CO2 29 28 27 26   GLUCOSE 113* 112* 120* 208*  BUN 7 9 17  24*  CREATININE 0.83 0.92 1.14 1.36*  CALCIUM 8.7* 8.9 8.9 9.1  MG  --   --  1.7 1.7    Liver Function Tests: Recent Labs   Lab 02/14/23 1106  AST 31  ALT 15  ALKPHOS 54  BILITOT 1.1  PROT 7.2  ALBUMIN 3.5   No results for input(s): "LIPASE", "AMYLASE" in the last 168 hours. No results for input(s): "AMMONIA" in the last 168 hours.  CBC: Recent Labs  Lab 02/14/23 1106 02/15/23 0514 02/16/23 0525 02/17/23 0330  WBC 5.0 5.2 5.1 8.2  HGB 12.2* 12.6* 13.4 14.4  HCT 38.3* 38.1* 41.2 43.7  MCV 67.3* 66.7* 67.3* 66.2*  PLT 199 180 158 210   Cardiac Enzymes: No results for input(s): "CKTOTAL", "CKMB", "CKMBINDEX", "TROPONINI" in the last 168 hours.  BNP: BNP (last 3 results) Recent Labs    01/23/23 1710 02/14/23 1106  BNP 377.4* 248.5*    ProBNP (last 3 results) No results for input(s): "PROBNP" in the last 8760 hours.  Other results:  Imaging: No results found.  Medications:   Scheduled Medications:  apixaban  5 mg Oral BID   Chlorhexidine Gluconate Cloth  6 each Topical Daily   fluticasone  2 spray Each Nare Daily   furosemide  80 mg Intravenous BID   levothyroxine  50 mcg Oral Q0600   metoprolol succinate  50 mg Oral Daily   predniSONE  40 mg Oral Q breakfast   rosuvastatin  10 mg Oral Daily   sacubitril-valsartan  1 tablet Oral BID   sodium chloride  flush  10-40 mL Intracatheter Q12H   spironolactone  25 mg Oral Daily    Infusions:  amiodarone 30 mg/hr (02/17/23 0217)    PRN Medications: albuterol, mouth rinse, sodium chloride flush, traZODone  Assessment/Plan:  1. 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 - Admitted from AHF clinic on 3/15 for volume overload. NYHA III-IIIb symptoms, ReDs 43%. - Diuresing well but still overloaded. CVP 15 Continue IV Lasix 80 mg BID, no further metolazone today. SCr 1.14>1.36, consider inotropic support for further diuresis - Continue spiro 25 mg daily. - Continue entresto 24-26 mg BID - Continue  Unna boots. - Will need RHC prior to DC-CV once diuresed to further assess PAH/RV failure. Probably won't be ready until tomorrow/Wednesday - Strict I&O, daily weights   2. Persistent Atrial fibrillation with RVR - on-going since 2019 - TEE (9/18) moderate size thrombus in the LAA, no DCCV.   - TEE (4/19) no LAA thrombus, DCCV --> NSR - Admit 2/24 with AF with RVR  - on amiodarone 200 mg bid, unclear if he has been taking this consistently - Now on IV amio. Rates better controlled but still elevated - Continue Toprol XL 50 mg daily - Continue Eliquis 5 mg bid - Missed Eliquis doses last week, TEE/DCCV early thisweek once better diuresed - Given duration of AF - not clear that he will be able to maintain NSR but given difficulty with rate control and managing HF will attempt DC-CV with amio support   3. 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  - Mild MR   4. Probable OHS/OSA - Needs sleep eval   5. DM2 - Controlled. - hgb A1C 6.1 - Plan to start SGLT2i during admission  6. 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 - Continue Eliquis, statin.   7. Obesity - Body mass index is 39.2 kg/m.   8. Hypokalemia - K 4 - Supp K as needed with diuresis  - Continue spiro  9. Low MCV - Tsat 7, ferritin 24. Feraheme prior to d/c.   10. H/o noncompliance - left AMA last admit   11. Acute gout in left hand - prednisone burst   12. AKI - SCr up to 1.36 with diuresis - baseline .9 - avoid hypotension   Length of Stay: [place] AGACNP-BC 02/17/2023, 7:30 AM  Advanced Heart Failure Team Pager [phone] (M-F; [place])  Please contact [facility] for night-coverage after hours (4p -7a ) and weekends on [email or web address]  Patient seen and examined with the above-signed Advanced Practice Provider and/or Housestaff. I personally reviewed laboratory data, imaging  studies and relevant notes. I independently examined the patient and formulated the important aspects of the plan. I have edited the note to reflect any of my changes or salient points. I have personally discussed the plan with the patient and/or family.  Remains on IV lasix and metolazone. Weight trending down slowly. Remains on amio for AF with RVR. Rate control improved. Gout improving with prednisone  General:  Sitting in chair No resp difficulty HEENT: normal Neck: supple. JVP to jaw. Carotids 2+ bilat; no bruits. No lymphadenopathy or thryomegaly appreciated. Cor: PMI nondisplaced. Irregular rate & rhythm. No rubs, gallops or murmurs. Lungs: clear Abdomen: obese  soft, nontender, nondistended. No hepatosplenomegaly. No bruits or masses. Good bowel sounds. Extremities: no cyanosis, clubbing, rash, 1+ edema + UNNA Neuro: alert & orientedx3, cranial nerves grossly intact. moves all 4 extremities w/o difficulty. Affect pleasant   Continue IV diuresis and metoalzone. Continue IV amio. Suspect we may not be able to get CVP down too low with degree of RV failure. Will plan RHC today and TEE/DC-CV tomorrow or Wednesday.  Glori Bickers, MD  8:49 AM

## 2023-02-17 NOTE — TOC Benefit Eligibility Note (Signed)
Patient Advocate Encounter  Insurance verification completed.    The patient is currently admitted and upon discharge could be taking Entresto 24-26 mg.  The current 30 day co-pay is $47.00.   The patient is currently admitted and upon discharge could be taking Farxiga 10 mg.  The current 30 day co-pay is $47.00.   The patient is currently admitted and upon discharge could be taking Jardiance 10 mg.  The current 30 day co-pay is $47.00.   The patient is insured through [employer] Part D   Towana Stenglein, CPHT Pharmacy Patient Advocate Specialist [facility] Patient Advocate Team Direct Number: [phone]  Fax: [phone]       

## 2023-02-17 NOTE — Care Management Important Message (Signed)
Important Message  Patient Details  Name: Parker [name] MRN: [id number] Date of Birth: [birth year]   Medicare Important Message Given:  Yes     Orbie Pyo 02/17/2023, 2:28 PM

## 2023-02-18 ENCOUNTER — Encounter (HOSPITAL_COMMUNITY): Payer: Self-pay | Admitting: Internal Medicine

## 2023-02-18 DIAGNOSIS — I4891 Unspecified atrial fibrillation: Secondary | ICD-10-CM | POA: Diagnosis not present

## 2023-02-18 LAB — CBC
HCT: 42.5 % (ref 39.0–52.0)
Hemoglobin: 13.9 g/dL (ref 13.0–17.0)
MCH: 21.9 pg — ABNORMAL LOW (ref 26.0–34.0)
MCHC: 32.7 g/dL (ref 30.0–36.0)
MCV: 67 fL — ABNORMAL LOW (ref 80.0–100.0)
Platelets: 212 10*3/uL (ref 150–400)
RBC: 6.34 MIL/uL — ABNORMAL HIGH (ref 4.22–5.81)
RDW: 21 % — ABNORMAL HIGH (ref 11.5–15.5)
WBC: 12.5 10*3/uL — ABNORMAL HIGH (ref 4.0–10.5)
nRBC: 0 % (ref 0.0–0.2)

## 2023-02-18 LAB — MAGNESIUM: Magnesium: 2.6 mg/dL — ABNORMAL HIGH (ref 1.7–2.4)

## 2023-02-18 LAB — BASIC METABOLIC PANEL
Anion gap: 19 — ABNORMAL HIGH (ref 5–15)
BUN: 32 mg/dL — ABNORMAL HIGH (ref 6–20)
CO2: 24 mmol/L (ref 22–32)
Calcium: 8.7 mg/dL — ABNORMAL LOW (ref 8.9–10.3)
Chloride: 88 mmol/L — ABNORMAL LOW (ref 98–111)
Creatinine, Ser: 1.29 mg/dL — ABNORMAL HIGH (ref 0.61–1.24)
GFR, Estimated: >60 mL/min (ref >60)
Glucose, Bld: 356 mg/dL — ABNORMAL HIGH (ref 70–99)
Potassium: 3.2 mmol/L — ABNORMAL LOW (ref 3.5–5.1)
Sodium: 131 mmol/L — ABNORMAL LOW (ref 135–145)

## 2023-02-18 LAB — COOXEMETRY PANEL
Carboxyhemoglobin: 1.8 % — ABNORMAL HIGH (ref 0.5–1.5)
Methemoglobin: <0.7 % (ref 0.0–1.5)
O2 Saturation: 64.2 %
Total hemoglobin: 14.1 g/dL (ref 12.0–16.0)

## 2023-02-18 MED ORDER — POTASSIUM CHLORIDE CRYS ER 20 MEQ PO TBCR
40.0000 meq | EXTENDED_RELEASE_TABLET | ORAL | Status: AC
  Administered 2023-02-18 (×2): 40 meq via ORAL
  Filled 2023-02-18 (×2): qty 2

## 2023-02-18 MED ORDER — EMPAGLIFLOZIN 10 MG PO TABS
10.0000 mg | ORAL_TABLET | Freq: Every day | ORAL | Status: DC
  Administered 2023-02-18 – 2023-02-19 (×2): 10 mg via ORAL
  Filled 2023-02-18 (×2): qty 1

## 2023-02-18 NOTE — Progress Notes (Addendum)
Advanced Heart Failure Rounding Note   Subjective:    Diuresing with IV lasix and metolazone. Weight down 3 pounds overnight (17 total) CVP 10. -2.9 L UOP Co-ox 64% Renal function stable K 3.2  Remains on IV amio. AF rates improved    RHC 3/18 RA = 15 RV = 41/18 PA =  44/23 (33)  PCW = 19 Fick cardiac output/index = 6.6/2.7 PVR = 2.2 WU Ao sat = 95%  PA sat = 70%, 69% RA sat = 70%  SVC sat = 65% PAPi = 1.4  1. 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 with elevated filling pressures  Feels good this morning, up in chair. Denies CP/SOB.   Objective:   Weight Range:  Vital Signs:   Temp:  [97.5 F (36.4 C)-98.2 F (36.8 C)] 98.2 F (36.8 C) (03/19 0356) Pulse Rate:  [89-111] 94 (03/19 0356) Resp:  [13-37] 14 (03/19 0358) BP: (105-147)/(66-122) 108/88 (03/19 0356) SpO2:  [92 %-97 %] 95 % (03/19 0356) Weight:  [126.3 kg] 126.3 kg (03/19 0356) Last BM Date :  (pta)  Weight change: Filed Weights   02/16/23 0500 02/17/23 0236 02/18/23 0356  Weight: 128.7 kg 127.5 kg 126.3 kg    Intake/Output:   Intake/Output Summary (Last 24 hours) at 02/18/2023 0703 Last data filed at 02/18/2023 0600 Gross per 24 hour  Intake 1666.16 ml  Output 2975 ml  Net -1308.84 ml     Physical Exam: CVP 10 General:  well appearing.  No respiratory difficulty HEENT: normal Neck: supple. JVD ~10 cm. Carotids 2+ bilat; no bruits. No lymphadenopathy or thyromegaly appreciated. Cor: PMI nondisplaced. Regular rate & irregular rhythm. No rubs, gallops or murmurs. Lungs: clear Abdomen: soft, nontender, nondistended. No hepatosplenomegaly. No bruits or masses. Good bowel sounds. Extremities: no cyanosis, clubbing, rash, +1 BLE edema. +UNNA boots. PICC RUE Neuro: alert & oriented x 3, cranial nerves grossly intact. moves all 4 extremities w/o difficulty. Affect pleasant.   Telemetry: AF 90s (Personally reviewed)    Labs: Basic Metabolic Panel: Recent Labs  Lab 02/14/23 1106  02/15/23 0514 02/16/23 0525 02/17/23 0330 02/17/23 1322 02/17/23 1326 02/17/23 1330 02/17/23 1331 02/18/23 0410  NA 139 138 135 129* 135  135 136 137 136 131*  K 3.3* 3.2* 3.6 4.0 4.1  4.0 4.1 3.9 4.0 3.2*  CL 100 98 94* 93*  --   --   --   --  88*  CO2 29 28 27 26   --   --   --   --  24  GLUCOSE 113* 112* 120* 208*  --   --   --   --  356*  BUN 7 9 17  24*  --   --   --   --  32*  CREATININE 0.83 0.92 1.14 1.36*  --   --   --   --  1.29*  CALCIUM 8.7* 8.9 8.9 9.1  --   --   --   --  8.7*  MG  --   --  1.7 1.7  --   --   --   --   --     Liver Function Tests: Recent Labs  Lab 02/14/23 1106  AST 31  ALT 15  ALKPHOS 54  BILITOT 1.1  PROT 7.2  ALBUMIN 3.5   No results for input(s): "LIPASE", "AMYLASE" in the last 168 hours. No results for input(s): "AMMONIA" in the last 168 hours.  CBC: Recent Labs  Lab 02/14/23 1106 02/15/23  [id number] 02/16/23 0525 02/17/23 0330 02/17/23 1322 02/17/23 1326 02/17/23 1330 02/17/23 1331 02/18/23 0410  WBC 5.0 5.2 5.1 8.2  --   --   --   --  12.5*  HGB 12.2* 12.6* 13.4 14.4 18.0*  18.0* 17.7* 17.7* 18.0* 13.9  HCT 38.3* 38.1* 41.2 43.7 53.0*  53.0* 52.0 52.0 53.0* 42.5  MCV 67.3* 66.7* 67.3* 66.2*  --   --   --   --  67.0*  PLT 199 180 158 210  --   --   --   --  212   Cardiac Enzymes: No results for input(s): "CKTOTAL", "CKMB", "CKMBINDEX", "TROPONINI" in the last 168 hours.  BNP: BNP (last 3 results) Recent Labs    01/23/23 1710 02/14/23 1106  BNP 377.4* 248.5*    ProBNP (last 3 results) No results for input(s): "PROBNP" in the last 8760 hours.  Other results:  Imaging: CARDIAC CATHETERIZATION  Result Date: 02/17/2023 Findings: RA = 15 RV = 41/18 PA =  44/23 (33) PCW = 19 Fick cardiac output/index = 6.6/2.7 PVR = 2.2 WU Ao sat = 95% PA sat = 70%, 69% RA sat = 70% SVC sat = 65% PAPi = 1.4 Assessment: 1. 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 Will  need sleep study. Glori Bickers, MD 3:05 PM   Medications:   Scheduled Medications:  apixaban  5 mg Oral BID   Chlorhexidine Gluconate Cloth  6 each Topical Daily   fluticasone  2 spray Each Nare Daily   furosemide  80 mg Intravenous BID   levothyroxine  50 mcg Oral Q0600   metoprolol succinate  50 mg Oral Daily   mupirocin ointment  1 Application Nasal BID   potassium chloride  20 mEq Oral Once   rosuvastatin  10 mg Oral Daily   sacubitril-valsartan  1 tablet Oral BID   sodium chloride flush  10-40 mL Intracatheter Q12H   sodium chloride flush  3 mL Intravenous Q12H   spironolactone  25 mg Oral Daily    Infusions:  amiodarone 30 mg/hr (02/18/23 0208)    PRN Medications: albuterol, mouth rinse, sodium chloride flush, traZODone  Assessment/Plan:  1. 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 - Admitted from AHF clinic on 3/15 for volume overload. NYHA III-IIIb symptoms, ReDs 43%. - Diuresing well but still overloaded. CVP 10 Will do morning dose IV lasix, stop after that. No further metolazone today. SCr 1.14>1.36>1.29. Plan to start PO lasix 40 mg daily tomorrow.  - RHC 3/18: RA 15, PA 44/23 (33), CO 6.6, CI 2.8, PAPi 1.4. 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 with elevated filling pressures - Continue spiro 25 mg daily. - Continue entresto 24-26 mg BID - Start Jardiance 10 mg daily - Continue Unna boots. - Strict I&O, daily weights   2. Persistent Atrial fibrillation with RVR - on-going since 2019 - TEE (9/18) moderate size thrombus in the LAA, no DCCV.   - TEE (4/19) no LAA thrombus, DCCV --> NSR - Admit 2/24 with AF with RVR  - on amiodarone 200 mg bid, unclear if he has been taking this consistently - Now on IV amio. Rates better controlled but still elevated - Continue Toprol XL 50 mg daily - Continue Eliquis 5 mg bid -  Missed Eliquis doses last week - Given duration of  AF - not clear that he will be able to maintain NSR but given difficulty with rate control and managing HF will attempt DC-CV with amio support. TEE/DCCV scheduled for Wednesday   3. 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  - Mild MR   4. Probable OHS/OSA - Needs sleep eval   5. DM2 - Controlled. - hgb A1C 6.1 - starting Jardiance 10 mg daily  6. 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 - Continue Eliquis, statin.   7. Obesity - Body mass index is 38.83 kg/m.   8. Hypokalemia - K 3.2 - Supp K as needed with diuresis  - Continue spiro  9. Low MCV - Tsat 7, ferritin 24. Feraheme prior to d/c.   10. H/o noncompliance - left AMA last admit   11. Acute gout in left hand - prednisone burst   12. AKI - SCr up to 1.36>1.29 with diuresis - baseline .9 - avoid hypotension   Length of Stay: [place] AGACNP-BC 02/18/2023, 7:03 AM  Advanced Heart Failure Team Pager [phone] (M-F; 7a - 4p)  Please contact [facility] for night-coverage after hours (4p -7a ) and weekends on [email or web address]  Patient seen and examined with the above-signed Advanced Practice Provider and/or Housestaff. I personally reviewed laboratory data, imaging studies and relevant notes. I independently examined the patient and formulated the important aspects of the plan. I have edited the note to reflect any of my changes or salient points. I have personally discussed the plan with the patient and/or family.  Continues to diurese well on IV lasix. Weight down almost 20 pounds. AF better controlled on IV amio. Results of cath reviewed   Denies CP or SOB. Gout pain resolved. Numerous medication questions today.   General:  Well appearing. No resp difficulty HEENT: normal Neck: supple. JVP 9-10. Carotids 2+ bilat; no bruits. No  lymphadenopathy or thryomegaly appreciated. Cor: PMI nondisplaced. Irregular rate & rhythm. No rubs, gallops or murmurs. Lungs: clear Abdomen: obese soft, nontender, nondistended. No hepatosplenomegaly. No bruits or masses. Good bowel sounds. Extremities: no cyanosis, clubbing, rash, edema Neuro: alert & orientedx3, cranial nerves grossly intact. moves all 4 extremities w/o difficulty. Affect pleasant  Volume status appears optimized though CVP still a bit elevated in setting of RV> LV dysfunction.   Continue IV amio. Plan TEE/DC-CV tomorrow. Will need outpatient sleep study.   Glori Bickers, MD  10:49 PM

## 2023-02-18 NOTE — Progress Notes (Signed)
Orthopedic Tech Progress Note Patient Details:  Parker [name] [birth year] [id number]  Ortho Devices Type of Ortho Device: Haematologist Ortho Device/Splint Location: BLE Ortho Device/Splint Interventions: Ordered, Application   Post Interventions Patient Tolerated: Well Instructions Provided: Care of device  Parker [name] A [name] 02/18/2023, 4:13 PM

## 2023-02-18 NOTE — Progress Notes (Signed)
Unna boots removed. Called ortho tech to reapply.

## 2023-02-18 NOTE — Inpatient Diabetes Management (Signed)
Inpatient Diabetes Program Recommendations  AACE/ADA: New Consensus Statement on Inpatient Glycemic Control   Target Ranges:  Prepandial:   less than 140 mg/dL      Peak postprandial:   less than 180 mg/dL (1-2 hours)      Critically ill patients:  140 - 180 mg/dL    Latest Reference Range & Units 02/14/23 11:06 02/15/23 05:14 02/16/23 05:25 02/17/23 03:30 02/18/23 04:10  Glucose 70 - 99 mg/dL 113 (H) 112 (H) 120 (H) 208 (H) 356 (H)   Review of Glycemic Control  Diabetes history: DM2 Outpatient Diabetes medications: None Current orders for Inpatient glycemic control: Jardiance 10 mg daily  Inpatient Diabetes Program Recommendations:    Insulin: Noted Jardiance was ordered today. Please consider ordering CBGs AC&HS and Novolog 0-9 units TID with meals and Novolog 0-5 units QHS.  Thanks, Barnie Alderman, RN, MSN, [place] [facility] Coordinator Inpatient Diabetes Program [phone] (Team Pager from 8am to [place])

## 2023-02-18 NOTE — Plan of Care (Signed)

## 2023-02-18 NOTE — Plan of Care (Signed)
  Problem: Education: Goal: Knowledge of General Education information will improve Description: Including pain rating scale, medication(s)/side effects and non-pharmacologic comfort measures Outcome: Progressing   Problem: Health Behavior/Discharge Planning: Goal: Ability to manage health-related needs will improve Outcome: Progressing   Problem: Clinical Measurements: Goal: Ability to maintain clinical measurements within normal limits will improve Outcome: Progressing Goal: Will remain free from infection Outcome: Progressing Goal: Diagnostic test results will improve Outcome: Progressing Goal: Respiratory complications will improve Outcome: Progressing Goal: Cardiovascular complication will be avoided Outcome: Progressing   Problem: Activity: Goal: Risk for activity intolerance will decrease Outcome: Progressing   Problem: Nutrition: Goal: Adequate nutrition will be maintained Outcome: Progressing   Problem: Coping: Goal: Level of anxiety will decrease Outcome: Progressing   Problem: Elimination: Goal: Will not experience complications related to bowel motility Outcome: Progressing Goal: Will not experience complications related to urinary retention Outcome: Progressing   Problem: Pain Managment: Goal: General experience of comfort will improve Outcome: Progressing   Problem: Safety: Goal: Ability to remain free from injury will improve Outcome: Progressing   Problem: Skin Integrity: Goal: Risk for impaired skin integrity will decrease Outcome: Progressing   Problem: Education: Goal: Understanding of CV disease, CV risk reduction, and recovery process will improve Outcome: Progressing Goal: Individualized Educational Video(s) Outcome: Progressing   Problem: Activity: Goal: Ability to return to baseline activity level will improve Outcome: Progressing   Problem: Health Behavior/Discharge Planning: Goal: Ability to safely manage health-related needs  after discharge will improve Outcome: Progressing   

## 2023-02-18 NOTE — Discharge Summary (Incomplete)
Advanced Heart Failure Team  Discharge Summary   Patient ID: Parker [name] MRN: [id number], DOB/AGE: 1966/11/04 57 y.o. Admit date: 02/14/2023 D/C date:     02/19/2023   Primary Discharge Diagnoses:  Acute on chronic diastolic heart failure R>L HF in setting of AF with RVR and OHS/OSA Persistent atrial fibrillation with RVR, s/p TEE/DCCV  Valvular disease, Moderate TR  Probable OHS/OSA Hypokalemia Low MCV Acute gout in L hand AKI  Secondary Discharge Diagnoses:  DMII CVA Obesity   Hospital Course:  Parker [name] is a 57 y.o. male with HFrEF, DM2, atrial fibrillation, obesity and hypothyroidism. He presented to [facility] clinic 3/15 and was found to be in a fib RVR and markedly volume overloaded. Admitted for diuresis and TEE/DCCV.   During admission he diuresed very well with IV lasix and metolazone. Overall diuresed 20lbs. RHC showed mild pulmonary HTN with R>L heart failure with elevated filling pressures. Remained on IV amio until TEE/DCCV 3/20. TEE showed normal EF 55%, RV dilated w/ severe HK. No thrombus. Underwent successful DCCV to NSR. He was transitioned to PO amiodarone and PO torsemide + other GDMT.   Pt will continue to be followed closely in the HF clinic, f/u scheduled. Dr Haroldine Laws evaluated on 3/20 and deemed appropriate for discharge.     Discharge Weight Range: 275 lb  Discharge Vitals: Blood pressure (!) 112/91, pulse 66, temperature (!) 97.5 F (36.4 C), temperature source Oral, resp. rate 20, height 5\' 11"  (1.803 m), weight 125.1 kg, SpO2 99 %.  Labs: Lab Results  Component Value Date   WBC 12.4 (H) 02/19/2023   HGB 14.1 02/19/2023   HCT 43.1 02/19/2023   MCV 66.9 (L) 02/19/2023   PLT 227 02/19/2023    Recent Labs  Lab 02/14/23 1106 02/15/23 0514 02/19/23 0500  NA 139   < > 132*  K 3.3*   < > 4.1  CL 100   < > 98  CO2 29   < > 23  BUN 7   < > 32*  CREATININE 0.83   < > 1.14  CALCIUM 8.7*   < > 8.7*  PROT 7.2  --   --   BILITOT 1.1  --   --   ALKPHOS  54  --   --   ALT 15  --   --   AST 31  --   --   GLUCOSE 113*   < > 225*   < > = values in this interval not displayed.   No results found for: "CHOL", "HDL", "LDLCALC", "TRIG" BNP (last 3 results) Recent Labs    01/23/23 1710 02/14/23 1106  BNP 377.4* 248.5*    ProBNP (last 3 results) No results for input(s): "PROBNP" in the last 8760 hours.   Diagnostic Studies/Procedures   CARDIAC CATHETERIZATION  Result Date: 02/17/2023 Findings: RA = 15 RV = 41/18 PA =  44/23 (33) PCW = 19 Fick cardiac output/index = 6.6/2.7 PVR = 2.2 WU Ao sat = 95% PA sat = 70%, 69% RA sat = 70% SVC sat = 65% PAPi = 1.4 Assessment: 1. 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 Will need sleep study. Glori Bickers, MD 3:05 PM   Discharge Medications   Allergies as of 02/19/2023       Reactions   Iodinated Contrast Media Itching   Other Rash   Other reaction(s): Other (See Comments) Neoprene: Rash        Medication List  STOP taking these medications    losartan 50 MG tablet Commonly known as: COZAAR   magnesium oxide 400 MG tablet Commonly known as: MAG-OX   traMADol 50 MG tablet Commonly known as: Ultram       TAKE these medications    acetaminophen 500 MG tablet Commonly known as: TYLENOL Take 500-1,000 mg by mouth every 8 (eight) hours as needed for moderate pain.   albuterol 108 (90 Base) MCG/ACT inhaler Commonly known as: VENTOLIN HFA Inhale 2 puffs into the lungs every 6 (six) hours as needed for shortness of breath.   amiodarone 200 MG tablet Commonly known as: PACERONE Take 2 tablets (400 mg total) by mouth 2 (two) times daily for 7 days, THEN 1 tablet (200 mg total) 2 (two) times daily for 14 days, THEN 1 tablet (200 mg total) daily. Start taking on: February 19, 2023 What changed: See the new instructions.   colchicine 0.6 MG tablet Take 1 tablet (0.6 mg total) by mouth daily as needed (gout  flares).   Eliquis 5 MG Tabs tablet Generic drug: apixaban Take 1 tablet (5 mg total) by mouth 2 (two) times daily.   empagliflozin 10 MG Tabs tablet Commonly known as: JARDIANCE Take 1 tablet (10 mg total) by mouth daily.   fluticasone 50 MCG/ACT nasal spray Commonly known as: FLONASE Place 2 sprays into both nostrils daily. What changed:  when to take this reasons to take this   folic acid 1 MG tablet Commonly known as: FOLVITE Take 1 mg by mouth daily as needed (low iron).   levothyroxine 50 MCG tablet Commonly known as: SYNTHROID Take 50 mcg by mouth daily.   meclizine 25 MG tablet Commonly known as: ANTIVERT Take 25 mg by mouth 3 (three) times daily as needed for dizziness.   metoprolol succinate 50 MG 24 hr tablet Commonly known as: TOPROL-XL Take 1 tablet (50 mg total) by mouth daily.   multivitamin with minerals tablet Take 1 tablet by mouth daily.   nitroGLYCERIN 0.4 MG SL tablet Commonly known as: NITROSTAT Place 1 tablet under the tongue every 5 (five) minutes as needed for chest pain.   potassium chloride 10 MEQ tablet Commonly known as: KLOR-CON M Take 1 tablet (10 mEq total) by mouth daily. What changed:  medication strength how much to take when to take this   rosuvastatin 10 MG tablet Commonly known as: CRESTOR Take 1 tablet (10 mg total) by mouth daily.   sacubitril-valsartan 24-26 MG Commonly known as: ENTRESTO Take 1 tablet by mouth 2 (two) times daily.   spironolactone 25 MG tablet Commonly known as: ALDACTONE Take 1 tablet (25 mg total) by mouth daily. What changed: how much to take   torsemide 20 MG tablet Commonly known as: DEMADEX Take 1 tablet (20 mg total) by mouth daily. What changed:  how much to take when to take this   traZODone 50 MG tablet Commonly known as: DESYREL Take 25 mg by mouth at bedtime as needed for sleep.        Disposition   The patient will be discharged in stable condition to home.    Follow-up Information     [facility] Follow up on 03/03/2023.   Specialty: Cardiology Why: Follow up in the [facility] at [facility] 03/03/23 at 1:30 pm Entrance C, free valet Contact information: [address] [id number] [place]  Duration of Discharge Encounter: Greater than 35 minutes   Signed, Lyda Jester, PA-C  02/19/2023, 11:36 AM  Agree with above. [place] for d/c today. Arrange f/u in HF Clinic. Please see my rounding not for further details.   Glori Bickers, MD  2:17 PM

## 2023-02-19 ENCOUNTER — Encounter (HOSPITAL_COMMUNITY)
Admission: RE | Disposition: A | Discharge status: Home / Self Care | Payer: Self-pay | Source: Other Acute Inpatient Hospital | Attending: Cardiology

## 2023-02-19 ENCOUNTER — Inpatient Hospital Stay (HOSPITAL_COMMUNITY): Payer: PPO | Admitting: Certified Registered Nurse Anesthetist

## 2023-02-19 ENCOUNTER — Other Ambulatory Visit: Payer: Self-pay

## 2023-02-19 ENCOUNTER — Other Ambulatory Visit (HOSPITAL_COMMUNITY): Payer: Self-pay

## 2023-02-19 ENCOUNTER — Encounter (HOSPITAL_COMMUNITY): Payer: Self-pay | Admitting: Cardiology

## 2023-02-19 ENCOUNTER — Inpatient Hospital Stay (HOSPITAL_COMMUNITY): Payer: PPO

## 2023-02-19 DIAGNOSIS — I361 Nonrheumatic tricuspid (valve) insufficiency: Secondary | ICD-10-CM

## 2023-02-19 DIAGNOSIS — E119 Type 2 diabetes mellitus without complications: Secondary | ICD-10-CM

## 2023-02-19 DIAGNOSIS — I110 Hypertensive heart disease with heart failure: Secondary | ICD-10-CM | POA: Diagnosis not present

## 2023-02-19 DIAGNOSIS — I4891 Unspecified atrial fibrillation: Secondary | ICD-10-CM

## 2023-02-19 DIAGNOSIS — E039 Hypothyroidism, unspecified: Secondary | ICD-10-CM

## 2023-02-19 DIAGNOSIS — I081 Rheumatic disorders of both mitral and tricuspid valves: Secondary | ICD-10-CM | POA: Diagnosis not present

## 2023-02-19 DIAGNOSIS — I340 Nonrheumatic mitral (valve) insufficiency: Secondary | ICD-10-CM

## 2023-02-19 DIAGNOSIS — I509 Heart failure, unspecified: Secondary | ICD-10-CM

## 2023-02-19 HISTORY — PX: CARDIOVERSION: SHX1299

## 2023-02-19 HISTORY — PX: TEE WITHOUT CARDIOVERSION: SHX5443

## 2023-02-19 LAB — COOXEMETRY PANEL
Carboxyhemoglobin: 3 % — ABNORMAL HIGH (ref 0.5–1.5)
Methemoglobin: <0.7 % (ref 0.0–1.5)
O2 Saturation: 76.2 %
Total hemoglobin: 14 g/dL (ref 12.0–16.0)

## 2023-02-19 LAB — CBC
HCT: 43.1 % (ref 39.0–52.0)
Hemoglobin: 14.1 g/dL (ref 13.0–17.0)
MCH: 21.9 pg — ABNORMAL LOW (ref 26.0–34.0)
MCHC: 32.7 g/dL (ref 30.0–36.0)
MCV: 66.9 fL — ABNORMAL LOW (ref 80.0–100.0)
Platelets: 227 10*3/uL (ref 150–400)
RBC: 6.44 MIL/uL — ABNORMAL HIGH (ref 4.22–5.81)
RDW: 21.2 % — ABNORMAL HIGH (ref 11.5–15.5)
WBC: 12.4 10*3/uL — ABNORMAL HIGH (ref 4.0–10.5)
nRBC: 0 % (ref 0.0–0.2)

## 2023-02-19 LAB — GLUCOSE, CAPILLARY: Glucose-Capillary: 102 mg/dL — ABNORMAL HIGH (ref 70–99)

## 2023-02-19 LAB — BASIC METABOLIC PANEL
Anion gap: 11 (ref 5–15)
BUN: 32 mg/dL — ABNORMAL HIGH (ref 6–20)
CO2: 23 mmol/L (ref 22–32)
Calcium: 8.7 mg/dL — ABNORMAL LOW (ref 8.9–10.3)
Chloride: 98 mmol/L (ref 98–111)
Creatinine, Ser: 1.14 mg/dL (ref 0.61–1.24)
GFR, Estimated: >60 mL/min (ref >60)
Glucose, Bld: 225 mg/dL — ABNORMAL HIGH (ref 70–99)
Potassium: 4.1 mmol/L (ref 3.5–5.1)
Sodium: 132 mmol/L — ABNORMAL LOW (ref 135–145)

## 2023-02-19 LAB — PROTIME-INR
INR: 1.3 — ABNORMAL HIGH (ref 0.8–1.2)
Prothrombin Time: 15.9 seconds — ABNORMAL HIGH (ref 11.4–15.2)

## 2023-02-19 LAB — ECHO TEE

## 2023-02-19 SURGERY — ECHOCARDIOGRAM, TRANSESOPHAGEAL
Anesthesia: Monitor Anesthesia Care

## 2023-02-19 MED ORDER — SPIRONOLACTONE 25 MG PO TABS
25.0000 mg | ORAL_TABLET | Freq: Every day | ORAL | 5 refills | Status: DC
  Filled 2023-02-19: qty 30, 30d supply, fill #0

## 2023-02-19 MED ORDER — SACUBITRIL-VALSARTAN 24-26 MG PO TABS
1.0000 | ORAL_TABLET | Freq: Two times a day (BID) | ORAL | 5 refills | Status: DC
  Filled 2023-02-19: qty 60, 30d supply, fill #0

## 2023-02-19 MED ORDER — MIDAZOLAM HCL 2 MG/2ML IJ SOLN
INTRAMUSCULAR | Status: DC | PRN
  Administered 2023-02-19 (×2): 1 mg via INTRAVENOUS

## 2023-02-19 MED ORDER — EMPAGLIFLOZIN 10 MG PO TABS
10.0000 mg | ORAL_TABLET | Freq: Every day | ORAL | 5 refills | Status: DC
  Filled 2023-02-19: qty 30, 30d supply, fill #0

## 2023-02-19 MED ORDER — MIDAZOLAM HCL 2 MG/2ML IJ SOLN
INTRAMUSCULAR | Status: AC
  Filled 2023-02-19: qty 2

## 2023-02-19 MED ORDER — AMIODARONE HCL 200 MG PO TABS
400.0000 mg | ORAL_TABLET | Freq: Two times a day (BID) | ORAL | Status: DC
  Administered 2023-02-19: 400 mg via ORAL
  Filled 2023-02-19: qty 2

## 2023-02-19 MED ORDER — PROPOFOL 10 MG/ML IV BOLUS
INTRAVENOUS | Status: DC | PRN
  Administered 2023-02-19: 15 mg via INTRAVENOUS

## 2023-02-19 MED ORDER — AMIODARONE HCL 200 MG PO TABS: 200.0000 mg | ORAL_TABLET | Freq: Two times a day (BID) | ORAL | Status: DC

## 2023-02-19 MED ORDER — TORSEMIDE 20 MG PO TABS
20.0000 mg | ORAL_TABLET | Freq: Every day | ORAL | 5 refills | Status: DC
  Filled 2023-02-19: qty 30, 30d supply, fill #0

## 2023-02-19 MED ORDER — POTASSIUM CHLORIDE CRYS ER 10 MEQ PO TBCR
10.0000 meq | EXTENDED_RELEASE_TABLET | Freq: Every day | ORAL | 5 refills | Status: DC
  Filled 2023-02-19: qty 30, 30d supply, fill #0

## 2023-02-19 MED ORDER — ELIQUIS 5 MG PO TABS
5.0000 mg | ORAL_TABLET | Freq: Two times a day (BID) | ORAL | 5 refills | Status: DC
  Filled 2023-02-19: qty 60, 30d supply, fill #0

## 2023-02-19 MED ORDER — AMIODARONE HCL 200 MG PO TABS
ORAL_TABLET | ORAL | 1 refills | Status: DC
  Filled 2023-02-19: qty 60, 25d supply, fill #0

## 2023-02-19 MED ORDER — PROPOFOL 500 MG/50ML IV EMUL
INTRAVENOUS | Status: DC | PRN
  Administered 2023-02-19: 100 ug/kg/min via INTRAVENOUS

## 2023-02-19 MED ORDER — METOPROLOL SUCCINATE ER 50 MG PO TB24
50.0000 mg | ORAL_TABLET | Freq: Every day | ORAL | 5 refills | Status: DC
  Filled 2023-02-19: qty 30, 30d supply, fill #0

## 2023-02-19 MED ORDER — SODIUM CHLORIDE 0.9 % IV SOLN: INTRAVENOUS | Status: DC

## 2023-02-19 MED ORDER — INSULIN ASPART 100 UNIT/ML IJ SOLN: 0.0000 [IU] | Freq: Three times a day (TID) | INTRAMUSCULAR | Status: DC

## 2023-02-19 MED ORDER — TORSEMIDE 20 MG PO TABS
20.0000 mg | ORAL_TABLET | Freq: Every day | ORAL | Status: DC
  Administered 2023-02-19: 20 mg via ORAL
  Filled 2023-02-19: qty 1

## 2023-02-19 MED ORDER — ROSUVASTATIN CALCIUM 10 MG PO TABS
10.0000 mg | ORAL_TABLET | Freq: Every day | ORAL | 5 refills | Status: DC
  Filled 2023-02-19: qty 30, 30d supply, fill #0

## 2023-02-19 NOTE — H&P (View-Only) (Signed)
Advanced Heart Failure Rounding Note   Subjective:    Continues to diuresis, net negative 2.7L yesterday. Wt down another 3 lb (20 lb total). CVP 10.   Co-ox 76%. SCr 1.36>>1.14  K 4.1  Remains in Afib, on amio gtt at 30/hr.   Overall feels well. No dyspnea. LEE much improved. Gout pain improved.    RHC 3/18 RA = 15 RV = 41/18 PA =  44/23 (33)  PCW = 19 Fick cardiac output/index = 6.6/2.7 PVR = 2.2 WU Ao sat = 95%  PA sat = 70%, 69% RA sat = 70%  SVC sat = 65% PAPi = 1.4  1. 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 with elevated filling pressures    Objective:   Weight Range:  Vital Signs:   Temp:  [97.5 F (36.4 C)-98 F (36.7 C)] 97.6 F (36.4 C) (03/20 0420) Pulse Rate:  [87-98] 96 (03/20 0420) Resp:  [16-20] 18 (03/20 0600) BP: (94-120)/(69-91) 103/69 (03/20 0420) SpO2:  [90 %-97 %] 97 % (03/20 0420) Weight:  [125.1 kg] 125.1 kg (03/20 0638) Last BM Date : 02/18/23  Weight change: Filed Weights   02/17/23 0236 02/18/23 0356 02/19/23 [id number]  Weight: 127.5 kg 126.3 kg 125.1 kg    Intake/Output:   Intake/Output Summary (Last 24 hours) at 02/19/2023 0706 Last data filed at 02/19/2023 0700 Gross per 24 hour  Intake 1236.31 ml  Output 3800 ml  Net -2563.69 ml     PHYSICAL EXAM: CVP 10  General:  Well appearing, mod obese. No respiratory difficulty HEENT: normal Neck: supple. JVD 10 cm. Carotids 2+ bilat; no bruits. No lymphadenopathy or thyromegaly appreciated. Cor: PMI nondisplaced. Irregularly irregular rhythm and rate. No rubs, gallops or murmurs. Lungs: clear Abdomen: soft, nontender, nondistended. No hepatosplenomegaly. No bruits or masses. Good bowel sounds. Extremities: no cyanosis, clubbing, rash, edema + Unna boots, + RUE PICC  Neuro: alert & oriented x 3, cranial nerves grossly intact. moves all 4 extremities w/o difficulty. Affect pleasant.    Telemetry: AF 90s (Personally reviewed)    Labs: Basic Metabolic Panel: Recent Labs   Lab 02/15/23 0514 02/16/23 0525 02/17/23 0330 02/17/23 1322 02/17/23 1326 02/17/23 1330 02/17/23 1331 02/18/23 0410 02/19/23 0500  NA 138 135 129*   < > 136 137 136 131* 132*  K 3.2* 3.6 4.0   < > 4.1 3.9 4.0 3.2* 4.1  CL 98 94* 93*  --   --   --   --  88* 98  CO2 28 27 26   --   --   --   --  24 23  GLUCOSE 112* 120* 208*  --   --   --   --  356* 225*  BUN 9 17 24*  --   --   --   --  32* 32*  CREATININE 0.92 1.14 1.36*  --   --   --   --  1.29* 1.14  CALCIUM 8.9 8.9 9.1  --   --   --   --  8.7* 8.7*  MG  --  1.7 1.7  --   --   --   --  2.6*  --    < > = values in this interval not displayed.    Liver Function Tests: Recent Labs  Lab 02/14/23 1106  AST 31  ALT 15  ALKPHOS 54  BILITOT 1.1  PROT 7.2  ALBUMIN 3.5   No results for input(s): "LIPASE", "AMYLASE" in the last 168  hours. No results for input(s): "AMMONIA" in the last 168 hours.  CBC: Recent Labs  Lab 02/15/23 0514 02/16/23 0525 02/17/23 0330 02/17/23 1322 02/17/23 1326 02/17/23 1330 02/17/23 1331 02/18/23 0410 02/19/23 0500  WBC 5.2 5.1 8.2  --   --   --   --  12.5* 12.4*  HGB 12.6* 13.4 14.4   < > 17.7* 17.7* 18.0* 13.9 14.1  HCT 38.1* 41.2 43.7   < > 52.0 52.0 53.0* 42.5 43.1  MCV 66.7* 67.3* 66.2*  --   --   --   --  67.0* 66.9*  PLT 180 158 210  --   --   --   --  212 227   < > = values in this interval not displayed.   Cardiac Enzymes: No results for input(s): "CKTOTAL", "CKMB", "CKMBINDEX", "TROPONINI" in the last 168 hours.  BNP: BNP (last 3 results) Recent Labs    01/23/23 1710 02/14/23 1106  BNP 377.4* 248.5*    ProBNP (last 3 results) No results for input(s): "PROBNP" in the last 8760 hours.  Other results:  Imaging: CARDIAC CATHETERIZATION  Result Date: 02/17/2023 Findings: RA = 15 RV = 41/18 PA =  44/23 (33) PCW = 19 Fick cardiac output/index = 6.6/2.7 PVR = 2.2 WU Ao sat = 95% PA sat = 70%, 69% RA sat = 70% SVC sat = 65% PAPi = 1.4 Assessment: 1. Mild pulmonary HTN with  R>L heart failure with elevated filling pressures Plan/Discussion: Continue diuresis. Will need sleep study. Glori Bickers, MD 3:05 PM   Medications:   Scheduled Medications:  apixaban  5 mg Oral BID   Chlorhexidine Gluconate Cloth  6 each Topical Daily   empagliflozin  10 mg Oral Daily   fluticasone  2 spray Each Nare Daily   levothyroxine  50 mcg Oral Q0600   metoprolol succinate  50 mg Oral Daily   mupirocin ointment  1 Application Nasal BID   rosuvastatin  10 mg Oral Daily   sacubitril-valsartan  1 tablet Oral BID   sodium chloride flush  10-40 mL Intracatheter Q12H   spironolactone  25 mg Oral Daily    Infusions:  amiodarone 30 mg/hr (02/19/23 0700)    PRN Medications: albuterol, mouth rinse, sodium chloride flush, traZODone  Assessment/Plan:   1. 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 - Admitted from AHF clinic on 3/15 for volume overload. NYHA III-IIIb symptoms, ReDs 43%. - RHC 3/18: RA 15, PA 44/23 (33), CO 6.6, CI 2.8, PAPi 1.4. 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 Wt down 20 lb. CVP 10. SCr 1.14>1.36>1.29>1.14.  - Start PO torsemide 20 mg daily - Continue spiro 25 mg daily. - Continue entresto 24-26 mg BID - Continue Jardiance 10 mg daily - Continue Unna boots. - Strict I&O, daily weights   2. Persistent Atrial fibrillation with RVR - on-going since 2019 - TEE (9/18) moderate size thrombus in the LAA, no DCCV.   - TEE (4/19) no LAA thrombus, DCCV --> NSR - Admit 2/24 with AF with RVR  - on amiodarone 200 mg bid, unclear if he has been taking this consistently - Now on IV amio. Rates better controlled but still elevated - Continue Toprol XL 50 mg daily - Continue Eliquis 5 mg bid - Missed Eliquis doses last week - Given duration of  AF - not clear that he will be  able to maintain NSR but given difficulty with rate control and managing HF will attempt DC-CV with amio support. Plan TEE/DCCV today    3. 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  - Mild MR   4. Probable OHS/OSA - Needs sleep eval   5. DM2 - Controlled. - hgb A1C 6.1 - starting Jardiance 10 mg daily  6. 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 - Continue Eliquis, statin.   7. Obesity - Body mass index is 38.47 kg/m.   8. Hypokalemia - K 4.1 today  - Supp K as needed with diuresis  - Continue spiro  9. Low MCV - Tsat 7, ferritin 24. Feraheme prior to d/c.   10. H/o noncompliance - left AMA last admit   11. Acute gout in left hand - prednisone burst  - pain improved   12. AKI - SCr up to 1.36>1.29>1.14 with diuresis - baseline 0.9 - avoid hypotension   Length of Stay: 5  Brittainy Simmons PA-C  02/19/2023, 7:06 AM  Advanced Heart Failure Team Pager [phone] (M-F; 7a - 4p)  Please contact [facility] for night-coverage after hours (4p -7a ) and weekends on [email or web address]  Patient seen and examined with the above-signed Advanced Practice Provider and/or Housestaff. I personally reviewed laboratory data, imaging studies and relevant notes. I independently examined the patient and formulated the important aspects of the plan. I have edited the note to reflect any of my changes or salient points. I have personally discussed the plan with the patient and/or family.  Diuresing well. Scr stable. AF rate controlled on IV amio. Gout improved   General:  Well appearing. No resp difficulty HEENT: normal Neck: supple. JVP 6-7 Carotids 2+ bilat; no bruits. No lymphadenopathy or thryomegaly appreciated. Cor: PMI nondisplaced. Irregular rate & rhythm. No rubs, gallops or murmurs. Lungs: clear Abdomen: obese soft, nontender, nondistended. No hepatosplenomegaly.  No bruits or masses. Good bowel sounds. Extremities: no cyanosis, clubbing, rash, edema Neuro: alert & orientedx3, cranial nerves grossly intact. moves all 4 extremities w/o difficulty. Affect pleasant   For TEE/DCCV today. Will adjust meds to po. Hopefully home later today or tomorrow after DC-CV  Will need outpatient sleep study.   Glori Bickers, MD  9:21 AM

## 2023-02-19 NOTE — Progress Notes (Addendum)
Advanced Heart Failure Rounding Note   Subjective:    Continues to diuresis, net negative 2.7L yesterday. Wt down another 3 lb (20 lb total). CVP 10.   Co-ox 76%. SCr 1.36>>1.14  K 4.1  Remains in Afib, on amio gtt at 30/hr.   Overall feels well. No dyspnea. LEE much improved. Gout pain improved.    RHC 3/18 RA = 15 RV = 41/18 PA =  44/23 (33)  PCW = 19 Fick cardiac output/index = 6.6/2.7 PVR = 2.2 WU Ao sat = 95%  PA sat = 70%, 69% RA sat = 70%  SVC sat = 65% PAPi = 1.4  1. 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 with elevated filling pressures    Objective:   Weight Range:  Vital Signs:   Temp:  [97.5 F (36.4 C)-98 F (36.7 C)] 97.6 F (36.4 C) (03/20 0420) Pulse Rate:  [87-98] 96 (03/20 0420) Resp:  [16-20] 18 (03/20 0600) BP: (94-120)/(69-91) 103/69 (03/20 0420) SpO2:  [90 %-97 %] 97 % (03/20 0420) Weight:  [125.1 kg] 125.1 kg (03/20 0638) Last BM Date : 02/18/23  Weight change: Filed Weights   02/17/23 0236 02/18/23 0356 02/19/23 [id number]  Weight: 127.5 kg 126.3 kg 125.1 kg    Intake/Output:   Intake/Output Summary (Last 24 hours) at 02/19/2023 0706 Last data filed at 02/19/2023 0700 Gross per 24 hour  Intake 1236.31 ml  Output 3800 ml  Net -2563.69 ml     PHYSICAL EXAM: CVP 10  General:  Well appearing, mod obese. No respiratory difficulty HEENT: normal Neck: supple. JVD 10 cm. Carotids 2+ bilat; no bruits. No lymphadenopathy or thyromegaly appreciated. Cor: PMI nondisplaced. Irregularly irregular rhythm and rate. No rubs, gallops or murmurs. Lungs: clear Abdomen: soft, nontender, nondistended. No hepatosplenomegaly. No bruits or masses. Good bowel sounds. Extremities: no cyanosis, clubbing, rash, edema + Unna boots, + RUE PICC  Neuro: alert & oriented x 3, cranial nerves grossly intact. moves all 4 extremities w/o difficulty. Affect pleasant.    Telemetry: AF 90s (Personally reviewed)    Labs: Basic Metabolic Panel: Recent Labs   Lab 02/15/23 0514 02/16/23 0525 02/17/23 0330 02/17/23 1322 02/17/23 1326 02/17/23 1330 02/17/23 1331 02/18/23 0410 02/19/23 0500  NA 138 135 129*   < > 136 137 136 131* 132*  K 3.2* 3.6 4.0   < > 4.1 3.9 4.0 3.2* 4.1  CL 98 94* 93*  --   --   --   --  88* 98  CO2 28 27 26   --   --   --   --  24 23  GLUCOSE 112* 120* 208*  --   --   --   --  356* 225*  BUN 9 17 24*  --   --   --   --  32* 32*  CREATININE 0.92 1.14 1.36*  --   --   --   --  1.29* 1.14  CALCIUM 8.9 8.9 9.1  --   --   --   --  8.7* 8.7*  MG  --  1.7 1.7  --   --   --   --  2.6*  --    < > = values in this interval not displayed.    Liver Function Tests: Recent Labs  Lab 02/14/23 1106  AST 31  ALT 15  ALKPHOS 54  BILITOT 1.1  PROT 7.2  ALBUMIN 3.5   No results for input(s): "LIPASE", "AMYLASE" in the last 168  hours. No results for input(s): "AMMONIA" in the last 168 hours.  CBC: Recent Labs  Lab 02/15/23 0514 02/16/23 0525 02/17/23 0330 02/17/23 1322 02/17/23 1326 02/17/23 1330 02/17/23 1331 02/18/23 0410 02/19/23 0500  WBC 5.2 5.1 8.2  --   --   --   --  12.5* 12.4*  HGB 12.6* 13.4 14.4   < > 17.7* 17.7* 18.0* 13.9 14.1  HCT 38.1* 41.2 43.7   < > 52.0 52.0 53.0* 42.5 43.1  MCV 66.7* 67.3* 66.2*  --   --   --   --  67.0* 66.9*  PLT 180 158 210  --   --   --   --  212 227   < > = values in this interval not displayed.   Cardiac Enzymes: No results for input(s): "CKTOTAL", "CKMB", "CKMBINDEX", "TROPONINI" in the last 168 hours.  BNP: BNP (last 3 results) Recent Labs    01/23/23 1710 02/14/23 1106  BNP 377.4* 248.5*    ProBNP (last 3 results) No results for input(s): "PROBNP" in the last 8760 hours.  Other results:  Imaging: CARDIAC CATHETERIZATION  Result Date: 02/17/2023 Findings: RA = 15 RV = 41/18 PA =  44/23 (33) PCW = 19 Fick cardiac output/index = 6.6/2.7 PVR = 2.2 WU Ao sat = 95% PA sat = 70%, 69% RA sat = 70% SVC sat = 65% PAPi = 1.4 Assessment: 1. Mild pulmonary HTN with  R>L heart failure with elevated filling pressures Plan/Discussion: Continue diuresis. Will need sleep study. Glori Bickers, MD 3:05 PM   Medications:   Scheduled Medications:  apixaban  5 mg Oral BID   Chlorhexidine Gluconate Cloth  6 each Topical Daily   empagliflozin  10 mg Oral Daily   fluticasone  2 spray Each Nare Daily   levothyroxine  50 mcg Oral Q0600   metoprolol succinate  50 mg Oral Daily   mupirocin ointment  1 Application Nasal BID   rosuvastatin  10 mg Oral Daily   sacubitril-valsartan  1 tablet Oral BID   sodium chloride flush  10-40 mL Intracatheter Q12H   spironolactone  25 mg Oral Daily    Infusions:  amiodarone 30 mg/hr (02/19/23 0700)    PRN Medications: albuterol, mouth rinse, sodium chloride flush, traZODone  Assessment/Plan:   1. 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 - Admitted from AHF clinic on 3/15 for volume overload. NYHA III-IIIb symptoms, ReDs 43%. - RHC 3/18: RA 15, PA 44/23 (33), CO 6.6, CI 2.8, PAPi 1.4. 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 Wt down 20 lb. CVP 10. SCr 1.14>1.36>1.29>1.14.  - Start PO torsemide 20 mg daily - Continue spiro 25 mg daily. - Continue entresto 24-26 mg BID - Continue Jardiance 10 mg daily - Continue Unna boots. - Strict I&O, daily weights   2. Persistent Atrial fibrillation with RVR - on-going since 2019 - TEE (9/18) moderate size thrombus in the LAA, no DCCV.   - TEE (4/19) no LAA thrombus, DCCV --> NSR - Admit 2/24 with AF with RVR  - on amiodarone 200 mg bid, unclear if he has been taking this consistently - Now on IV amio. Rates better controlled but still elevated - Continue Toprol XL 50 mg daily - Continue Eliquis 5 mg bid - Missed Eliquis doses last week - Given duration of  AF - not clear that he will be  able to maintain NSR but given difficulty with rate control and managing HF will attempt DC-CV with amio support. Plan TEE/DCCV today    3. 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  - Mild MR   4. Probable OHS/OSA - Needs sleep eval   5. DM2 - Controlled. - hgb A1C 6.1 - starting Jardiance 10 mg daily  6. 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 - Continue Eliquis, statin.   7. Obesity - Body mass index is 38.47 kg/m.   8. Hypokalemia - K 4.1 today  - Supp K as needed with diuresis  - Continue spiro  9. Low MCV - Tsat 7, ferritin 24. Feraheme prior to d/c.   10. H/o noncompliance - left AMA last admit   11. Acute gout in left hand - prednisone burst  - pain improved   12. AKI - SCr up to 1.36>1.29>1.14 with diuresis - baseline 0.9 - avoid hypotension   Length of Stay: 5  Brittainy Simmons PA-C  02/19/2023, 7:06 AM  Advanced Heart Failure Team Pager [phone] (M-F; 7a - 4p)  Please contact [facility] for night-coverage after hours (4p -7a ) and weekends on [email or web address]  Patient seen and examined with the above-signed Advanced Practice Provider and/or Housestaff. I personally reviewed laboratory data, imaging studies and relevant notes. I independently examined the patient and formulated the important aspects of the plan. I have edited the note to reflect any of my changes or salient points. I have personally discussed the plan with the patient and/or family.  Diuresing well. Scr stable. AF rate controlled on IV amio. Gout improved   General:  Well appearing. No resp difficulty HEENT: normal Neck: supple. JVP 6-7 Carotids 2+ bilat; no bruits. No lymphadenopathy or thryomegaly appreciated. Cor: PMI nondisplaced. Irregular rate & rhythm. No rubs, gallops or murmurs. Lungs: clear Abdomen: obese soft, nontender, nondistended. No hepatosplenomegaly.  No bruits or masses. Good bowel sounds. Extremities: no cyanosis, clubbing, rash, edema Neuro: alert & orientedx3, cranial nerves grossly intact. moves all 4 extremities w/o difficulty. Affect pleasant   For TEE/DCCV today. Will adjust meds to po. Hopefully home later today or tomorrow after DC-CV  Will need outpatient sleep study.   Glori Bickers, MD  9:21 AM

## 2023-02-19 NOTE — CV Procedure (Signed)
    TRANSESOPHAGEAL ECHOCARDIOGRAM GUIDED DIRECT CURRENT CARDIOVERSION  NAME:  Parker [name]   MRN: [id number] DOB:  1966/08/03   ADMIT DATE: 02/14/2023  INDICATIONS:  AF  PROCEDURE:   Informed consent was obtained prior to the procedure. The risks, benefits and alternatives for the procedure were discussed and the patient comprehended these risks.  Risks include, but are not limited to, cough, sore throat, vomiting, nausea, somnolence, esophageal and stomach trauma or perforation, bleeding, low blood pressure, aspiration, pneumonia, infection, trauma to the teeth and death.    After a procedural time-out, the patient was sedated by the anesthesia service. Once an appropriate level of sedation was achieved, the transesophageal probe was inserted in the esophagus and stomach without difficulty and multiple views were obtained.    COMPLICATIONS:    There were no immediate complications.  FINDINGS:  LEFT VENTRICLE: EF = 55%. + LVH No regional wall motion abnormalities.  RIGHT VENTRICLE: Dilated. Severe HK.   LEFT ATRIUM: Moderately dilated  LEFT ATRIAL APPENDAGE: No thrombus.   RIGHT ATRIUM: Severely dilated  AORTIC VALVE:  Trileaflet. No AS/AI  MITRAL VALVE:    Normal. Mild to mod MR  TRICUSPID VALVE: Normal. Severe TR  PULMONIC VALVE: Grossly normal. Trivial PI  INTERATRIAL SEPTUM: No PFO or ASD.  PERICARDIUM: No effusion  DESCENDING AORTA: Moderate plaque   CARDIOVERSION:     Indications:  Atrial Fibrillation  Procedure Details:  Once the TEE was complete, the patient had the defibrillator pads placed in the anterior and posterior position. Once an appropriate level of sedation was achieved, the patient received a single biphasic, synchronized 200J shock with prompt conversion to sinus rhythm. No apparent complications.   Glori Bickers, MD  9:41 AM

## 2023-02-19 NOTE — Anesthesia Preprocedure Evaluation (Signed)
Anesthesia Evaluation  Patient identified by MRN, date of birth, ID band Patient awake    Reviewed: Allergy & Precautions, NPO status , Patient's Chart, lab work & pertinent test results  History of Anesthesia Complications Negative for: history of anesthetic complications  Airway Mallampati: III  TM Distance: >3 FB Neck ROM: Full    Dental  (+) Dental Advisory Given   Pulmonary shortness of breath, neg sleep apnea, neg COPD, neg recent URI   breath sounds clear to auscultation       Cardiovascular hypertension, Pt. on medications and Pt. on home beta blockers +CHF  + dysrhythmias Atrial Fibrillation  Rhythm:Irregular  1. Right ventricular systolic function is moderately reduced. The right  ventricular size is severely enlarged. There is moderately elevated  pulmonary artery systolic pressure. The estimated right ventricular  systolic pressure is [id number]. Unknown  chronicity of RV failure, but findings concerning for possible PE and  recommend further work-up if clinically indicated.   2. Left ventricular ejection fraction, by estimation, is 55 to 60%. The  left ventricle has normal function. The left ventricle has no regional  wall motion abnormalities. There is mild concentric left ventricular  hypertrophy. Left ventricular diastolic  parameters are indeterminate. There is the interventricular septum is  flattened in systole, consistent with right ventricular pressure overload.   3. Left atrial size was moderately dilated.   4. Right atrial size was moderately dilated.   5. The mitral valve is grossly normal. Mild mitral valve regurgitation.   6. Tricuspid valve regurgitation is moderate.   7. The aortic valve is tricuspid. There is mild calcification of the  aortic valve. There is mild thickening of the aortic valve. Aortic valve  regurgitation is not visualized. Aortic valve sclerosis/calcification is  present, without  any evidence of  aortic stenosis.   8. The inferior vena cava is dilated in size with <50% respiratory  variability, suggesting right atrial pressure of 15 mmHg.     Neuro/Psych negative neurological ROS  negative psych ROS   GI/Hepatic Neg liver ROS,GERD  ,,  Endo/Other  diabetesHypothyroidism    Renal/GU Renal diseaseLab Results      Component                Value               Date                      CREATININE               1.14                02/19/2023                Musculoskeletal  (+) Arthritis ,    Abdominal   Peds  Hematology  (+) Blood dyscrasia Lab Results      Component                Value               Date                      WBC                      12.4 (H)            02/19/2023  HGB                      14.1                02/19/2023                HCT                      43.1                02/19/2023                MCV                      66.9 (L)            02/19/2023                PLT                      227                 02/19/2023            eliquis   Anesthesia Other Findings   Reproductive/Obstetrics                              Anesthesia Physical Anesthesia Plan  ASA: 3  Anesthesia Plan: MAC and General   Post-op Pain Management: Minimal or no pain anticipated   Induction: Intravenous  PONV Risk Score and Plan: 2 and Propofol infusion and Treatment may vary due to age or medical condition  Airway Management Planned: Nasal Cannula and Natural Airway  Additional Equipment: None  Intra-op Plan:   Post-operative Plan:   Informed Consent: I have reviewed the patients History and Physical, chart, labs and discussed the procedure including the risks, benefits and alternatives for the proposed anesthesia with the patient or authorized representative who has indicated his/her understanding and acceptance.     Dental advisory given  Plan Discussed with: CRNA  Anesthesia Plan  Comments:          Anesthesia Quick Evaluation

## 2023-02-19 NOTE — Anesthesia Postprocedure Evaluation (Signed)
Anesthesia Post Note  Patient: CHASIN DONSBACH  Procedure(s) Performed: TRANSESOPHAGEAL ECHOCARDIOGRAM (TEE) CARDIOVERSION     Patient location during evaluation: Endoscopy Anesthesia Type: General Level of consciousness: awake and alert Pain management: pain level controlled Vital Signs Assessment: post-procedure vital signs reviewed and stable Respiratory status: spontaneous breathing, nonlabored ventilation, respiratory function stable and patient connected to nasal cannula oxygen Cardiovascular status: stable Postop Assessment: no apparent nausea or vomiting Anesthetic complications: no  No notable events documented.  Last Vitals:  Vitals:   02/19/23 1010 02/19/23 1028  BP: (!) 123/99 112/89  Pulse: 68   Resp: (!) 21   Temp:  (!) 36.4 C  SpO2: 97%     Last Pain:  Vitals:   02/19/23 1028  TempSrc: Oral  PainSc: 0-No pain                 Roslynn Holte

## 2023-02-19 NOTE — Progress Notes (Signed)
Patient given discharge instructions, all questions answered. Discharge medications given. Patient discharged to [facility] entrance by wheelchair.

## 2023-02-19 NOTE — Interval H&P Note (Signed)
History and Physical Interval Note:  02/19/2023 9:22 AM  Parker [name]  has presented today for surgery, with the diagnosis of AF.  The various methods of treatment have been discussed with the patient and family. After consideration of risks, benefits and other options for treatment, the patient has consented to  Procedure(s): TRANSESOPHAGEAL ECHOCARDIOGRAM (TEE) (N/A) CARDIOVERSION (N/A) as a surgical intervention.  The patient's history has been reviewed, patient examined, no change in status, stable for surgery.  I have reviewed the patient's chart and labs.  Questions were answered to the patient's satisfaction.     Rozella Servello

## 2023-02-19 NOTE — Plan of Care (Signed)
Problem: Education: Goal: Knowledge of General Education information will improve Description: Including pain rating scale, medication(s)/side effects and non-pharmacologic comfort measures 02/19/2023 1429 by Avelino Leeds, RN Outcome: Adequate for Discharge 02/19/2023 1428 by Avelino Leeds, RN Outcome: Adequate for Discharge   Problem: Health Behavior/Discharge Planning: Goal: Ability to manage health-related needs will improve 02/19/2023 1429 by Avelino Leeds, RN Outcome: Adequate for Discharge 02/19/2023 1428 by Avelino Leeds, RN Outcome: Adequate for Discharge   Problem: Clinical Measurements: Goal: Ability to maintain clinical measurements within normal limits will improve 02/19/2023 1429 by Avelino Leeds, RN Outcome: Adequate for Discharge 02/19/2023 1428 by Avelino Leeds, RN Outcome: Adequate for Discharge Goal: Will remain free from infection 02/19/2023 1429 by Avelino Leeds, RN Outcome: Adequate for Discharge 02/19/2023 1428 by Avelino Leeds, RN Outcome: Adequate for Discharge Goal: Diagnostic test results will improve 02/19/2023 1429 by Avelino Leeds, RN Outcome: Adequate for Discharge 02/19/2023 1428 by Avelino Leeds, RN Outcome: Adequate for Discharge Goal: Respiratory complications will improve 02/19/2023 1429 by Avelino Leeds, RN Outcome: Adequate for Discharge 02/19/2023 1428 by Avelino Leeds, RN Outcome: Adequate for Discharge Goal: Cardiovascular complication will be avoided 02/19/2023 1429 by Avelino Leeds, RN Outcome: Adequate for Discharge 02/19/2023 1428 by Avelino Leeds, RN Outcome: Adequate for Discharge   Problem: Activity: Goal: Risk for activity intolerance will decrease 02/19/2023 1429 by Avelino Leeds, RN Outcome: Adequate for Discharge 02/19/2023 1428 by Avelino Leeds, RN Outcome: Adequate for Discharge   Problem: Nutrition: Goal: Adequate  nutrition will be maintained 02/19/2023 1429 by Avelino Leeds, RN Outcome: Adequate for Discharge 02/19/2023 1428 by Avelino Leeds, RN Outcome: Adequate for Discharge   Problem: Coping: Goal: Level of anxiety will decrease 02/19/2023 1429 by Avelino Leeds, RN Outcome: Adequate for Discharge 02/19/2023 1428 by Avelino Leeds, RN Outcome: Adequate for Discharge   Problem: Elimination: Goal: Will not experience complications related to bowel motility 02/19/2023 1429 by Avelino Leeds, RN Outcome: Adequate for Discharge 02/19/2023 1428 by Avelino Leeds, RN Outcome: Adequate for Discharge Goal: Will not experience complications related to urinary retention 02/19/2023 1429 by Avelino Leeds, RN Outcome: Adequate for Discharge 02/19/2023 1428 by Avelino Leeds, RN Outcome: Adequate for Discharge   Problem: Pain Managment: Goal: General experience of comfort will improve 02/19/2023 1429 by Avelino Leeds, RN Outcome: Adequate for Discharge 02/19/2023 1428 by Avelino Leeds, RN Outcome: Adequate for Discharge   Problem: Safety: Goal: Ability to remain free from injury will improve 02/19/2023 1429 by Avelino Leeds, RN Outcome: Adequate for Discharge 02/19/2023 1428 by Avelino Leeds, RN Outcome: Adequate for Discharge   Problem: Skin Integrity: Goal: Risk for impaired skin integrity will decrease 02/19/2023 1429 by Avelino Leeds, RN Outcome: Adequate for Discharge 02/19/2023 1428 by Avelino Leeds, RN Outcome: Adequate for Discharge   Problem: Education: Goal: Understanding of CV disease, CV risk reduction, and recovery process will improve 02/19/2023 1429 by Avelino Leeds, RN Outcome: Adequate for Discharge 02/19/2023 1428 by Avelino Leeds, RN Outcome: Adequate for Discharge Goal: Individualized Educational Video(s) 02/19/2023 1429 by Avelino Leeds, RN Outcome: Adequate for  Discharge 02/19/2023 1428 by Avelino Leeds, RN Outcome: Adequate for Discharge   Problem: Activity: Goal: Ability to return to baseline activity level will improve 02/19/2023 1429 by Avelino Leeds, RN Outcome: Adequate for Discharge 02/19/2023 1428 by Avelino Leeds, RN Outcome: Adequate for Discharge  Problem: Health Behavior/Discharge Planning: Goal: Ability to safely manage health-related needs after discharge will improve 02/19/2023 1429 by Avelino Leeds, RN Outcome: Adequate for Discharge 02/19/2023 1428 by Avelino Leeds, RN Outcome: Adequate for Discharge   Problem: Education: Goal: Ability to describe self-care measures that may prevent or decrease complications (Diabetes Survival Skills Education) will improve 02/19/2023 1429 by Avelino Leeds, RN Outcome: Adequate for Discharge 02/19/2023 1428 by Avelino Leeds, RN Outcome: Adequate for Discharge Goal: Individualized Educational Video(s) 02/19/2023 1429 by Avelino Leeds, RN Outcome: Adequate for Discharge 02/19/2023 1428 by Avelino Leeds, RN Outcome: Adequate for Discharge   Problem: Coping: Goal: Ability to adjust to condition or change in health will improve 02/19/2023 1429 by Avelino Leeds, RN Outcome: Adequate for Discharge 02/19/2023 1428 by Avelino Leeds, RN Outcome: Adequate for Discharge   Problem: Fluid Volume: Goal: Ability to maintain a balanced intake and output will improve 02/19/2023 1429 by Avelino Leeds, RN Outcome: Adequate for Discharge 02/19/2023 1428 by Avelino Leeds, RN Outcome: Adequate for Discharge   Problem: Health Behavior/Discharge Planning: Goal: Ability to identify and utilize available resources and services will improve 02/19/2023 1429 by Avelino Leeds, RN Outcome: Adequate for Discharge 02/19/2023 1428 by Avelino Leeds, RN Outcome: Adequate for Discharge Goal: Ability to manage  health-related needs will improve 02/19/2023 1429 by Avelino Leeds, RN Outcome: Adequate for Discharge 02/19/2023 1428 by Avelino Leeds, RN Outcome: Adequate for Discharge   Problem: Metabolic: Goal: Ability to maintain appropriate glucose levels will improve 02/19/2023 1429 by Avelino Leeds, RN Outcome: Adequate for Discharge 02/19/2023 1428 by Avelino Leeds, RN Outcome: Adequate for Discharge   Problem: Nutritional: Goal: Maintenance of adequate nutrition will improve 02/19/2023 1429 by Avelino Leeds, RN Outcome: Adequate for Discharge 02/19/2023 1428 by Avelino Leeds, RN Outcome: Adequate for Discharge Goal: Progress toward achieving an optimal weight will improve 02/19/2023 1429 by Avelino Leeds, RN Outcome: Adequate for Discharge 02/19/2023 1428 by Avelino Leeds, RN Outcome: Adequate for Discharge   Problem: Skin Integrity: Goal: Risk for impaired skin integrity will decrease 02/19/2023 1429 by Avelino Leeds, RN Outcome: Adequate for Discharge 02/19/2023 1428 by Avelino Leeds, RN Outcome: Adequate for Discharge   Problem: Tissue Perfusion: Goal: Adequacy of tissue perfusion will improve 02/19/2023 1429 by Avelino Leeds, RN Outcome: Adequate for Discharge 02/19/2023 1428 by Avelino Leeds, RN Outcome: Adequate for Discharge

## 2023-02-19 NOTE — Transfer of Care (Signed)
Immediate Anesthesia Transfer of Care Note  Patient: Parker [name]  Procedure(s) Performed: TRANSESOPHAGEAL ECHOCARDIOGRAM (TEE) CARDIOVERSION  Patient Location: Endoscopy Unit  Anesthesia Type:General  Level of Consciousness: awake and drowsy  Airway & Oxygen Therapy: Patient Spontanous Breathing and Patient connected to nasal cannula oxygen  Post-op Assessment: Report given to RN and Post -op Vital signs reviewed and stable  Post vital signs: Reviewed and stable  Last Vitals:  Vitals Value Taken Time  BP 111/72 02/19/23 0950  Temp    Pulse 63 02/19/23 0952  Resp 17 02/19/23 0952  SpO2 96 % 02/19/23 0952  Vitals shown include unvalidated device data.  Last Pain:  Vitals:   02/19/23 0950  TempSrc:   PainSc: Asleep         Complications: No notable events documented.

## 2023-02-19 NOTE — TOC Transition Note (Signed)
Transition of Care [facility]) - CM/SW Discharge Note   Patient Details  Name: Parker [name] MRN: [id number] Date of Birth: 12-Jul-1966  Transition of Care [facility]) CM/SW Contact:  Cyndi Bender, RN Phone Number: 02/19/2023, 2:45 PM   Clinical Narrative:     Patient stable for discharge.  Patient requesting PCP apt be made in [place].  PCP apt made and placed on AVS Patient uses [facility] on [address]. Wife will transport home.  NO other TOC needs.   Final next level of care: Home/Self Care Barriers to Discharge: Barriers Resolved   Patient Goals and CMS Choice     Return home Discharge Placement                 home        Discharge Plan and Services Additional resources added to the After Visit Summary for     Discharge Planning Services: Follow-up appt scheduled                                 Social Determinants of Health (SDOH) Interventions SDOH Screenings   Food Insecurity: No Food Insecurity (02/19/2023)  Housing: Low Risk  (02/19/2023)  Transportation Needs: No Transportation Needs (02/19/2023)  Utilities: Not At Risk (02/19/2023)  Financial Resource Strain: Low Risk  (01/24/2023)  Tobacco Use: Low Risk  (02/19/2023)     Readmission Risk Interventions    02/19/2023    2:41 PM  Readmission Risk Prevention Plan  Transportation Screening Complete  PCP or Specialist Appt within 5-7 Days Complete  Home Care Screening Complete  Medication Review (RN CM) Complete

## 2023-02-20 ENCOUNTER — Encounter (HOSPITAL_COMMUNITY): Payer: Self-pay | Admitting: Internal Medicine

## 2023-02-28 NOTE — Progress Notes (Signed)
ADVANCED HF CLINIC CONSULT NOTE  Referring Physician: Pcp, No HF Cardiologist: Dr. Haroldine Laws  HPI: [name] is a 57 y.o.male with HFrEF, DM2, atrial fibrillation, obesity and hypothyroidism. Typically follows with [facility] cardiology. Has had multiple admissions in the past for PAF and CHF. Has been cardioverted in the past.    [name] was recently discharged from [facility] 2/24 with a fib RVR and a/c HFpEF. Diuresed well during admission however refused to stay longer and still had volume on him at d/c. Was referred to AHF TOC.    At [facility] appointment today he was overall feeling ok. However he was markedly volume overloaded and found to be in a fib RVR. Takes his meds daily, has recently missed eliquis dose. SOB with exertion, none at rest. Able to get around for the most part with no issues. He primary eats vegetables from his garden, trying to cut out sodium and protein. Unable to lay flat. Has noticed weight gain at home. Tests so far significant for BNP 248, SCr .83, K 3.3, EKG: a fib RVR.    RHC 3/18 RA = 15 RV = 41/18 PA =  44/23 (33)  PCW = 19 Fick cardiac output/index = 6.6/2.7 PVR = 2.2 WU Ao sat = 95%  PA sat = 70%, 69% RA sat = 70%  SVC sat = 65% PAPi = 1.4   1. 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 with elevated filling pressures Cardiac Testing  - Echo (2/24): EF 55%, mild LVH, severe RV enlargement with moderate RV systolic dysfunction.   - Echo at [facility] (8/23): EF 45-50%, mild LVH   - LHC (7/22):mild plaque in mid LAD, normal ramus, normal left circumflex, normal RCA. There was slightly sluggish flow, suspicious for microvascular dysfunction.    - Nuc stress test (2022):  abnormal with moderate inducible ischemia in the apical-mid inferior lateral wall segments.    - Echo (7/22): EF 55%, mild LVH, RV free wall was hypokinetic, mild MVR and TVR   - Echo (2/21): Echo EF > 65%, findings consistent with hypertrophic obstructive cardiomyopathy with subtle chordal SAM  with LVOT gradient of 72mmHg.   - Stress echo (2019) negative for inducible ischemia   - Echo (4/19): EF 62%   - TEE (4/19): no thrombus, s/p DCCV AFL --> NSR   - Echo (9/18):  EF 50-55%, normal RV systolic function, mildly dilated LA and RA.        Review of Systems: [y] = yes, [ ]  = no   General: Weight gain [ ] ; Weight loss [ ] ; Anorexia [ ] ; Fatigue [ ] ; Fever [ ] ; Chills [ ] ; Weakness [ ]   Cardiac: Chest pain/pressure [ ] ; Resting SOB [ ] ; Exertional SOB [ ] ; Orthopnea [ ] ; Pedal Edema [ ] ; Palpitations [ ] ; Syncope [ ] ; Presyncope [ ] ; Paroxysmal nocturnal dyspnea[ ]   Pulmonary: Cough [ ] ; Wheezing[ ] ; Hemoptysis[ ] ; Sputum [ ] ; Snoring [ ]   GI: Vomiting[ ] ; Dysphagia[ ] ; Melena[ ] ; Hematochezia [ ] ; Heartburn[ ] ; Abdominal pain [ ] ; Constipation [ ] ; Diarrhea [ ] ; BRBPR [ ]   GU: Hematuria[ ] ; Dysuria [ ] ; Nocturia[ ]   Vascular: Pain in legs with walking [ ] ; Pain in feet with lying flat [ ] ; Non-healing sores [ ] ; Stroke [ ] ; TIA [ ] ; Slurred speech [ ] ;  Neuro: Headaches[ ] ; Vertigo[ ] ; Seizures[ ] ; Paresthesias[ ] ;Blurred vision [ ] ; Diplopia [ ] ; Vision changes [ ]   Ortho/Skin: Arthritis [ ] ; Joint pain [ ] ;  Muscle pain [ ] ; Joint swelling [ ] ; Back Pain [ ] ; Rash [ ]   Psych: Depression[ ] ; Anxiety[ ]   Heme: Bleeding problems [ ] ; Clotting disorders [ ] ; Anemia [ ]   Endocrine: Diabetes [ ] ; Thyroid dysfunction[ ]    Past Medical History:  Diagnosis Date   (HFpEF) heart failure with preserved ejection fraction (HCC)    Arthritis    knees. elbow, ankle   Diabetes mellitus without complication (HCC)    Dyspnea    when in A-fib   Dysrhythmia    A-fib   GERD (gastroesophageal reflux disease)    takes Dexilant prn   Hypertension    takes Lisinopril-HCTZ daily   Muscle spasm    takes Relafen daily   Peripheral edema    Plantar fasciitis    right    Current Outpatient Medications  Medication Sig Dispense Refill   acetaminophen (TYLENOL) 500 MG tablet Take  500-1,000 mg by mouth every 8 (eight) hours as needed for moderate pain.     albuterol (VENTOLIN HFA) 108 (90 Base) MCG/ACT inhaler Inhale 2 puffs into the lungs every 6 (six) hours as needed for shortness of breath.     amiodarone (PACERONE) 200 MG tablet Take 2 tablets (400 mg total) by mouth 2 (two) times daily for 7 days, THEN 1 tablet (200 mg total) 2 (two) times daily for 14 days, THEN 1 tablet (200 mg total) daily. 60 tablet 1   colchicine 0.6 MG tablet Take 1 tablet (0.6 mg total) by mouth daily as needed (gout flares). (Patient not taking: Reported on 02/14/2023) 30 tablet 1   ELIQUIS 5 MG TABS tablet Take 1 tablet (5 mg total) by mouth 2 (two) times daily. 60 tablet 5   empagliflozin (JARDIANCE) 10 MG TABS tablet Take 1 tablet (10 mg total) by mouth daily. 30 tablet 5   fluticasone (FLONASE) 50 MCG/ACT nasal spray Place 2 sprays into both nostrils daily. (Patient taking differently: Place 2 sprays into both nostrils daily as needed for allergies.) 16 g 0   folic acid (FOLVITE) 1 MG tablet Take 1 mg by mouth daily as needed (low iron).     levothyroxine (SYNTHROID) 50 MCG tablet Take 50 mcg by mouth daily.     meclizine (ANTIVERT) 25 MG tablet Take 25 mg by mouth 3 (three) times daily as needed for dizziness.     metoprolol succinate (TOPROL-XL) 50 MG 24 hr tablet Take 1 tablet (50 mg total) by mouth daily. 30 tablet 5   Multiple Vitamins-Minerals (MULTIVITAMIN WITH MINERALS) tablet Take 1 tablet by mouth daily.     nitroGLYCERIN (NITROSTAT) 0.4 MG SL tablet Place 1 tablet under the tongue every 5 (five) minutes as needed for chest pain.     potassium chloride (KLOR-CON M) 10 MEQ tablet Take 1 tablet (10 mEq total) by mouth daily. 30 tablet 5   rosuvastatin (CRESTOR) 10 MG tablet Take 1 tablet (10 mg total) by mouth daily. 30 tablet 5   sacubitril-valsartan (ENTRESTO) 24-26 MG Take 1 tablet by mouth 2 (two) times daily. 60 tablet 5   spironolactone (ALDACTONE) 25 MG tablet Take 1 tablet (25  mg total) by mouth daily. 30 tablet 5   torsemide (DEMADEX) 20 MG tablet Take 1 tablet (20 mg total) by mouth daily. 30 tablet 5   traZODone (DESYREL) 50 MG tablet Take 25 mg by mouth at bedtime as needed for sleep.     No current facility-administered medications for this visit.    Allergies  Allergen  Reactions   Iodinated Contrast Media Itching   Other Rash    Other reaction(s): Other (See Comments) Neoprene: Rash      Social History   Socioeconomic History   Marital status: Single    Spouse name: Not on file   Number of children: Not on file   Years of education: Not on file   Highest education level: Not on file  Occupational History   Not on file  Tobacco Use   Smoking status: Never   Smokeless tobacco: Never  Vaping Use   Vaping Use: Never used  Substance and Sexual Activity   Alcohol use: Yes    Alcohol/week: 6.0 standard drinks of alcohol    Types: 6 Standard drinks or equivalent per week    Comment: occ   Drug use: No   Sexual activity: Yes  Other Topics Concern   Not on file  Social History Narrative   Not on file   Social Determinants of Health   Financial Resource Strain: Low Risk  (01/24/2023)   Overall Financial Resource Strain (CARDIA)    Difficulty of Paying Living Expenses: Not very hard  Food Insecurity: No Food Insecurity (02/19/2023)   Hunger Vital Sign    Worried About Running Out of Food in the Last Year: Never true    Ran Out of Food in the Last Year: Never true  Transportation Needs: No Transportation Needs (02/19/2023)   PRAPARE - Hydrologist (Medical): No    Lack of Transportation (Non-Medical): No  Physical Activity: Not on file  Stress: Not on file  Social Connections: Not on file  Intimate Partner Violence: Not At Risk (02/19/2023)   Humiliation, Afraid, Rape, and Kick questionnaire    Fear of Current or Ex-Partner: No    Emotionally Abused: No    Physically Abused: No    Sexually Abused: No      No family history on file.  There were no vitals filed for this visit.  PHYSICAL EXAM: General:  Well appearing. No respiratory difficulty HEENT: normal Neck: supple. no JVD. Carotids 2+ bilat; no bruits. No lymphadenopathy or thryomegaly appreciated. Cor: PMI nondisplaced. Regular rate & rhythm. No rubs, gallops or murmurs. Lungs: clear Abdomen: soft, nontender, nondistended. No hepatosplenomegaly. No bruits or masses. Good bowel sounds. Extremities: no cyanosis, clubbing, rash, edema Neuro: alert & oriented x 3, cranial nerves grossly intact. moves all 4 extremities w/o difficulty. Affect pleasant.  ECG:   ASSESSMENT & PLAN: 1. 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 - Admitted from AHF clinic on 3/15 for volume overload. NYHA III-IIIb symptoms, ReDs 43%. - RHC 3/18: RA 15, PA 44/23 (33), CO 6.6, CI 2.8, PAPi 1.4. 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 Wt down 20 lb. CVP 10. SCr 1.14>1.36>1.29>1.14.  - Start PO torsemide 20 mg daily - Continue spiro 25 mg daily. - Continue entresto 24-26 mg BID - Continue Jardiance 10 mg daily - Continue Unna boots. - Strict I&O, daily weights   2. Persistent Atrial fibrillation with RVR - on-going since 2019 - TEE (9/18) moderate size thrombus in the LAA, no DCCV.   - TEE (4/19) no LAA thrombus, DCCV --> NSR - Admit 2/24 with AF with RVR  - on amiodarone 200 mg bid, unclear if he has been taking this consistently -  Now on IV amio. Rates better controlled but still elevated - Continue Toprol XL 50 mg daily - Continue Eliquis 5 mg bid - Missed Eliquis doses last week - Given duration of AF - not clear that he will be able to maintain NSR but given difficulty with rate control and managing HF will attempt DC-CV with amio support. Plan TEE/DCCV today     3. 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  - Mild MR   4. Probable OHS/OSA - Needs sleep eval   5. DM2 - Controlled. - hgb A1C 6.1 - starting Jardiance 10 mg daily   6. 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 - Continue Eliquis, statin.   7. Obesity - Body mass index is 38.47 kg/m.   8. Hypokalemia - K 4.1 today  - Supp K as needed with diuresis  - Continue spiro   9. Low MCV - Tsat 7, ferritin 24. Feraheme prior to d/c.    10. H/o noncompliance - left AMA last admit   11. Acute gout in left hand - prednisone burst  - pain improved    12. AKI - SCr up to 1.36>1.29>1.14 with diuresis - baseline 0.9 - avoid hypotension

## 2023-03-03 ENCOUNTER — Encounter (HOSPITAL_COMMUNITY): Payer: Self-pay

## 2023-03-03 ENCOUNTER — Ambulatory Visit (HOSPITAL_COMMUNITY)
Admit: 2023-03-03 | Discharge: 2023-03-03 | Disposition: A | Discharge status: Home / Self Care | Payer: PPO | Source: Ambulatory Visit | Attending: Family Medicine | Admitting: Family Medicine

## 2023-03-03 ENCOUNTER — Other Ambulatory Visit (HOSPITAL_COMMUNITY): Payer: Self-pay

## 2023-03-03 ENCOUNTER — Telehealth (HOSPITAL_COMMUNITY): Payer: Self-pay

## 2023-03-03 VITALS — BP 138/82 | HR 85 | Wt 279.4 lb

## 2023-03-03 DIAGNOSIS — E119 Type 2 diabetes mellitus without complications: Secondary | ICD-10-CM

## 2023-03-03 DIAGNOSIS — I071 Rheumatic tricuspid insufficiency: Secondary | ICD-10-CM

## 2023-03-03 DIAGNOSIS — Z8673 Personal history of transient ischemic attack (TIA), and cerebral infarction without residual deficits: Secondary | ICD-10-CM | POA: Insufficient documentation

## 2023-03-03 DIAGNOSIS — Z7901 Long term (current) use of anticoagulants: Secondary | ICD-10-CM | POA: Insufficient documentation

## 2023-03-03 DIAGNOSIS — I272 Pulmonary hypertension, unspecified: Secondary | ICD-10-CM | POA: Diagnosis not present

## 2023-03-03 DIAGNOSIS — Z79899 Other long term (current) drug therapy: Secondary | ICD-10-CM | POA: Diagnosis not present

## 2023-03-03 DIAGNOSIS — Z6838 Body mass index (BMI) 38.0-38.9, adult: Secondary | ICD-10-CM | POA: Diagnosis not present

## 2023-03-03 DIAGNOSIS — Z7984 Long term (current) use of oral hypoglycemic drugs: Secondary | ICD-10-CM | POA: Diagnosis not present

## 2023-03-03 DIAGNOSIS — R609 Edema, unspecified: Secondary | ICD-10-CM | POA: Insufficient documentation

## 2023-03-03 DIAGNOSIS — I110 Hypertensive heart disease with heart failure: Secondary | ICD-10-CM | POA: Diagnosis not present

## 2023-03-03 DIAGNOSIS — E669 Obesity, unspecified: Secondary | ICD-10-CM | POA: Diagnosis not present

## 2023-03-03 DIAGNOSIS — M722 Plantar fascial fibromatosis: Secondary | ICD-10-CM | POA: Diagnosis not present

## 2023-03-03 DIAGNOSIS — R0683 Snoring: Secondary | ICD-10-CM | POA: Diagnosis not present

## 2023-03-03 DIAGNOSIS — I5032 Chronic diastolic (congestive) heart failure: Secondary | ICD-10-CM

## 2023-03-03 DIAGNOSIS — I4819 Other persistent atrial fibrillation: Secondary | ICD-10-CM | POA: Diagnosis not present

## 2023-03-03 DIAGNOSIS — E039 Hypothyroidism, unspecified: Secondary | ICD-10-CM | POA: Insufficient documentation

## 2023-03-03 DIAGNOSIS — R718 Other abnormality of red blood cells: Secondary | ICD-10-CM

## 2023-03-03 DIAGNOSIS — I639 Cerebral infarction, unspecified: Secondary | ICD-10-CM

## 2023-03-03 LAB — BASIC METABOLIC PANEL
Anion gap: 13 (ref 5–15)
BUN: 11 mg/dL (ref 6–20)
CO2: 23 mmol/L (ref 22–32)
Calcium: 9 mg/dL (ref 8.9–10.3)
Chloride: 102 mmol/L (ref 98–111)
Creatinine, Ser: 0.97 mg/dL (ref 0.61–1.24)
GFR, Estimated: >60 mL/min (ref >60)
Glucose, Bld: 108 mg/dL — ABNORMAL HIGH (ref 70–99)
Potassium: 3.6 mmol/L (ref 3.5–5.1)
Sodium: 138 mmol/L (ref 135–145)

## 2023-03-03 LAB — CBC
HCT: 46.8 % (ref 39.0–52.0)
Hemoglobin: 15.4 g/dL (ref 13.0–17.0)
MCH: 22 pg — ABNORMAL LOW (ref 26.0–34.0)
MCHC: 32.9 g/dL (ref 30.0–36.0)
MCV: 66.8 fL — ABNORMAL LOW (ref 80.0–100.0)
Platelets: 225 10*3/uL (ref 150–400)
RBC: 7.01 MIL/uL — ABNORMAL HIGH (ref 4.22–5.81)
RDW: 22.4 % — ABNORMAL HIGH (ref 11.5–15.5)
WBC: 5.6 10*3/uL (ref 4.0–10.5)
nRBC: 0 % (ref 0.0–0.2)

## 2023-03-03 LAB — BRAIN NATRIURETIC PEPTIDE: B Natriuretic Peptide: 98.1 pg/mL (ref 0.0–100.0)

## 2023-03-03 MED ORDER — ENTRESTO 49-51 MG PO TABS: 1.0000 | ORAL_TABLET | Freq: Two times a day (BID) | ORAL | 8 refills | Status: DC

## 2023-03-03 NOTE — Telephone Encounter (Signed)
Called patient to let him know no pre cert needed for Parker [name]

## 2023-03-03 NOTE — Patient Instructions (Addendum)
Thank you for coming in today  Labs were done today, if any labs are abnormal the clinic will call you No news is good news   You were given a home sleep study, and will be contacted on when to complete it.  Medications: INCREASE Entresto to 49/51 mg 1 tablet twice daily   Follow up appointments:  Your physician recommends that you schedule a follow-up appointment in:  3 months with Dr. Haroldine Laws  You will receive a reminder letter in the mail a few months in advance. If you don't receive a letter, please call our office to schedule the follow-up appointment.     Do the following things EVERYDAY: Weigh yourself in the morning before breakfast. Write it down and keep it in a log. Take your medicines as prescribed Eat low salt foods--Limit salt (sodium) to 2000 mg per day.  Stay as active as you can everyday Limit all fluids for the day to less than 2 liters   At the [facility], you and your health needs are our priority. As part of our continuing mission to provide you with exceptional heart care, we have created designated Provider Care Teams. These Care Teams include your primary Cardiologist (physician) and Advanced Practice Providers (APPs- Physician Assistants and Nurse Practitioners) who all work together to provide you with the care you need, when you need it.   You may see any of the following providers on your designated Care Team at your next follow up: Dr Glori Bickers Dr Loralie Champagne Dr. Roxana Hires, NP Lyda Jester, [place] [facility] [place], [place] Forestine Na, NP Audry Riles, PharmD   Please be sure to bring in all your medications bottles to every appointment.    Thank you for choosing [facility]  If you have any questions or concerns before your next appointment please send [place] a message through [place] or call our office at [phone].    TO LEAVE A MESSAGE FOR  THE NURSE SELECT OPTION 2, PLEASE LEAVE A MESSAGE INCLUDING: YOUR NAME DATE OF BIRTH CALL BACK NUMBER REASON FOR CALL**this is important as we prioritize the call backs  YOU WILL RECEIVE A CALL BACK THE SAME DAY AS LONG AS YOU CALL BEFORE 4:00 PM

## 2023-03-03 NOTE — Progress Notes (Addendum)
Patient Name: Parker [name]        DOB: [birth year]      Height: 31ft 11in    Weight: 279 lbs  Office Name: [facility]         Referring Provider: Allena Katz NP  Today's Date: 03/03/2023  Date:   STOP BANG RISK ASSESSMENT S (snore) Have you been told that you snore?     YES   T (tired) Are you often tired, fatigued, or sleepy during the day?   YES  O (obstruction) Do you stop breathing, choke, or gasp during sleep? NO   P (pressure) Do you have or are you being treated for high blood pressure? YES   B (BMI) Is your body index greater than 35 kg/m? YES   A (age) Are you 58 years old or older? YES   N (neck) Do you have a neck circumference greater than 16 inches?   NO   G (gender) Are you a male? YES  TOTAL STOP/BANG "YES" ANSWERS 6                                                                       For Office Use Only              Procedure Order Form    YES to 3+ Stop Bang questions OR two clinical symptoms - patient qualifies for WatchPAT (CPT 95800)             Clinical Notes: Will consult Sleep Specialist and refer for management of therapy due to patient increased risk of Sleep Apnea. Ordering a sleep study due to the following two clinical symptoms: Excessive daytime sleepiness G47.10 / Gastroesophageal reflux K21.9 / Nocturia R35.1 / Morning Headaches G44.221 / Difficulty concentrating R41.840 / Memory problems or poor judgment G31.84 / Personality changes or irritability R45.4 / Loud snoring R06.83 / Depression F32.9 / Unrefreshed by sleep G47.8 / Impotence N52.9 / History of high blood pressure R03.0 / Insomnia G47.00    I understand that I am proceeding with a home sleep apnea test as ordered by my treating physician. I understand that untreated sleep apnea is a serious cardiovascular risk factor and it is my responsibility to perform the test and seek management for sleep apnea. I will be contacted with the results and be managed for sleep apnea by  a local sleep physician. I will be receiving equipment and further instructions from [facility]. I shall promptly ship back the equipment via the included mailing label. I understand my insurance will be billed for the test and as the patient I am responsible for any insurance related out-of-pocket costs incurred. I have been provided with written instructions and can call for additional video or telephonic instruction, with 24-hour availability of qualified personnel to answer any questions: Patient Help Desk [phone].  Patient Signature ______________________________________________________   Date______________________ Patient Telemedicine Verbal Consent

## 2023-03-04 ENCOUNTER — Telehealth: Payer: Self-pay | Admitting: *Deleted

## 2023-03-04 NOTE — Telephone Encounter (Signed)
   Patient Name: Parker [name]  DOB: [birth year] MRN: [id number]  Primary Cardiologist: None  Chart reviewed as part of pre-operative protocol coverage. Cataract extractions are recognized in guidelines as low risk surgeries that do not typically require specific preoperative testing or holding of blood thinner therapy. Therefore, given past medical history and time since last visit, based on ACC/AHA guidelines, Parker [name] would be at acceptable risk for the planned procedure without further cardiovascular testing.   I will route this recommendation to the requesting party via Epic fax function and remove from pre-op pool.  Please call with questions.  Mable Fill, Marissa Nestle, NP 03/04/2023, 9:11 AM

## 2023-03-04 NOTE — Telephone Encounter (Signed)
   Pre-operative Risk Assessment    Patient Name: Parker [name]  DOB: 02-15-1966 MRN: [id number]      Request for Surgical Clearance    Procedure:   Cataract Extraction by PE,IOL  Date of Surgery:  Clearance 03/14/23                                 Surgeon: Dr. Tama High Surgeon's Group or Practice Name:    [facility] Phone number:  [phone] Fax number:  [phone]   Type of Clearance Requested:   - Medical  - Pharmacy:  Hold Apixaban (Eliquis) Not Indicated.   Type of Anesthesia:   IV sedation   Additional requests/questions:    Signed, Greer Ee   03/04/2023, 9:05 AM

## 2023-03-07 ENCOUNTER — Other Ambulatory Visit: Payer: Self-pay | Admitting: Internal Medicine

## 2023-03-07 ENCOUNTER — Ambulatory Visit (INDEPENDENT_AMBULATORY_CARE_PROVIDER_SITE_OTHER): Payer: PPO | Admitting: Internal Medicine

## 2023-03-07 ENCOUNTER — Encounter: Payer: Self-pay | Admitting: Internal Medicine

## 2023-03-07 VITALS — BP 140/92 | HR 79 | Temp 97.5°F | Ht 71.0 in | Wt 280.4 lb

## 2023-03-07 DIAGNOSIS — Z1211 Encounter for screening for malignant neoplasm of colon: Secondary | ICD-10-CM

## 2023-03-07 DIAGNOSIS — I10 Essential (primary) hypertension: Secondary | ICD-10-CM

## 2023-03-07 DIAGNOSIS — T50905A Adverse effect of unspecified drugs, medicaments and biological substances, initial encounter: Secondary | ICD-10-CM

## 2023-03-07 DIAGNOSIS — N529 Male erectile dysfunction, unspecified: Secondary | ICD-10-CM

## 2023-03-07 DIAGNOSIS — R42 Dizziness and giddiness: Secondary | ICD-10-CM

## 2023-03-07 DIAGNOSIS — E1142 Type 2 diabetes mellitus with diabetic polyneuropathy: Secondary | ICD-10-CM

## 2023-03-07 DIAGNOSIS — I50813 Acute on chronic right heart failure: Secondary | ICD-10-CM | POA: Diagnosis not present

## 2023-03-07 DIAGNOSIS — E876 Hypokalemia: Secondary | ICD-10-CM

## 2023-03-07 DIAGNOSIS — S37009A Unspecified injury of unspecified kidney, initial encounter: Secondary | ICD-10-CM | POA: Insufficient documentation

## 2023-03-07 DIAGNOSIS — R0683 Snoring: Secondary | ICD-10-CM

## 2023-03-07 DIAGNOSIS — S37009D Unspecified injury of unspecified kidney, subsequent encounter: Secondary | ICD-10-CM

## 2023-03-07 DIAGNOSIS — E039 Hypothyroidism, unspecified: Secondary | ICD-10-CM

## 2023-03-07 DIAGNOSIS — Z8673 Personal history of transient ischemic attack (TIA), and cerebral infarction without residual deficits: Secondary | ICD-10-CM

## 2023-03-07 DIAGNOSIS — M109 Gout, unspecified: Secondary | ICD-10-CM

## 2023-03-07 DIAGNOSIS — Z23 Encounter for immunization: Secondary | ICD-10-CM

## 2023-03-07 DIAGNOSIS — R718 Other abnormality of red blood cells: Secondary | ICD-10-CM

## 2023-03-07 DIAGNOSIS — N179 Acute kidney failure, unspecified: Secondary | ICD-10-CM

## 2023-03-07 DIAGNOSIS — G4733 Obstructive sleep apnea (adult) (pediatric): Secondary | ICD-10-CM

## 2023-03-07 DIAGNOSIS — I4891 Unspecified atrial fibrillation: Secondary | ICD-10-CM

## 2023-03-07 DIAGNOSIS — E785 Hyperlipidemia, unspecified: Secondary | ICD-10-CM

## 2023-03-07 DIAGNOSIS — Z9189 Other specified personal risk factors, not elsewhere classified: Secondary | ICD-10-CM

## 2023-03-07 HISTORY — DX: Obstructive sleep apnea (adult) (pediatric): G47.33

## 2023-03-07 HISTORY — DX: Unspecified injury of unspecified kidney, initial encounter: S37.009A

## 2023-03-07 LAB — MICROALBUMIN / CREATININE URINE RATIO
Creatinine,U: 15.1 mg/dL
Microalb Creat Ratio: 4.6 mg/g (ref 0.0–30.0)
Microalb, Ur: <0.7 mg/dL (ref 0.0–1.9)

## 2023-03-07 MED ORDER — OZEMPIC (0.25 OR 0.5 MG/DOSE) 2 MG/3ML ~~LOC~~ SOPN: 0.2500 mg | PEN_INJECTOR | SUBCUTANEOUS | 0 refills | Status: DC

## 2023-03-07 MED ORDER — RIVAROXABAN 20 MG PO TABS: 20.0000 mg | ORAL_TABLET | Freq: Every day | ORAL | 3 refills | Status: AC

## 2023-03-07 MED ORDER — GABAPENTIN 300 MG PO CAPS: ORAL_CAPSULE | ORAL | 3 refills | Status: DC

## 2023-03-07 MED ORDER — EMPAGLIFLOZIN 10 MG PO TABS: 10.0000 mg | ORAL_TABLET | Freq: Every day | ORAL | 3 refills | Status: DC

## 2023-03-07 MED ORDER — TRAZODONE HCL 50 MG PO TABS: 25.0000 mg | ORAL_TABLET | Freq: Every evening | ORAL | 3 refills | Status: DC | PRN

## 2023-03-07 MED ORDER — METOPROLOL SUCCINATE ER 50 MG PO TB24: 50.0000 mg | ORAL_TABLET | Freq: Every day | ORAL | 5 refills | Status: DC

## 2023-03-07 MED ORDER — TORSEMIDE 20 MG PO TABS: 20.0000 mg | ORAL_TABLET | Freq: Every day | ORAL | 5 refills | Status: DC

## 2023-03-07 MED ORDER — ENTRESTO 49-51 MG PO TABS: 1.0000 | ORAL_TABLET | Freq: Two times a day (BID) | ORAL | 8 refills | Status: DC

## 2023-03-07 MED ORDER — SPIRONOLACTONE 25 MG PO TABS: 25.0000 mg | ORAL_TABLET | Freq: Every day | ORAL | 5 refills | Status: DC

## 2023-03-07 MED ORDER — ROSUVASTATIN CALCIUM 20 MG PO TABS: 20.0000 mg | ORAL_TABLET | Freq: Every day | ORAL | 3 refills | Status: DC

## 2023-03-07 MED ORDER — TADALAFIL 20 MG PO TABS: 10.0000 mg | ORAL_TABLET | ORAL | 3 refills | Status: DC | PRN

## 2023-03-07 MED ORDER — ALLOPURINOL 300 MG PO TABS: 300.0000 mg | ORAL_TABLET | Freq: Every day | ORAL | 3 refills | Status: DC

## 2023-03-07 MED ORDER — PREDNISONE 20 MG PO TABS: ORAL_TABLET | ORAL | 0 refills | Status: DC

## 2023-03-07 NOTE — Patient Instructions (Signed)
To wife: Gout, a painful form of arthritis, is triggered by elevated levels of uric acid in the body. Alcohol consumption can exacerbate gout symptoms due to its impact on uric acid levels. Here's what you need to know:  Effects of Alcohol on Gout: Purines, substances found in certain foods and drinks, can be converted into uric acid. Alcohol contributes to gout flares through several mechanisms: Alcoholic beverages contain purines (especially high in beer). Alcohol increases the breakdown of nucleotides, another source of purines that can be converted into additional uric acid. Alcohol affects the rate of uric acid excretion, further raising uric acid levels. These excess uric acid crystals often lodge in joints, particularly the big toe, leading to gout flares. Quantity Matters: High alcohol consumption is an independent risk factor for developing gout. Once a person has gout, any amount of alcohol increases the risk of a flare. Dietary recommendations for people with gout include avoiding alcohol for at least three days per week. A study found that even one to two alcoholic beverages within 24 hours increased the risk of a gout flare1. Safest Alcoholic Drinks for Gout: Opt for alcoholic beverages that are low in purines: Vodka Whiskey Gin Consume these with caution. Avoid red and fortified wines, as well as dark ales and lagers, which are higher in purines2. General Advice: During a gout flare-up, avoid all alcohol. Aim to drink 8-16 cups of nonalcoholic fluids daily, with at least half being water. This helps flush out excess uric acid and prevents conditions like kidney stones3. Remember, moderation and awareness are key when it comes to alcohol and gout management. Always consult with a healthcare professional for personalized advice4.

## 2023-03-07 NOTE — Assessment & Plan Note (Signed)
He wants to go homeopathic, encouraged patient to to continue with levothyroxine

## 2023-03-08 ENCOUNTER — Other Ambulatory Visit: Payer: Self-pay | Admitting: Internal Medicine

## 2023-03-08 ENCOUNTER — Encounter (INDEPENDENT_AMBULATORY_CARE_PROVIDER_SITE_OTHER): Payer: PPO | Admitting: Cardiology

## 2023-03-08 DIAGNOSIS — G4733 Obstructive sleep apnea (adult) (pediatric): Secondary | ICD-10-CM

## 2023-03-08 DIAGNOSIS — E1142 Type 2 diabetes mellitus with diabetic polyneuropathy: Secondary | ICD-10-CM

## 2023-03-09 ENCOUNTER — Other Ambulatory Visit: Payer: Self-pay | Admitting: Internal Medicine

## 2023-03-09 ENCOUNTER — Ambulatory Visit: Payer: PPO | Attending: Internal Medicine

## 2023-03-09 DIAGNOSIS — E876 Hypokalemia: Secondary | ICD-10-CM | POA: Insufficient documentation

## 2023-03-09 DIAGNOSIS — G4733 Obstructive sleep apnea (adult) (pediatric): Secondary | ICD-10-CM

## 2023-03-09 DIAGNOSIS — Z9189 Other specified personal risk factors, not elsewhere classified: Secondary | ICD-10-CM | POA: Insufficient documentation

## 2023-03-09 DIAGNOSIS — E785 Hyperlipidemia, unspecified: Secondary | ICD-10-CM | POA: Insufficient documentation

## 2023-03-09 DIAGNOSIS — Z8673 Personal history of transient ischemic attack (TIA), and cerebral infarction without residual deficits: Secondary | ICD-10-CM | POA: Insufficient documentation

## 2023-03-09 DIAGNOSIS — N529 Male erectile dysfunction, unspecified: Secondary | ICD-10-CM | POA: Insufficient documentation

## 2023-03-09 DIAGNOSIS — E1142 Type 2 diabetes mellitus with diabetic polyneuropathy: Secondary | ICD-10-CM

## 2023-03-09 MED ORDER — TORSEMIDE 20 MG PO TABS: 20.0000 mg | ORAL_TABLET | Freq: Every day | ORAL | 3 refills | Status: DC

## 2023-03-09 MED ORDER — LEVOTHYROXINE SODIUM 50 MCG PO TABS: 50.0000 ug | ORAL_TABLET | Freq: Every day | ORAL | 3 refills | Status: AC

## 2023-03-09 MED ORDER — POTASSIUM CHLORIDE ER 10 MEQ PO TBCR: 10.0000 meq | EXTENDED_RELEASE_TABLET | Freq: Every day | ORAL | 3 refills | Status: DC

## 2023-03-09 MED ORDER — SPIRONOLACTONE 25 MG PO TABS: 25.0000 mg | ORAL_TABLET | Freq: Every day | ORAL | 3 refills | Status: DC

## 2023-03-09 MED ORDER — METOPROLOL SUCCINATE ER 50 MG PO TB24: 50.0000 mg | ORAL_TABLET | Freq: Every day | ORAL | 3 refills | Status: AC

## 2023-03-09 MED ORDER — ENTRESTO 49-51 MG PO TABS: 1.0000 | ORAL_TABLET | Freq: Two times a day (BID) | ORAL | 8 refills | Status: DC

## 2023-03-09 NOTE — Procedures (Signed)
SLEEP STUDY REPORT Patient Information Study Date: 03/08/2023 Patient Name: Parker [name] Patient ID: [id number] Birth Date: 03-22-1966 Age: 57 Gender: Male BMI: 39.2 (W=280 lb, H=5' 11'') Referring Physician: Nicholes Mango, MD  TEST DESCRIPTION:  Home sleep apnea testing was completed using the WatchPat, a Type 1 device, utilizing peripheral arterial tonometry (PAT), chest movement, actigraphy, pulse oximetry, pulse rate, body position and snore.  AHI was calculated with apnea and hypopnea using valid sleep time as the denominator. RDI includes apneas, hypopneas, and RERAs.  The data acquired and the scoring of sleep and all associated events were performed in accordance with the recommended standards and specifications as outlined in the AASM Manual for the Scoring of Sleep and Associated Events 2.2.0 (2015).  FINDINGS:  1.  Moderate Obstructive Sleep Apnea with AHI 26.7/hr.   2.  No Central Sleep Apnea with pAHIc 2.3/hr.  3.  Oxygen desaturations as low as 75%.  4.  Severe snoring was present. O2 sats were < 88% for 68.7 min.  5.  Total sleep time was 7 hrs and 15 min.  6.  30.9% of total sleep time was spent in REM sleep.   7.  Prolonged sleep onset latency at 45 min  8.  Shortened REM sleep onset latency at 59 min.   9.  Total awakenings were 17.  10. Arrhythmia detection: None  DIAGNOSIS:   Moderate Obstructive Sleep Apnea (G47.33) Nocturnal Hypoxemia  RECOMMENDATIONS:   1.  Clinical correlation of these findings is necessary.  The decision to treat obstructive sleep apnea (OSA) is usually based on the presence of apnea symptoms or the presence of associated medical conditions such as Hypertension, Congestive Heart Failure, Atrial Fibrillation or Obesity.  The most common symptoms of OSA are snoring, gasping for breath while sleeping, daytime sleepiness and fatigue.   2.  Initiating apnea therapy is recommended given the presence of symptoms and/or associated conditions.  Recommend proceeding with one of the following:     a.  Auto-CPAP therapy with a pressure range of 5-20cm H2O.     b.  An oral appliance (OA) that can be obtained from certain dentists with expertise in sleep medicine.  These are primarily of use in non-obese patients with mild and moderate disease.     c.  An ENT consultation which may be useful to look for specific causes of obstruction and possible treatment options.     d.  If patient is intolerant to PAP therapy, consider referral to ENT for evaluation for hypoglossal nerve stimulator.   3.  Close follow-up is necessary to ensure success with CPAP or oral appliance therapy for maximum benefit.  4.  A follow-up oximetry study on CPAP is recommended to assess the adequacy of therapy and determine the need for supplemental oxygen or the potential need for Bi-level therapy.  An arterial blood gas to determine the adequacy of baseline ventilation and oxygenation should also be considered.  5.  Healthy sleep recommendations include:  adequate nightly sleep (normal 7-9 hrs/night), avoidance of caffeine after noon and alcohol near bedtime, and maintaining a sleep environment that is cool, dark and quiet.  6.  Weight loss for overweight patients is recommended.  Even modest amounts of weight loss can significantly improve the severity of sleep apnea.  7.  Snoring recommendations include:  weight loss where appropriate, side sleeping, and avoidance of alcohol before bed.  8.  Operation of motor vehicle should not be performed when sleepy.  Signature: Armanda Magic, MD;  [facility]; Diplomat, American Board of Sleep Medicine Electronically Signed: 03/09/2023 9:03:10 PM

## 2023-03-09 NOTE — Assessment & Plan Note (Signed)
Great effort was given to attempting comprehensive evaluation and medication reconciliation - but encouraged Parker [name]  to to do frequent follow up

## 2023-03-09 NOTE — Progress Notes (Signed)
[facility]  Phone: [phone]  New patient visit  Visit Date: 03/07/2023 Patient: Parker [name]   DOB: 03-Jul-1966   57 y.o. Male  MRN: [id number] PCP:  Lula Olszewskiyan G Lj Miyamoto, MD  (establishing care today)  Today's Health Care Provider: Lula Olszewskiyan G Marny Smethers, MD   Assessment and Plan:   This initial visit focused on establishing a primary care relationship and gathering a comprehensive medical history. We addressed key concerns and prioritized next steps. For any remaining concerns, we encourage Parker [name] to schedule a follow-up appointment at his earliest convenience.   To further complete the medical history and optimize health maintenance, we also recommend scheduling an Annual Comprehensive Physical Exam (CPE) also known as Preventive Medicine Visit.  Complex patient.  Today was a monumental task just going through and updating all his problems and making sure all his medications are filled- I advised patient to discuss with amiodarone / cardiology and did not fill that one.  Hopefully he can come off of it because he wants to limit medications and it is hard on thyroid.  Diabetic polyneuropathy associated with type 2 diabetes mellitus -     Microalbumin / creatinine urine ratio; Future -     Empagliflozin; Take 1 tablet (10 mg total) by mouth daily.  Dispense: 90 tablet; Refill: 3 -     Rosuvastatin Calcium; Take 1 tablet (20 mg total) by mouth daily. Replaces 10 mg dose, can cause cramps  Dispense: 90 tablet; Refill: 3 -     Ozempic (0.25 or 0.5 MG/DOSE); Inject 0.25 mg into the skin once a week.  Dispense: 2 mL; Refill: 0  Need for shingles vaccine  Screen for colon cancer -     Cologuard; Future  AKI (acute kidney injury)  Acute on chronic right-sided congestive heart failure -     Empagliflozin; Take 1 tablet (10 mg total) by mouth daily.  Dispense: 90 tablet; Refill: 3 -     traZODone HCl; Take 0.5 tablets (25 mg total) by mouth at bedtime as needed for sleep.   Dispense: 90 tablet; Refill: 3 -     Metoprolol Succinate ER; Take 1 tablet (50 mg total) by mouth daily.  Dispense: 90 tablet; Refill: 3 -     Entresto; Take 1 tablet by mouth 2 (two) times daily.  Dispense: 60 tablet; Refill: 8 -     Spironolactone; Take 1 tablet (25 mg total) by mouth daily.  Dispense: 90 tablet; Refill: 3 -     Torsemide; Take 1 tablet (20 mg total) by mouth daily.  Dispense: 90 tablet; Refill: 3  Acute gout of left wrist, unspecified cause -     Allopurinol; Take 1 tablet (300 mg total) by mouth daily.  Dispense: 90 tablet; Refill: 3  Primary hypertension  Atrial fibrillation with RVR -     Rivaroxaban; Take 1 tablet (20 mg total) by mouth daily with supper.  Dispense: 90 tablet; Refill: 3 -     Metoprolol Succinate ER; Take 1 tablet (50 mg total) by mouth daily.  Dispense: 90 tablet; Refill: 3  Snoring  Acquired hypothyroidism Assessment & Plan: He wants to go homeopathic, encouraged patient to to continue with levothyroxine   Orders: -     TSH; Future -     T4, free; Future -     Levothyroxine Sodium; Take 1 tablet (50 mcg total) by mouth daily. Needs to be adjusted every 3-6 months based on labwork if still taking amiodarone  Dispense: 90  tablet; Refill: 3  Obesity, Class III, BMI 40-49.9 (morbid obesity)  Microcytosis -     Hemoglobpathy+Fer w/A Thal Rfx; Future  Injury of kidney, unspecified laterality, subsequent encounter  Vertigo  Erectile dysfunction, unspecified erectile dysfunction type -     Testosterone; Future -     Tadalafil; Take 0.5-1 tablets (10-20 mg total) by mouth every other day as needed for erectile dysfunction. Do not take nitro with this.  Can kill you.  Must tell emergency room if taking.  Dispense: 90 tablet; Refill: 3  Hyperlipidemia, unspecified hyperlipidemia type  History of stroke  Drug-induced hypokalemia -     Potassium Chloride ER; Take 1 tablet (10 mEq total) by mouth daily. Take with torsemide because torsemide  lowers potassium  Dispense: 90 tablet; Refill: 3  High risk for readmission Assessment & Plan: Great effort was given to attempting comprehensive evaluation and medication reconciliation - but encouraged Parker [name]  to to do frequent follow up    Other orders -     predniSONE; Take 2 pills for 3 days, 1 pill for 4 days start immediately at first signs of gout flare  Dispense: 10 tablet; Refill: 0       Subjective:   Parker [name] presents today with intent to establish care with Lula Olszewski, MD as his Primary Care Provider (PCP) going forward.   His main concern(s) / chief complaint(s) are New Patient (Initial Visit) (Has no Primary Care Provider (PCP) in [place], has been following with Frazier Butt in [place] but planning to not return there.)   HPI  Problem-oriented charting was used to develop and update his medical history: Problem  High Risk for Readmission   General Risk Score: 10  Values used to calculate this score:   Points  Metrics      0        Age: 65      3        Hospital Admissions: 4      3        ED Visits: 3      0        Has Chronic Obstructive Pulmonary Disease: No      1        Has Diabetes: Yes      1        Has Congestive Heart Failure: Yes      1        Has Liver Disease: Yes      1        Has Depression: Yes      0        Current PCP: Lula Olszewski, MD      0        Has Medicaid: No     Hyperlipidemia  Erectile Dysfunction  History of Stroke  Drug-Induced Hypokalemia  Hypothyroidism   Reports compliance with levothyroxine 50 Lab Results  Component Value Date/Time   TSH 3.423 02/14/2023 11:06 AM   TSH 3.562 01/24/2023 04:47 AM         Depression Screen    03/07/2023    1:20 PM  PHQ 2/9 Scores  PHQ - 2 Score 0   Results for orders placed or performed in visit on 03/07/23  Microalbumin / creatinine urine ratio  Result Value Ref Range   Microalb, Ur <0.7 0.0 - 1.9 mg/dL   Creatinine,U 16.1 mg/dL   Microalb Creat Ratio 4.6 0.0  - 30.0 mg/g    The following were  reviewed and entered/updated into his MEDICAL RECORD NUMBERPast Medical History:  Diagnosis Date   (HFpEF) heart failure with preserved ejection fraction    AKI (acute kidney injury) 01/23/2023   Lab Results  Component  Value  Date     CREATININE  0.97  03/03/2023     CREATININE  1.14  02/19/2023     CREATININE  1.29 (H)  02/18/2023         Arthritis    knees. elbow, ankle   Cataract    CHF (congestive heart failure)    Diabetes mellitus without complication    Diastolic CHF 02/14/2023   Dyspnea    when in A-fib   Dysrhythmia    A-fib   GERD (gastroesophageal reflux disease)    takes Dexilant prn   Hypertension    takes Lisinopril-HCTZ daily   Muscle spasm    takes Relafen daily   Peripheral edema    Plantar fasciitis    right   Sleep apnea    Past Surgical History:  Procedure Laterality Date   CARDIOVERSION N/A 02/19/2023   Procedure: CARDIOVERSION;  Surgeon: Dolores Patty, MD;  Location: [facility] ENDOSCOPY;  Service: Cardiovascular;  Laterality: N/A;   EAR CYST EXCISION N/A 08/06/2013   Procedure: CYST REMOVAL;  Surgeon: Melvenia Beam, MD;  Location: [facility] OR;  Service: ENT;  Laterality: N/A;   ESOPHAGOGASTRODUODENOSCOPY     left knee arthroscopy  2003   PAROTIDECTOMY Right 08/06/2013   Procedure: PAROTIDECTOMY;  Surgeon: Melvenia Beam, MD;  Location: [facility] OR;  Service: ENT;  Laterality: Right;   RIGHT HEART CATH N/A 02/17/2023   Procedure: RIGHT HEART CATH;  Surgeon: Dolores Patty, MD;  Location: [facility] INVASIVE CV LAB;  Service: Cardiovascular;  Laterality: N/A;   TEE WITHOUT CARDIOVERSION N/A 02/19/2023   Procedure: TRANSESOPHAGEAL ECHOCARDIOGRAM (TEE);  Surgeon: Dolores Patty, MD;  Location: [facility] ENDOSCOPY;  Service: Cardiovascular;  Laterality: N/A;   TONSILLECTOMY Bilateral 08/06/2013   Procedure: TONSILLECTOMY;  Surgeon: Melvenia Beam, MD;  Location: [facility] OR;  Service: ENT;  Laterality: Bilateral;   History reviewed. No pertinent family  history. Outpatient Medications Prior to Visit  Medication Sig Dispense Refill   acetaminophen (TYLENOL) 500 MG tablet Take 500-1,000 mg by mouth every 8 (eight) hours as needed for moderate pain.     albuterol (VENTOLIN HFA) 108 (90 Base) MCG/ACT inhaler Inhale 2 puffs into the lungs every 6 (six) hours as needed for shortness of breath.     amiodarone (PACERONE) 200 MG tablet Take 2 tablets (400 mg total) by mouth 2 (two) times daily for 7 days, THEN 1 tablet (200 mg total) 2 (two) times daily for 14 days, THEN 1 tablet (200 mg total) daily. 60 tablet 1   colchicine 0.6 MG tablet Take 1 tablet (0.6 mg total) by mouth daily as needed (gout flares). 30 tablet 1   fluticasone (FLONASE) 50 MCG/ACT nasal spray Place 2 sprays into both nostrils daily. (Patient taking differently: Place 2 sprays into both nostrils daily as needed for allergies.) 16 g 0   folic acid (FOLVITE) 1 MG tablet Take 1 mg by mouth daily as needed (low iron).     meclizine (ANTIVERT) 25 MG tablet Take 25 mg by mouth 3 (three) times daily as needed for dizziness.     Multiple Vitamins-Minerals (MULTIVITAMIN WITH MINERALS) tablet Take 1 tablet by mouth daily.     rosuvastatin (CRESTOR) 10 MG tablet Take 1 tablet (10 mg total) by mouth daily. 30 tablet 5  ELIQUIS 5 MG TABS tablet Take 1 tablet (5 mg total) by mouth 2 (two) times daily. 60 tablet 5   empagliflozin (JARDIANCE) 10 MG TABS tablet Take 1 tablet (10 mg total) by mouth daily. 30 tablet 5   gabapentin (NEURONTIN) 300 MG capsule Oral for 20 Days     levothyroxine (SYNTHROID) 50 MCG tablet Take 50 mcg by mouth daily.     metoprolol succinate (TOPROL-XL) 50 MG 24 hr tablet Take 1 tablet (50 mg total) by mouth daily. 30 tablet 5   potassium chloride (KLOR-CON) 10 MEQ tablet Take 10 mEq by mouth daily.     sacubitril-valsartan (ENTRESTO) 49-51 MG Take 1 tablet by mouth 2 (two) times daily. 60 tablet 8   spironolactone (ALDACTONE) 25 MG tablet Take 1 tablet (25 mg total) by  mouth daily. 30 tablet 5   torsemide (DEMADEX) 20 MG tablet Take 1 tablet (20 mg total) by mouth daily. 30 tablet 5   traZODone (DESYREL) 50 MG tablet Take 25 mg by mouth at bedtime as needed for sleep.     nitroGLYCERIN (NITROSTAT) 0.4 MG SL tablet Place 1 tablet under the tongue every 5 (five) minutes as needed for chest pain. (Patient not taking: Reported on 03/07/2023)     No facility-administered medications prior to visit.    Allergies  Allergen Reactions   Iodinated Contrast Media Itching   Other Rash and Other (See Comments)    Other reaction(s): Other (See Comments)  Neoprene: Rash   Social History   Tobacco Use   Smoking status: Never   Smokeless tobacco: Never  Vaping Use   Vaping Use: Never used  Substance Use Topics   Alcohol use: Yes    Alcohol/week: 6.0 standard drinks of alcohol    Types: 6 Standard drinks or equivalent per week    Comment: occ   Drug use: No    Immunization History  Administered Date(s) Administered   Influenza Inj Mdck Quad Pf 08/26/2014   Influenza, Quadrivalent, Recombinant, Inj, Pf 10/30/2018, 09/30/2019   Influenza-Unspecified 08/26/2014, 10/30/2018, 09/30/2019   Pneumococcal Polysaccharide-23 06/28/2021   Tdap 08/26/2014    Objective:  Physical Exam  BP (!) 140/92 (BP Location: Left Arm, Patient Position: Sitting)   Pulse 79   Temp (!) 97.5 F (36.4 C) (Temporal)   Ht 5\' 11"  (1.803 m)   Wt 280 lb 6.4 oz (127.2 kg)   SpO2 98%   BMI 39.11 kg/m  Wt Readings from Last 10 Encounters:  03/07/23 280 lb 6.4 oz (127.2 kg)  03/03/23 279 lb 6.4 oz (126.7 kg)  02/14/23 296 lb 9.6 oz (134.5 kg)  01/25/23 295 lb 3.2 oz (133.9 kg)  03/11/22 287 lb (130.2 kg)  08/06/13 247 lb (112 kg)  07/29/13 247 lb 9.6 oz (112.3 kg)  04/06/13 251 lb (113.9 kg)   Vital signs reviewed.  Nursing notes reviewed. Weight trend reviewed. Abnormalities and Problem-Specific physical exam findings:  truncal adiposity  General Appearance:  Well developed,  well nourished, well-groomed, healthy-appearing male with Body mass index is 39.11 kg/m.Marland Kitchen No acute distress appreciable.   Skin: Clear and well-hydrated. Pulmonary:  Normal work of breathing at rest, no respiratory distress apparent. SpO2: 98 %  Musculoskeletal:  Patient demonstrates smooth and coordinated movements throughout all major joints. All extremities are intact.  Neurological:  Awake, alert, oriented, and engaged.  No obvious focal neurological deficits or cognitive impairments.  Sensorium seems unclouded. Gait is smooth and coordinated Psychiatric:  Appropriate mood, pleasant and cooperative demeanor, cheerful and  engaged during the exam  Results Reviewed (reviewed labs/imaging may be also be found in the assessment / plan section):    Results for orders placed or performed in visit on 03/07/23  Microalbumin / creatinine urine ratio  Result Value Ref Range   Microalb, Ur <0.7 0.0 - 1.9 mg/dL   Creatinine,U 54.0 mg/dL   Microalb Creat Ratio 4.6 0.0 - 30.0 mg/g

## 2023-03-10 ENCOUNTER — Encounter: Payer: Self-pay | Admitting: Internal Medicine

## 2023-03-12 ENCOUNTER — Ambulatory Visit (HOSPITAL_COMMUNITY)
Admission: RE | Admit: 2023-03-12 | Discharge: 2023-03-12 | Disposition: A | Discharge status: Home / Self Care | Payer: PPO | Source: Ambulatory Visit | Attending: Internal Medicine | Admitting: Internal Medicine

## 2023-03-12 DIAGNOSIS — I5032 Chronic diastolic (congestive) heart failure: Secondary | ICD-10-CM | POA: Diagnosis present

## 2023-03-12 LAB — BASIC METABOLIC PANEL
Anion gap: 14 (ref 5–15)
BUN: 13 mg/dL (ref 6–20)
CO2: 21 mmol/L — ABNORMAL LOW (ref 22–32)
Calcium: 9.6 mg/dL (ref 8.9–10.3)
Chloride: 100 mmol/L (ref 98–111)
Creatinine, Ser: 1.2 mg/dL (ref 0.61–1.24)
GFR, Estimated: >60 mL/min (ref >60)
Glucose, Bld: 103 mg/dL — ABNORMAL HIGH (ref 70–99)
Potassium: 4.1 mmol/L (ref 3.5–5.1)
Sodium: 135 mmol/L (ref 135–145)

## 2023-03-13 ENCOUNTER — Other Ambulatory Visit: Payer: Self-pay

## 2023-03-13 DIAGNOSIS — G4733 Obstructive sleep apnea (adult) (pediatric): Secondary | ICD-10-CM

## 2023-03-18 ENCOUNTER — Telehealth: Payer: Self-pay | Admitting: Internal Medicine

## 2023-03-18 NOTE — Telephone Encounter (Signed)
Patient requests to be called to discuss instructions/dosage of Ozempic.

## 2023-03-21 NOTE — Telephone Encounter (Signed)
Spoke with patient concerning this. 

## 2023-03-26 ENCOUNTER — Telehealth: Payer: Self-pay | Admitting: Internal Medicine

## 2023-03-26 NOTE — Telephone Encounter (Signed)
Patient dropped off document  Statement for Therapeutic shoes , to be filled out by provider. Patient requested to send it via Fax within ASAP. Document is located in providers tray at front office.Please advise at Mobile [phone] (mobile)

## 2023-03-29 ENCOUNTER — Other Ambulatory Visit: Payer: Self-pay | Admitting: Internal Medicine

## 2023-03-29 DIAGNOSIS — E1142 Type 2 diabetes mellitus with diabetic polyneuropathy: Secondary | ICD-10-CM

## 2023-04-02 NOTE — Telephone Encounter (Signed)
Spoke with patient concerning this. This form will be completed at his next OV on 5/6.

## 2023-04-07 ENCOUNTER — Encounter: Payer: Self-pay | Admitting: Internal Medicine

## 2023-04-07 ENCOUNTER — Ambulatory Visit (INDEPENDENT_AMBULATORY_CARE_PROVIDER_SITE_OTHER): Payer: PPO | Admitting: Internal Medicine

## 2023-04-07 VITALS — BP 136/78 | HR 67 | Temp 97.9°F | Ht 71.0 in | Wt 270.2 lb

## 2023-04-07 DIAGNOSIS — T50905A Adverse effect of unspecified drugs, medicaments and biological substances, initial encounter: Secondary | ICD-10-CM

## 2023-04-07 DIAGNOSIS — E11621 Type 2 diabetes mellitus with foot ulcer: Secondary | ICD-10-CM

## 2023-04-07 DIAGNOSIS — E1142 Type 2 diabetes mellitus with diabetic polyneuropathy: Secondary | ICD-10-CM | POA: Diagnosis not present

## 2023-04-07 DIAGNOSIS — B353 Tinea pedis: Secondary | ICD-10-CM

## 2023-04-07 DIAGNOSIS — Z91148 Patient's other noncompliance with medication regimen for other reason: Secondary | ICD-10-CM

## 2023-04-07 DIAGNOSIS — I10 Essential (primary) hypertension: Secondary | ICD-10-CM | POA: Diagnosis not present

## 2023-04-07 DIAGNOSIS — L97511 Non-pressure chronic ulcer of other part of right foot limited to breakdown of skin: Secondary | ICD-10-CM

## 2023-04-07 DIAGNOSIS — Z7984 Long term (current) use of oral hypoglycemic drugs: Secondary | ICD-10-CM

## 2023-04-07 DIAGNOSIS — Z599 Problem related to housing and economic circumstances, unspecified: Secondary | ICD-10-CM

## 2023-04-07 DIAGNOSIS — Z7985 Long-term (current) use of injectable non-insulin antidiabetic drugs: Secondary | ICD-10-CM

## 2023-04-07 DIAGNOSIS — E119 Type 2 diabetes mellitus without complications: Secondary | ICD-10-CM

## 2023-04-07 MED ORDER — OZEMPIC (0.25 OR 0.5 MG/DOSE) 2 MG/3ML ~~LOC~~ SOPN
0.5000 mg | PEN_INJECTOR | SUBCUTANEOUS | 11 refills | Status: DC
Start: 2023-04-07 — End: 2024-01-20

## 2023-04-07 MED ORDER — CLOTRIMAZOLE 1 % EX OINT
1.0000 | TOPICAL_OINTMENT | CUTANEOUS | 11 refills | Status: DC
Start: 2023-04-07 — End: 2024-09-13

## 2023-04-07 NOTE — Assessment & Plan Note (Addendum)
Diabetes is currently very well controlled, based on available Hemoglobin A1c and glucose monitoring data listed in problem overview.  Diabetic education: ongoing education regarding chronic disease management for diabetes was given today. We continue to reinforce the ABC's of diabetic management: A1c (<7 or 8 dependent upon patient), tight blood pressure control, and cholesterol management with goal LDL < 100 minimally. We discuss diet strategies, exercise recommendations, medication options and possible side effects. At each visit, we review recommended immunizations and preventive care recommendations for diabetics and stress that good diabetic control can prevent other problems.  Importance of regular foot checks and yearly eye exams has been reinforced and is included here for a reminder I completed shoe paperwork to get custom molded diabetes mellitus shoes.  Diabetic Medications-Plan for today: INCREASE OZEMPIC to 0.5 mg WEEKLY Diabetic Medications as of 04/07/2023           empagliflozin (JARDIANCE) 10 MG TABS tablet (Taking) Take 1 tablet (10 mg total) by mouth daily.   Semaglutide,0.25 or 0.5MG /DOS, (OZEMPIC, 0.25 OR 0.5 MG/DOSE,) 2 MG/3ML SOPN (Taking) INJECT 0.25MG  INTO THE SKIN ONE TIME PER WEEK       We briefly discussed the potential benefits of continuous glucose monitor  but he expressed a preference to not move forward with my offer(s) to facilitate the proposed intervention(s) at this time. We will revisit this option if needed at future visits and  he is always encouraged to contact me if the situation changes or he changes his mind.

## 2023-04-07 NOTE — Patient Instructions (Signed)
It was a pleasure seeing you today!  Your health and satisfaction are our top priorities.  Next Steps:  [x]  Early Intervention: Schedule sooner, call our on-call services, or go to emergency room if concerning symptoms arise (pain, changes, new concerns). [x]  Flexible Follow-Up: We recommend a Return in about 1 month (around 05/08/2023) for chronic disease monitoring and management. for optimal care. This allows for ideal progress monitoring and treatment adjustments. If the recommended follow-up interval is challenging, consider telemedicine convenience follow up or asking for extended prescriptions (criteria apply). [x]  Preventive Care: Schedule your annual preventive care visit! It's typically covered by insurance and helps identify potential health issues early. [x]  Lab & X-ray Appointments: Incomplete tests scheduled today, or call to schedule. X-rays: [facility] (M-F, 8:30am-noon or 1pm-5pm). [x]  Medical Information Release: Sign a release form at front desk to obtain relevant medical information we don't have.  Bring to Your Next Appointment:  Medications: All medication bottles for an accurate prescription record. Health Diaries: If monitoring health conditions, keep a diary for discussions at your next appointment.  Review your draft clinical notes below and the final encounter summary tomorrow on MyChart. Parker [name] was seen today for 1 month follow-up, diabetic foot exam and complete form for diabetic shoes.  Diabetic polyneuropathy associated with type 2 diabetes mellitus (HCC)  Controlled type 2 diabetes mellitus without complication, without long-term current use of insulin (HCC) Overview: 04/07/2023 interim history: He reports that his perception is that his diabetes as been well controlled since last visit  He reports home capillary blood glucose readings since last visit are in the range of not checking  He reports he has NOT had any significant lows or highs or  side effect(s)  He reports compliance/adherence with his current medication(s) which include: Diabetic Medications as of 04/07/2023           empagliflozin (JARDIANCE) 10 MG TABS tablet (Taking) Take 1 tablet (10 mg total) by mouth daily.   Semaglutide,0.25 or 0.5MG /DOS, (OZEMPIC, 0.25 OR 0.5 MG/DOSE,) 2 MG/3ML SOPN (Taking) INJECT 0.25MG  INTO THE SKIN ONE TIME PER WEEK       Diabetes Health Maintenance Due  Topic Date Due   OPHTHALMOLOGY EXAM  Never done   HEMOGLOBIN A1C  07/26/2023   FOOT EXAM  04/06/2024   04/07/2023 Diabetic Foot Exam - Simple   Simple Foot Form Diabetic Foot exam was performed with the following findings: Yes 04/07/2023  3:05 PM  Visual Inspection See comments: Yes Sensation Testing See comments: Yes Pulse Check See comments: Yes Comments Exam completed May 6 foot exam showed an ulceration and between his fourth and fifth toes on the right foot secondary to difficulty with cleaning out debris between those 2 toes which fit very closely together he is going to need wider shoes to help him spread out and probably put something between the toes to keep keep the more spread out He also has very mild neuropathy but he was correct on almost all monofilament testing with extensive testing done his pulses are weak but his skin is warm there is no hair on the foot The bottoms of the foot have very dry skin and the fifth toenail looks onychomycotic overall it appears to be tinea pedis complicating fungal nail infection     Care Team Ophthalmologist:  No care team member to displayfinancial limitations with adherence to ophthalmology yearly visit.  Lab Results  Component Value Date   HGBA1C 6.1 (H) 01/25/2023   Lab Results  Component Value Date   MICROALBUR <0.7 03/07/2023   No results found for: "LDLCALC"  Diabetes Composite Score: 4  Values used to calculate this score:   Points  Metrics      0        Blood Pressure: 140/92      1        Prescribed Statins:  Yes      1        Hemoglobin A1c: 6.1%      1        Smokes Tobacco: No      1        Prescribed Aspirin: Yes        Assessment & Plan: Diabetes is currently very well controlled, based on available Hemoglobin A1c and glucose monitoring data listed in problem overview.  Diabetic education: ongoing education regarding chronic disease management for diabetes was given today. We continue to reinforce the ABC's of diabetic management: A1c (<7 or 8 dependent upon patient), tight blood pressure control, and cholesterol management with goal LDL < 100 minimally. We discuss diet strategies, exercise recommendations, medication options and possible side effects. At each visit, we review recommended immunizations and preventive care recommendations for diabetics and stress that good diabetic control can prevent other problems.  Importance of regular foot checks and yearly eye exams has been reinforced and is included here for a reminder I completed shoe paperwork to get custom molded diabetes mellitus shoes.  Diabetic Medications-Plan for today: INCREASE OZEMPIC to 0.5 mg WEEKLY Diabetic Medications as of 04/07/2023           empagliflozin (JARDIANCE) 10 MG TABS tablet (Taking) Take 1 tablet (10 mg total) by mouth daily.   Semaglutide,0.25 or 0.5MG /DOS, (OZEMPIC, 0.25 OR 0.5 MG/DOSE,) 2 MG/3ML SOPN (Taking) INJECT 0.25MG  INTO THE SKIN ONE TIME PER WEEK       We briefly discussed the potential benefits of continuous glucose monitor  but he expressed a preference to not move forward with my offer(s) to facilitate the proposed intervention(s) at this time. We will revisit this option if needed at future visits and  he is always encouraged to contact me if the situation changes or he changes his mind.   Orders: -     HM Diabetes Foot Exam -     Ozempic (0.25 or 0.5 MG/DOSE); Inject 0.5 mg into the skin once a week.  Dispense: 3 mL; Refill: 11  Tinea pedis of both feet -     HM Diabetes Foot Exam -      Clotrimazole; Apply 1 Application topically 2 (two) times a week.  Dispense: 30 g; Refill: 11  Diabetic ulcer of right foot associated with type 2 diabetes mellitus, limited to breakdown of skin, unspecified part of foot (HCC) -     HM Diabetes Foot Exam  Primary hypertension Overview: Current hypertension medications:       Sig   metoprolol succinate (TOPROL-XL) 50 MG 24 hr tablet (Taking) Take 1 tablet (50 mg total) by mouth daily.   sacubitril-valsartan (ENTRESTO) 49-51 MG (Taking) Take 1 tablet by mouth 2 (two) times daily.   spironolactone (ALDACTONE) 25 MG tablet (Taking) Take 1 tablet (25 mg total) by mouth daily.   tadalafil (CIALIS) 20 MG tablet (Taking) Take 0.5-1 tablets (10-20 mg total) by mouth every other day as needed for erectile dysfunction. Do not take nitro with this.  Can kill you.  Must tell emergency room if taking.   torsemide (DEMADEX) 20 MG  tablet (Taking) Take 1 tablet (20 mg total) by mouth daily.       Patient reports compliance with current medications and no significant side effect(s)  Home readings: ok on 1-2x daily usually 120-142/80-97 BP Readings from Last 3 Encounters:  04/07/23 136/78  03/07/23 (!) 140/92  03/03/23 138/82       Assessment & Plan: Uncontrolled despite excellent adherence.  Following a detailed discussion about aggressive blood pressure control and its potential benefits and risks, the patient expressed a preference to monitor the situation for now.  He acknowledged the potential for serious health consequences of delaying this intervention.  In line with his preferences, we will continue to monitor the situation and discuss again as at the next visit or sooner if his condition changes.  I encouraged him to keep me updated on any developments.   Reviewed available data from patient and  BP Readings from Last 3 Encounters:  04/07/23 136/78  03/07/23 (!) 140/92  03/03/23 138/82   My individualized, goal average blood pressure  for this patient, after considering the evidence for and against aggressive blood pressure goals as well as their past medical history and preferences, is 140/90 In my medical opinion, this problem is stable, borderline controlled  Will continue the current medication(s), unchanged:  Current hypertension medications:       Sig   metoprolol succinate (TOPROL-XL) 50 MG 24 hr tablet (Taking) Take 1 tablet (50 mg total) by mouth daily.   sacubitril-valsartan (ENTRESTO) 49-51 MG (Taking) Take 1 tablet by mouth 2 (two) times daily.   spironolactone (ALDACTONE) 25 MG tablet (Taking) Take 1 tablet (25 mg total) by mouth daily.   tadalafil (CIALIS) 20 MG tablet (Taking) Take 0.5-1 tablets (10-20 mg total) by mouth every other day as needed for erectile dysfunction. Do not take nitro with this.  Can kill you.  Must tell emergency room if taking.   torsemide (DEMADEX) 20 MG tablet (Taking) Take 1 tablet (20 mg total) by mouth daily.      This was decided by careful and shared decision making based on collaborative review of their situation and the associated data.     Information for patient review: Please limit and avoid: salt, alcohol, NSAIDS, excess body weight, smoking, stress, sedentary lifestyles The risks of poor control over time are FUTURE stroke and heart attacks, but if you have a blood pressure over 180 and any red flag symptoms(headache/shortness of breath/confusion/chest discomfort) you should go to the ER.  You are encouraged to do home blood pressure monitoring, at least as many times per week as blood pressure medications you are on.  For example, bring a diary with 3 home blood pressure readings per week to each visit if you are on 3 blood pressure medications.   If you are on more than 3 medication(s) or have certain risk factors, we should do a resistant hypertension workup See AFTER VISIT SUMMARY for addition educational information provided Please let me know in advance when you need  medication(s) refills, consistently taking your medication is very important!    Financial difficulty  H/O medication noncompliance Overview: Financial issues make managing multiple medical problems difficult   Drug-induced hypokalemia Overview: Lab Results  Component Value Date/Time   K 4.1 03/12/2023 12:10 PM   K 3.6 03/03/2023 02:39 PM   K 4.1 02/19/2023 05:00 AM   K 3.2 (L) 02/18/2023 04:10 AM   K 4.0 02/17/2023 01:31 PM   K 3.9 02/17/2023 01:30 PM   K 4.1  02/17/2023 01:26 PM         Getting Answers and Following Up:  Simple Questions & Concerns: Contact us at [phone] or MyChart messaging. Complex Concerns: Schedule an appointment. Lab & Imaging Results: We'll contact you directly for abnormal results or if you don't use MyChart. Normal results on MyChart within 2-3 business days, with a review message from Dr. Jon Billings. Need results sooner? Contact us. Referrals: Our coordinator manages referrals; the specialist's office will contact you within 2 weeks. Call us if you haven't heard from them after 2 weeks. Staying Connected: Activate MyChart for the fastest, easiest way to access results and message [place]. See instructions on the last page of this paperwork. Billing: X-ray & Lab orders billed by separate companies. Visit charges: discuss with administrative services. Feedback: Share any complaints or compliments with Dr. Jon Billings or Practice Administrators Edwena Felty or Hasna Boutaib). Scheduling Tips: Shortest wait times are 8 am and 1 pm appointments). Longer appointments available, see front desk [employer] may require multiple appointments). We're committed to supporting your health journey. Questions? Contact our office. Remember, you're key to achieving the best outcomes.

## 2023-04-07 NOTE — Assessment & Plan Note (Signed)
Uncontrolled despite excellent adherence.  Following a detailed discussion about aggressive blood pressure control and its potential benefits and risks, the patient expressed a preference to monitor the situation for now.  He acknowledged the potential for serious health consequences of delaying this intervention.  In line with his preferences, we will continue to monitor the situation and discuss again as at the next visit or sooner if his condition changes.  I encouraged him to keep me updated on any developments.   Reviewed available data from patient and  BP Readings from Last 3 Encounters:  04/07/23 136/78  03/07/23 (!) 140/92  03/03/23 138/82   My individualized, goal average blood pressure for this patient, after considering the evidence for and against aggressive blood pressure goals as well as their past medical history and preferences, is 140/90 In my medical opinion, this problem is stable, borderline controlled  Will continue the current medication(s), unchanged:  Current hypertension medications:       Sig   metoprolol succinate (TOPROL-XL) 50 MG 24 hr tablet (Taking) Take 1 tablet (50 mg total) by mouth daily.   sacubitril-valsartan (ENTRESTO) 49-51 MG (Taking) Take 1 tablet by mouth 2 (two) times daily.   spironolactone (ALDACTONE) 25 MG tablet (Taking) Take 1 tablet (25 mg total) by mouth daily.   tadalafil (CIALIS) 20 MG tablet (Taking) Take 0.5-1 tablets (10-20 mg total) by mouth every other day as needed for erectile dysfunction. Do not take nitro with this.  Can kill you.  Must tell emergency room if taking.   torsemide (DEMADEX) 20 MG tablet (Taking) Take 1 tablet (20 mg total) by mouth daily.      This was decided by careful and shared decision making based on collaborative review of their situation and the associated data.     Information for patient review: Please limit and avoid: salt, alcohol, NSAIDS, excess body weight, smoking, stress, sedentary lifestyles The  risks of poor control over time are FUTURE stroke and heart attacks, but if you have a blood pressure over 180 and any red flag symptoms(headache/shortness of breath/confusion/chest discomfort) you should go to the ER.  You are encouraged to do home blood pressure monitoring, at least as many times per week as blood pressure medications you are on.  For example, bring a diary with 3 home blood pressure readings per week to each visit if you are on 3 blood pressure medications.   If you are on more than 3 medication(s) or have certain risk factors, we should do a resistant hypertension workup See AFTER VISIT SUMMARY for addition educational information provided Please let me know in advance when you need medication(s) refills, consistently taking your medication is very important!

## 2023-04-07 NOTE — Progress Notes (Signed)
[facility]: [phone]   Routine Medical Office Visit  Patient:  Parker [name]      Age: 57 y.o.       Sex:  male  Date:   04/07/2023 PCP:    Lula Olszewski, MD   Today's Healthcare Provider: Lula Olszewski, MD       Assessment and Plan:   Diabetic polyneuropathy associated with type 2 diabetes mellitus (HCC)  Controlled type 2 diabetes mellitus without complication, without long-term current use of insulin (HCC) Assessment & Plan: Diabetes is currently very well controlled, based on available Hemoglobin A1c and glucose monitoring data listed in problem overview.  Diabetic education: ongoing education regarding chronic disease management for diabetes was given today. We continue to reinforce the ABC's of diabetic management: A1c (<7 or 8 dependent upon patient), tight blood pressure control, and cholesterol management with goal LDL < 100 minimally. We discuss diet strategies, exercise recommendations, medication options and possible side effects. At each visit, we review recommended immunizations and preventive care recommendations for diabetics and stress that good diabetic control can prevent other problems.  Importance of regular foot checks and yearly eye exams has been reinforced and is included here for a reminder I completed shoe paperwork to get custom molded diabetes mellitus shoes.  Diabetic Medications-Plan for today: INCREASE OZEMPIC to 0.5 mg WEEKLY Diabetic Medications as of 04/07/2023           empagliflozin (JARDIANCE) 10 MG TABS tablet (Taking) Take 1 tablet (10 mg total) by mouth daily.   Semaglutide,0.25 or 0.5MG /DOS, (OZEMPIC, 0.25 OR 0.5 MG/DOSE,) 2 MG/3ML SOPN (Taking) INJECT 0.25MG  INTO THE SKIN ONE TIME PER WEEK       We briefly discussed the potential benefits of continuous glucose monitor  but he expressed a preference to not move forward with my offer(s) to facilitate the proposed intervention(s) at this time. We will revisit  this option if needed at future visits and  he is always encouraged to contact me if the situation changes or he changes his mind.   Orders: -     HM Diabetes Foot Exam -     Ozempic (0.25 or 0.5 MG/DOSE); Inject 0.5 mg into the skin once a week.  Dispense: 3 mL; Refill: 11  Tinea pedis of both feet -     HM Diabetes Foot Exam -     Clotrimazole; Apply 1 Application topically 2 (two) times a week.  Dispense: 30 g; Refill: 11  Diabetic ulcer of right foot associated with type 2 diabetes mellitus, limited to breakdown of skin, unspecified part of foot (HCC) -     HM Diabetes Foot Exam  Primary hypertension Assessment & Plan: Uncontrolled despite excellent adherence.  Following a detailed discussion about aggressive blood pressure control and its potential benefits and risks, the patient expressed a preference to monitor the situation for now.  He acknowledged the potential for serious health consequences of delaying this intervention.  In line with his preferences, we will continue to monitor the situation and discuss again as at the next visit or sooner if his condition changes.  I encouraged him to keep me updated on any developments.   Reviewed available data from patient and  BP Readings from Last 3 Encounters:  04/07/23 136/78  03/07/23 (!) 140/92  03/03/23 138/82   My individualized, goal average blood pressure for this patient, after considering the evidence for and against aggressive blood pressure goals as well as their past medical history and  preferences, is 140/90 In my medical opinion, this problem is stable, borderline controlled  Will continue the current medication(s), unchanged:  Current hypertension medications:       Sig   metoprolol succinate (TOPROL-XL) 50 MG 24 hr tablet (Taking) Take 1 tablet (50 mg total) by mouth daily.   sacubitril-valsartan (ENTRESTO) 49-51 MG (Taking) Take 1 tablet by mouth 2 (two) times daily.   spironolactone (ALDACTONE) 25 MG tablet  (Taking) Take 1 tablet (25 mg total) by mouth daily.   tadalafil (CIALIS) 20 MG tablet (Taking) Take 0.5-1 tablets (10-20 mg total) by mouth every other day as needed for erectile dysfunction. Do not take nitro with this.  Can kill you.  Must tell emergency room if taking.   torsemide (DEMADEX) 20 MG tablet (Taking) Take 1 tablet (20 mg total) by mouth daily.      This was decided by careful and shared decision making based on collaborative review of their situation and the associated data.     Information for patient review: Please limit and avoid: salt, alcohol, NSAIDS, excess body weight, smoking, stress, sedentary lifestyles The risks of poor control over time are FUTURE stroke and heart attacks, but if you have a blood pressure over 180 and any red flag symptoms(headache/shortness of breath/confusion/chest discomfort) you should go to the ER.  You are encouraged to do home blood pressure monitoring, at least as many times per week as blood pressure medications you are on.  For example, bring a diary with 3 home blood pressure readings per week to each visit if you are on 3 blood pressure medications.   If you are on more than 3 medication(s) or have certain risk factors, we should do a resistant hypertension workup See AFTER VISIT SUMMARY for addition educational information provided Please let me know in advance when you need medication(s) refills, consistently taking your medication is very important!    Financial difficulty  H/O medication noncompliance  Drug-induced hypokalemia    Treatment plan discussed and reviewed in detail. Explained medication safety and potential side effects. Agreed on patient returning to office if symptoms worsen, persist, or new symptoms develop. Discussed precautions in case of needing to visit the Emergency Department. Answered all patient questions and confirmed understanding and comfort with the plan. Encouraged patient to contact our office if they  have any questions or concerns.          Clinical Presentation:   57 y.o. male here today for 1 month follow-up, Diabetic foot exam, and Complete form for diabetic shoes   HPI   Updated chart data:  Problem  Drug-Induced Hypokalemia   Lab Results  Component Value Date/Time   K 4.1 03/12/2023 12:10 PM   K 3.6 03/03/2023 02:39 PM   K 4.1 02/19/2023 05:00 AM   K 3.2 (L) 02/18/2023 04:10 AM   K 4.0 02/17/2023 01:31 PM   K 3.9 02/17/2023 01:30 PM   K 4.1 02/17/2023 01:26 PM      Controlled Type 2 Diabetes Mellitus Without Complication, Without Long-Term Current Use of Insulin (Hcc)   04/07/2023 interim history: He reports that his perception is that his diabetes as been well controlled since last visit  He reports home capillary blood glucose readings since last visit are in the range of not checking  He reports he has NOT had any significant lows or highs or side effect(s)  He reports compliance/adherence with his current medication(s) which include: Diabetic Medications as of 04/07/2023  empagliflozin (JARDIANCE) 10 MG TABS tablet (Taking) Take 1 tablet (10 mg total) by mouth daily.   Semaglutide,0.25 or 0.5MG /DOS, (OZEMPIC, 0.25 OR 0.5 MG/DOSE,) 2 MG/3ML SOPN (Taking) INJECT 0.25MG  INTO THE SKIN ONE TIME PER WEEK      Diabetes Health Maintenance Due  Topic Date Due   OPHTHALMOLOGY EXAM  Never done   HEMOGLOBIN A1C  07/26/2023   FOOT EXAM  04/06/2024  04/07/2023 Diabetic Foot Exam - Simple   Simple Foot Form Diabetic Foot exam was performed with the following findings: Yes 04/07/2023  3:05 PM  Visual Inspection See comments: Yes Sensation Testing See comments: Yes Pulse Check See comments: Yes Comments Exam completed May 6 foot exam showed an ulceration and between his fourth and fifth toes on the right foot secondary to difficulty with cleaning out debris between those 2 toes which fit very closely together he is going to need wider shoes to help him spread  out and probably put something between the toes to keep keep the more spread out He also has very mild neuropathy but he was correct on almost all monofilament testing with extensive testing done his pulses are weak but his skin is warm there is no hair on the foot The bottoms of the foot have very dry skin and the fifth toenail looks onychomycotic overall it appears to be tinea pedis complicating fungal nail infection    Care Team Ophthalmologist:  No care team member to displayfinancial limitations with adherence to ophthalmology yearly visit.  Lab Results  Component Value Date   HGBA1C 6.1 (H) 01/25/2023   Lab Results  Component Value Date   MICROALBUR <0.7 03/07/2023  No results found for: "LDLCALC"  Diabetes Composite Score: 4  Values used to calculate this score:   Points  Metrics      0        Blood Pressure: 140/92      1        Prescribed Statins: Yes      1        Hemoglobin A1c: 6.1%      1        Smokes Tobacco: No      1        Prescribed Aspirin: Yes         Htn (Hypertension)   Current hypertension medications:       Sig   metoprolol succinate (TOPROL-XL) 50 MG 24 hr tablet (Taking) Take 1 tablet (50 mg total) by mouth daily.   sacubitril-valsartan (ENTRESTO) 49-51 MG (Taking) Take 1 tablet by mouth 2 (two) times daily.   spironolactone (ALDACTONE) 25 MG tablet (Taking) Take 1 tablet (25 mg total) by mouth daily.   tadalafil (CIALIS) 20 MG tablet (Taking) Take 0.5-1 tablets (10-20 mg total) by mouth every other day as needed for erectile dysfunction. Do not take nitro with this.  Can kill you.  Must tell emergency room if taking.   torsemide (DEMADEX) 20 MG tablet (Taking) Take 1 tablet (20 mg total) by mouth daily.       Patient reports compliance with current medications and no significant side effect(s)  Home readings: ok on 1-2x daily usually 120-142/80-97 BP Readings from Last 3 Encounters:  04/07/23 136/78  03/07/23 (!) 140/92  03/03/23 138/82         Positive Cardiac Stress Test  Chronic Heart Failure With Preserved Ejection Fraction (Hcc)   Diastolic On entresto/jardiance [facility]   H/O Medication  Noncompliance   Financial issues make managing multiple medical problems difficult   Tricuspid Valve Regurgitation  Hypomagnesemia  Osa (Obstructive Sleep Apnea)   CPAP initiated by Armanda Magic after  03/08/23 FINDINGS:  1.  Moderate Obstructive Sleep Apnea with AHI 26.7/hr.   2.  No Central Sleep Apnea with pAHIc 2.3/hr.  3.  Oxygen desaturations as low as 75%.  4.  Severe snoring was present. O2 sats were < 88% for 68.7 min.  5.  Total sleep time was 7 hrs and 15 min.  6.  30.9% of total sleep time was spent in REM sleep.   7.  Prolonged sleep onset latency at 45 min  8.  Shortened REM sleep onset latency at 59 min.   9.  Total awakenings were 17.  10. Arrhythmia detection: None   Arthralgia of Left Ankle  Primary Localized Osteoarthritis of Left Knee  Left Knee Pain  Unspecified Atrial Fibrillation (Hcc)   Following with [facility] and vascular [facility] changed Xarelto eliquis but he prefers to revert to Xarelto 1x daily dosing   Chronic Neck Pain  Hyperlipidemia  Acute On Chronic Heart Failure (Hcc) (Resolved)  History of Covid-19 (Resolved)   Formatting of this note might be different from the original. Not requiring hospitalization Formatting of this note might be different from the original. Not requiring hospitalization   Aki (Acute Kidney Injury) (Hcc) (Resolved)   Lab Results Component Value Date CREATININE 0.97 03/03/2023 CREATININE 1.14 02/19/2023 CREATININE 1.29 (H) 02/18/2023   Localized Swelling of Right Lower Extremity (Resolved)  Localized Swelling of Left Lower Extremity (Resolved)  Sprain of Deltoid Ligament of Left Ankle (Resolved)  Situational Insomnia (Resolved)     Reviewed chart data: Active Ambulatory Problems    Diagnosis Date Noted    Plantar fasciitis of right foot 04/06/2013   Deformity of metatarsal 04/06/2013   Acquired equinus deformity of foot 04/06/2013   Chronic heart failure with preserved ejection fraction (HCC) 05/18/2021   Controlled type 2 diabetes mellitus without complication, without long-term current use of insulin (HCC) 01/23/2023   Unspecified atrial fibrillation (HCC) 08/20/2017   HTN (hypertension) 01/23/2023   Hypothyroidism 01/23/2023   Gout 01/23/2023   Obesity, Class III, BMI 40-49.9 (morbid obesity) (HCC) 01/25/2023   OSA (obstructive sleep apnea) 01/18/2020   IDA (iron deficiency anemia) 04/08/2021   Kidney injury 03/07/2023   Vertigo 03/07/2023   High risk for readmission 03/09/2023   Hyperlipidemia 08/26/2014   Erectile dysfunction 01/06/2018   History of stroke 03/09/2023   Drug-induced hypokalemia 03/09/2023   Alcohol use 05/18/2021   Cervical spondylosis 10/10/2021   Chronic neck pain 09/14/2014   Depression 05/18/2021   Diverticulosis of large intestine without hemorrhage 10/16/2014   Edema of both lower extremities 01/09/2023   Erectile dysfunction due to arterial insufficiency 08/26/2014   Idiopathic chronic gout 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 08/22/2020   Large liver 05/18/2021   H/O medication noncompliance 05/18/2021   LVH (left ventricular hypertrophy) 04/08/2021   Lymphedema 08/22/2020   Mitral valve regurgitation 05/18/2021   Prediabetes 01/08/2023   Severe obesity (BMI 35.0-39.9) with comorbidity (HCC) 10/30/2018   Transaminitis 01/11/2020   Tubular adenoma of colon 10/16/2014   Arthralgia of left ankle 07/06/2019   Left knee pain 10/23/2018   Hypomagnesemia 04/08/2021   Positive cardiac stress test 06/28/2021   Primary localized osteoarthritis of left knee 01/18/2019   Tricuspid valve regurgitation 05/18/2021   Resolved Ambulatory Problems    Diagnosis Date Noted   AKI (acute kidney injury) (HCC)  05/05/2021   Diastolic CHF (HCC) 02/14/2023   Situational  insomnia 01/06/2018   Acute on chronic heart failure (HCC) 05/16/2022   History of COVID-19 05/18/2021   Localized swelling of left lower extremity 01/11/2020   Localized swelling of right lower extremity 04/24/2020   Sprain of deltoid ligament of left ankle 08/10/2019   Past Medical History:  Diagnosis Date   (HFpEF) heart failure with preserved ejection fraction (HCC)    Arthritis    Cataract    CHF (congestive heart failure) (HCC)    Diabetes mellitus without complication (HCC)    Dyspnea    Dysrhythmia    GERD (gastroesophageal reflux disease)    Hypertension    Muscle spasm    OSA on CPAP 03/07/2023   Peripheral edema    Plantar fasciitis    Sleep apnea     Outpatient Medications Prior to Visit  Medication Sig   acetaminophen (TYLENOL) 500 MG tablet Take 500-1,000 mg by mouth every 8 (eight) hours as needed for moderate pain.   albuterol (VENTOLIN HFA) 108 (90 Base) MCG/ACT inhaler Inhale 2 puffs into the lungs every 6 (six) hours as needed for shortness of breath.   allopurinol (ZYLOPRIM) 300 MG tablet Take 1 tablet (300 mg total) by mouth daily.   amiodarone (PACERONE) 200 MG tablet Take 2 tablets (400 mg total) by mouth 2 (two) times daily for 7 days, THEN 1 tablet (200 mg total) 2 (two) times daily for 14 days, THEN 1 tablet (200 mg total) daily.   augmented betamethasone dipropionate (DIPROLENE-AF) 0.05 % cream Apply topically 2 (two) times daily as needed.   colchicine 0.6 MG tablet Take 1 tablet (0.6 mg total) by mouth daily as needed (gout flares).   empagliflozin (JARDIANCE) 10 MG TABS tablet Take 1 tablet (10 mg total) by mouth daily.   fluticasone (FLONASE) 50 MCG/ACT nasal spray Place 2 sprays into both nostrils daily. (Patient taking differently: Place 2 sprays into both nostrils daily as needed for allergies.)   folic acid (FOLVITE) 1 MG tablet Take 1 mg by mouth daily as needed (low iron).   gabapentin (NEURONTIN) 300 MG capsule Take 1 capsule (300 mg total) by  mouth 2 (two) times daily.   levothyroxine (SYNTHROID) 50 MCG tablet Take 1 tablet (50 mcg total) by mouth daily. Needs to be adjusted every 3-6 months based on labwork if still taking amiodarone   meclizine (ANTIVERT) 25 MG tablet Take 25 mg by mouth 3 (three) times daily as needed for dizziness.   metoprolol succinate (TOPROL-XL) 50 MG 24 hr tablet Take 1 tablet (50 mg total) by mouth daily.   Multiple Vitamins-Minerals (MULTIVITAMIN WITH MINERALS) tablet Take 1 tablet by mouth daily.   potassium chloride (KLOR-CON) 10 MEQ tablet Take 1 tablet (10 mEq total) by mouth daily. Take with torsemide because torsemide lowers potassium   prednisoLONE acetate (PRED FORTE) 1 % ophthalmic suspension Place into the right eye.   predniSONE (DELTASONE) 20 MG tablet Take 2 pills for 3 days, 1 pill for 4 days start immediately at first signs of gout flare   rivaroxaban (XARELTO) 20 MG TABS tablet Take 1 tablet (20 mg total) by mouth daily with supper.   rosuvastatin (CRESTOR) 10 MG tablet Take 1 tablet (10 mg total) by mouth daily.   rosuvastatin (CRESTOR) 20 MG tablet Take 1 tablet (20 mg total) by mouth daily. Replaces 10 mg dose, can cause cramps   sacubitril-valsartan (ENTRESTO) 49-51 MG Take 1 tablet by mouth 2 (two) times  daily.   spironolactone (ALDACTONE) 25 MG tablet Take 1 tablet (25 mg total) by mouth daily.   tadalafil (CIALIS) 20 MG tablet Take 0.5-1 tablets (10-20 mg total) by mouth every other day as needed for erectile dysfunction. Do not take nitro with this.  Can kill you.  Must tell emergency room if taking.   torsemide (DEMADEX) 20 MG tablet Take 1 tablet (20 mg total) by mouth daily.   traZODone (DESYREL) 50 MG tablet Take 0.5 tablets (25 mg total) by mouth at bedtime as needed for sleep.   [DISCONTINUED] Semaglutide,0.25 or 0.5MG /DOS, (OZEMPIC, 0.25 OR 0.5 MG/DOSE,) 2 MG/3ML SOPN INJECT 0.25MG  INTO THE SKIN ONE TIME PER WEEK   No facility-administered medications prior to visit.          Clinical Data Analysis:   Physical Exam  BP 136/78 (BP Location: Right Arm, Patient Position: Sitting)   Pulse 67   Temp 97.9 F (36.6 C) (Temporal)   Ht 5\' 11"  (1.803 m)   Wt 270 lb 3.2 oz (122.6 kg)   SpO2 98%   BMI 37.69 kg/m  Wt Readings from Last 10 Encounters:  04/07/23 270 lb 3.2 oz (122.6 kg)  03/07/23 280 lb 6.4 oz (127.2 kg)  03/03/23 279 lb 6.4 oz (126.7 kg)  02/14/23 296 lb 9.6 oz (134.5 kg)  01/25/23 295 lb 3.2 oz (133.9 kg)  03/11/22 287 lb (130.2 kg)  08/06/13 247 lb (112 kg)  07/29/13 247 lb 9.6 oz (112.3 kg)  04/06/13 251 lb (113.9 kg)   Vital signs reviewed.  Nursing notes reviewed. Weight trend reviewed. Abnormalities and Problem-Specific physical exam findings:  truncal adiposity  General Appearance:  No acute distress appreciable.   Well-groomed, healthy-appearing male.  Well proportioned with no abnormal fat distribution.  Good muscle tone. Skin: Clear and well-hydrated. Pulmonary:  Normal work of breathing at rest, no respiratory distress apparent. SpO2: 98 %  Musculoskeletal: All extremities are intact. Diabetic foot exam done and documented on the detailed diabetic foot exam form.  Neurological:  Awake, alert, oriented, and engaged.  No obvious focal neurological deficits or cognitive impairments.  Sensorium seems unclouded.   Speech is clear and coherent with logical content. Psychiatric:  Appropriate mood, pleasant and cooperative demeanor, thoughtful and engaged during the exam   Additional Results Reviewed:     No results found for any visits on 04/07/23.  Recent Results (from the past 2160 hour(s))  CBC     Status: Abnormal   Collection Time: 01/23/23  5:10 PM  Result Value Ref Range   WBC 6.5 4.0 - 10.5 K/uL   RBC 5.48 4.22 - 5.81 MIL/uL   Hemoglobin 11.8 (L) 13.0 - 17.0 g/dL   HCT 65.7 (L) 84.6 - 96.2 %   MCV 67.9 (L) 80.0 - 100.0 fL   MCH 21.5 (L) 26.0 - 34.0 pg   MCHC 31.7 30.0 - 36.0 g/dL   RDW 95.2 (H) 84.1 - 32.4 %   Platelets  228 150 - 400 K/uL    Comment: REPEATED TO VERIFY   nRBC 0.0 0.0 - 0.2 %    Comment: Performed at [facility], [address], [place], [place] [address]  Troponin I (High Sensitivity)     Status: Abnormal   Collection Time: 01/23/23  5:10 PM  Result Value Ref Range   Troponin I (High Sensitivity) 35 (H) <18 ng/L    Comment: (NOTE) Elevated high sensitivity troponin I (hsTnI) values and significant  changes across serial measurements may  suggest ACS but many other  chronic and acute conditions are known to elevate hsTnI results.  Refer to the "Links" section for chest pain algorithms and additional  guidance. Performed at [facility], [address] [place], [place] [address]   Magnesium     Status: None   Collection Time: 01/23/23  5:10 PM  Result Value Ref Range   Magnesium 1.7 1.7 - 2.4 mg/dL    Comment: Performed at [facility], [address] [place], [place] [address]  Brain natriuretic peptide     Status: Abnormal   Collection Time: 01/23/23  5:10 PM  Result Value Ref Range   B Natriuretic Peptide 377.4 (H) 0.0 - 100.0 pg/mL    Comment: Performed at [facility], [address] [place], [place] [address]  Comprehensive metabolic panel     Status: Abnormal   Collection Time: 01/23/23  5:10 PM  Result Value Ref Range   Sodium 137 135 - 145 mmol/L   Potassium 3.6 3.5 - 5.1 mmol/L   Chloride 101 98 - 111 mmol/L   CO2 21 (L) 22 - 32 mmol/L   Glucose, Bld 102 (H) 70 - 99 mg/dL    Comment: Glucose reference range applies only to samples taken after fasting for at least 8 hours.   BUN 20 6 - 20 mg/dL   Creatinine, Ser 9.14 (H) 0.61 - 1.24 mg/dL   Calcium 8.8 (L) 8.9 - 10.3 mg/dL   Total Protein 7.4 6.5 - 8.1 g/dL   Albumin 3.6 3.5 - 5.0 g/dL   AST 24 15 - 41 U/L   ALT 12 0 - 44 U/L   Alkaline Phosphatase 60 38 - 126 U/L   Total Bilirubin 1.2 0.3 - 1.2 mg/dL   GFR, Estimated 58 (L) >60 mL/min    Comment: (NOTE) Calculated using the CKD-EPI  Creatinine Equation (2021)    Anion gap 15 5 - 15    Comment: Performed at [facility], [address], [place], [place] [address]  Troponin I (High Sensitivity)     Status: Abnormal   Collection Time: 01/23/23  8:37 PM  Result Value Ref Range   Troponin I (High Sensitivity) 31 (H) <18 ng/L    Comment: (NOTE) Elevated high sensitivity troponin I (hsTnI) values and significant  changes across serial measurements may suggest ACS but many other  chronic and acute conditions are known to elevate hsTnI results.  Refer to the "Links" section for chest pain algorithms and additional  guidance. Performed at [facility], [address], [place], [place] [address]   D-dimer, quantitative     Status: Abnormal   Collection Time: 01/23/23  8:45 PM  Result Value Ref Range   D-Dimer, Quant 10.31 (H) 0.00 - 0.50 ug/mL-FEU    Comment: (NOTE) At the manufacturer cut-off value of 0.5 g/mL FEU, this assay has a negative predictive value of 95-100%.This assay is intended for use in conjunction with a clinical pretest probability (PTP) assessment model to exclude pulmonary embolism (PE) and deep venous thrombosis (DVT) in outpatients suspected of PE or DVT. Results should be correlated with clinical presentation. Performed at [facility], [address], [place], [place] [address]   CBC     Status: Abnormal   Collection Time: 01/23/23  8:45 PM  Result Value Ref Range   WBC 7.0 4.0 - 10.5 K/uL   RBC 5.64 4.22 - 5.81 MIL/uL   Hemoglobin 12.4 (L) 13.0 -  17.0 g/dL   HCT 40.9 (L) 81.1 - 91.4 %   MCV 67.4 (L) 80.0 - 100.0 fL   MCH 22.0 (L) 26.0 - 34.0 pg   MCHC 32.6 30.0 - 36.0 g/dL   RDW 78.2 (H) 95.6 - 21.3 %   Platelets 231 150 - 400 K/uL    Comment: REPEATED TO VERIFY   nRBC 0.0 0.0 - 0.2 %    Comment: Performed at [facility], [address] [place], [place] [address]  Creatinine, serum     Status: Abnormal   Collection Time: 01/23/23  8:45 PM  Result Value Ref  Range   Creatinine, Ser 1.39 (H) 0.61 - 1.24 mg/dL   GFR, Estimated 59 (L) >60 mL/min    Comment: (NOTE) Calculated using the CKD-EPI Creatinine Equation (2021) Performed at [facility], [address], [place], [place] [address]   Basic metabolic panel     Status: Abnormal   Collection Time: 01/24/23  2:06 AM  Result Value Ref Range   Sodium 137 135 - 145 mmol/L   Potassium 3.4 (L) 3.5 - 5.1 mmol/L   Chloride 99 98 - 111 mmol/L   CO2 26 22 - 32 mmol/L   Glucose, Bld 110 (H) 70 - 99 mg/dL    Comment: Glucose reference range applies only to samples taken after fasting for at least 8 hours.   BUN 21 (H) 6 - 20 mg/dL   Creatinine, Ser 2.95 (H) 0.61 - 1.24 mg/dL   Calcium 8.8 (L) 8.9 - 10.3 mg/dL   GFR, Estimated >28 >41 mL/min    Comment: (NOTE) Calculated using the CKD-EPI Creatinine Equation (2021)    Anion gap 12 5 - 15    Comment: Performed at [facility], [address] [place], [place] [address]  Magnesium     Status: None   Collection Time: 01/24/23  2:06 AM  Result Value Ref Range   Magnesium 1.7 1.7 - 2.4 mg/dL    Comment: Performed at [facility], [address] [place], [place] [address]  CBC with Differential/Platelet     Status: Abnormal   Collection Time: 01/24/23  2:06 AM  Result Value Ref Range   WBC 6.0 4.0 - 10.5 K/uL   RBC 5.63 4.22 - 5.81 MIL/uL   Hemoglobin 12.2 (L) 13.0 - 17.0 g/dL   HCT 53.6 (L) 64.4 - 03.4 %   MCV 67.7 (L) 80.0 - 100.0 fL   MCH 21.7 (L) 26.0 - 34.0 pg   MCHC 32.0 30.0 - 36.0 g/dL   RDW 74.2 (H) 59.5 - 63.8 %   Platelets 215 150 - 400 K/uL    Comment: REPEATED TO VERIFY   nRBC 0.0 0.0 - 0.2 %   Neutrophils Relative % 58 %   Neutro Abs 3.5 1.7 - 7.7 K/uL   Lymphocytes Relative 30 %   Lymphs Abs 1.8 0.7 - 4.0 K/uL   Monocytes Relative 4 %   Monocytes Absolute 0.2 0.1 - 1.0 K/uL   Eosinophils Relative 4 %   Eosinophils Absolute 0.2 0.0 - 0.5 K/uL   Basophils Relative 4 %   Basophils Absolute 0.2 (H) 0.0 -  0.1 K/uL   nRBC 0 0 /100 WBC   Abs Immature Granulocytes 0.00 0.00 - 0.07 K/uL   Polychromasia PRESENT     Comment: Performed at [facility], [address] [place], [place] [address]  HIV Antibody (routine testing w rflx)  Status: None   Collection Time: 01/24/23  2:06 AM  Result Value Ref Range   HIV Screen 4th Generation wRfx Non Reactive Non Reactive    Comment: Performed at [facility], [address] [place], [place] [address]  TSH     Status: None   Collection Time: 01/24/23  4:47 AM  Result Value Ref Range   TSH 3.562 0.350 - 4.500 uIU/mL    Comment: Performed by a 3rd Generation assay with a functional sensitivity of <=0.01 uIU/mL. Performed at [facility], [address] [place], [place] [address]   ECHOCARDIOGRAM COMPLETE     Status: None   Collection Time: 01/24/23  2:02 PM  Result Value Ref Range   Weight 4,761.94 oz   Height 71 in   BP 121/80 mmHg   S' Lateral 3.40 cm   Est EF 55 - 60%   Hemoglobin A1c     Status: Abnormal   Collection Time: 01/25/23 12:48 AM  Result Value Ref Range   Hgb A1c MFr Bld 6.1 (H) 4.8 - 5.6 %    Comment: (NOTE) Pre diabetes:          5.7%-6.4%  Diabetes:              >6.4%  Glycemic control for   <7.0% adults with diabetes    Mean Plasma Glucose 128.37 mg/dL    Comment: Performed at [facility], [address], [place], [place] [address]  Basic metabolic panel     Status: Abnormal   Collection Time: 01/25/23 12:48 AM  Result Value Ref Range   Sodium 135 135 - 145 mmol/L   Potassium 3.8 3.5 - 5.1 mmol/L   Chloride 100 98 - 111 mmol/L   CO2 23 22 - 32 mmol/L   Glucose, Bld 173 (H) 70 - 99 mg/dL    Comment: Glucose reference range applies only to samples taken after fasting for at least 8 hours.   BUN 23 (H) 6 - 20 mg/dL   Creatinine, Ser 4.13 0.61 - 1.24 mg/dL   Calcium 9.0 8.9 - 24.4 mg/dL   GFR, Estimated >01 >02 mL/min    Comment: (NOTE) Calculated using the CKD-EPI Creatinine Equation  (2021)    Anion gap 12 5 - 15    Comment: Performed at [facility], [address] [place], [place] [address]  Comprehensive Metabolic Panel (CMET)     Status: Abnormal   Collection Time: 02/14/23 11:06 AM  Result Value Ref Range   Sodium 139 135 - 145 mmol/L   Potassium 3.3 (L) 3.5 - 5.1 mmol/L   Chloride 100 98 - 111 mmol/L   CO2 29 22 - 32 mmol/L   Glucose, Bld 113 (H) 70 - 99 mg/dL    Comment: Glucose reference range applies only to samples taken after fasting for at least 8 hours.   BUN 7 6 - 20 mg/dL   Creatinine, Ser 6.44 0.61 - 1.24 mg/dL   Calcium 8.7 (L) 8.9 - 10.3 mg/dL   Total Protein 7.2 6.5 - 8.1 g/dL   Albumin 3.5 3.5 - 5.0 g/dL   AST 31 15 - 41 U/L   ALT 15 0 - 44 U/L   Alkaline Phosphatase 54 38 - 126 U/L   Total Bilirubin 1.1 0.3 - 1.2 mg/dL   GFR, Estimated >03 >47 mL/min    Comment: (NOTE) Calculated using the CKD-EPI Creatinine Equation (2021)    Anion gap 10 5 - 15  Comment: Performed at [facility], [address], [place], [place] [address]  CBC     Status: Abnormal   Collection Time: 02/14/23 11:06 AM  Result Value Ref Range   WBC 5.0 4.0 - 10.5 K/uL   RBC 5.69 4.22 - 5.81 MIL/uL   Hemoglobin 12.2 (L) 13.0 - 17.0 g/dL   HCT 84.6 (L) 96.2 - 95.2 %   MCV 67.3 (L) 80.0 - 100.0 fL   MCH 21.4 (L) 26.0 - 34.0 pg   MCHC 31.9 30.0 - 36.0 g/dL   RDW 84.1 (H) 32.4 - 40.1 %   Platelets 199 150 - 400 K/uL    Comment: REPEATED TO VERIFY   nRBC 0.0 0.0 - 0.2 %    Comment: Performed at [facility], [address], [place], [place] [address]  B Nat Peptide     Status: Abnormal   Collection Time: 02/14/23 11:06 AM  Result Value Ref Range   B Natriuretic Peptide 248.5 (H) 0.0 - 100.0 pg/mL    Comment: Performed at [facility], [address] [place], [place] [address]  TSH     Status: None   Collection Time: 02/14/23 11:06 AM  Result Value Ref Range   TSH 3.423 0.350 - 4.500 uIU/mL    Comment: Performed by a 3rd Generation  assay with a functional sensitivity of <=0.01 uIU/mL. Performed at [facility], [address], [place], [place] [address]   Lactic acid, plasma     Status: None   Collection Time: 02/14/23 11:06 AM  Result Value Ref Range   Lactic Acid, Venous 1.6 0.5 - 1.9 mmol/L    Comment: Performed at [facility], [address], [place], [place] [address]  MRSA Next Gen by PCR, Nasal     Status: Abnormal   Collection Time: 02/14/23  2:45 PM   Specimen: Nasal Mucosa; Nasal Swab  Result Value Ref Range   MRSA by PCR Next Gen DETECTED (A) NOT DETECTED    Comment: RESULT CALLED TO, READ BACK BY AND VERIFIED WITH: RN EMILY HUDSON ON 02/14/23 @ 1844 BY DRT (NOTE) The GeneXpert MRSA Assay (FDA approved for NASAL specimens only), is one component of a comprehensive MRSA colonization surveillance program. It is not intended to diagnose MRSA infection nor to guide or monitor treatment for MRSA infections. Test performance is not FDA approved in patients less than 74 years old. Performed at [facility], [address], [place], [place] [address]   Cooxemetry Panel (carboxy, met, total hgb, O2 sat)     Status: Abnormal   Collection Time: 02/15/23  5:14 AM  Result Value Ref Range   Total hemoglobin 13.0 12.0 - 16.0 g/dL   O2 Saturation 43.3 %   Carboxyhemoglobin 1.8 (H) 0.5 - 1.5 %   Methemoglobin <0.7 0.0 - 1.5 %    Comment: Performed at [facility], [address] [place], [place] [address]  Basic metabolic panel     Status: Abnormal   Collection Time: 02/15/23  5:14 AM  Result Value Ref Range   Sodium 138 135 - 145 mmol/L   Potassium 3.2 (L) 3.5 - 5.1 mmol/L   Chloride 98 98 - 111 mmol/L   CO2 28 22 - 32 mmol/L   Glucose, Bld 112 (H) 70 - 99 mg/dL    Comment: Glucose reference range applies only to samples taken after fasting for at least 8 hours.   BUN 9 6 -  20 mg/dL   Creatinine, Ser 8.11 0.61 - 1.24 mg/dL   Calcium 8.9 8.9 - 91.4 mg/dL   GFR, Estimated >78 >29  mL/min    Comment: (NOTE) Calculated using the CKD-EPI Creatinine Equation (2021)    Anion gap 12 5 - 15    Comment: Performed at [facility], [address] [place], [place] [address]  CBC     Status: Abnormal   Collection Time: 02/15/23  5:14 AM  Result Value Ref Range   WBC 5.2 4.0 - 10.5 K/uL   RBC 5.71 4.22 - 5.81 MIL/uL   Hemoglobin 12.6 (L) 13.0 - 17.0 g/dL   HCT 08.6 (L) 57.8 - 46.9 %   MCV 66.7 (L) 80.0 - 100.0 fL   MCH 22.1 (L) 26.0 - 34.0 pg   MCHC 33.1 30.0 - 36.0 g/dL   RDW 62.9 (H) 52.8 - 41.3 %   Platelets 180 150 - 400 K/uL    Comment: REPEATED TO VERIFY   nRBC 0.0 0.0 - 0.2 %    Comment: Performed at [facility], [address], [place], [place] [address]  Cooxemetry Panel (carboxy, met, total hgb, O2 sat)     Status: Abnormal   Collection Time: 02/16/23  5:00 AM  Result Value Ref Range   Total hemoglobin 13.5 12.0 - 16.0 g/dL   O2 Saturation 62 %   Carboxyhemoglobin 2.0 (H) 0.5 - 1.5 %   Methemoglobin <0.7 0.0 - 1.5 %    Comment: Performed at [facility], [address] [place], [place] [address]  Basic metabolic panel     Status: Abnormal   Collection Time: 02/16/23  5:25 AM  Result Value Ref Range   Sodium 135 135 - 145 mmol/L   Potassium 3.6 3.5 - 5.1 mmol/L   Chloride 94 (L) 98 - 111 mmol/L   CO2 27 22 - 32 mmol/L   Glucose, Bld 120 (H) 70 - 99 mg/dL    Comment: Glucose reference range applies only to samples taken after fasting for at least 8 hours.   BUN 17 6 - 20 mg/dL   Creatinine, Ser 3.66 0.61 - 1.24 mg/dL   Calcium 8.9 8.9 - 44.0 mg/dL   GFR, Estimated >34 >74 mL/min    Comment: (NOTE) Calculated using the CKD-EPI Creatinine Equation (2021)    Anion gap 14 5 - 15    Comment: Performed at [facility], [address] [place], [place] [address]  CBC     Status: Abnormal   Collection Time: 02/16/23  5:25 AM  Result Value Ref Range   WBC 5.1 4.0 - 10.5 K/uL   RBC 6.12 (H) 4.22 - 5.81 MIL/uL   Hemoglobin 13.4  13.0 - 17.0 g/dL   HCT 38.7 56.4 - 33.2 %   MCV 67.3 (L) 80.0 - 100.0 fL   MCH 21.9 (L) 26.0 - 34.0 pg   MCHC 32.5 30.0 - 36.0 g/dL   RDW 95.1 (H) 88.4 - 16.6 %   Platelets 158 150 - 400 K/uL    Comment: REPEATED TO VERIFY   nRBC 0.0 0.0 - 0.2 %    Comment: Performed at [facility], [address], [place], [place] [address]  Magnesium     Status: None   Collection Time: 02/16/23  5:25 AM  Result Value Ref Range   Magnesium 1.7 1.7 - 2.4 mg/dL    Comment: Performed at [facility], [address]  [place], [place] [address]  Vitamin B12     Status: None   Collection Time: 02/16/23 12:18 PM  Result Value Ref Range   Vitamin B-12 295 180 - 914 pg/mL    Comment: (NOTE) This assay is not validated for testing neonatal or myeloproliferative syndrome specimens for Vitamin B12 levels. Performed at [facility], [address], [place], [place] [address]   Folate     Status: None   Collection Time: 02/16/23 12:18 PM  Result Value Ref Range   Folate 12.2 >5.9 ng/mL    Comment: Performed at [facility], [address], [place], [place] [address]  Iron and TIBC     Status: Abnormal   Collection Time: 02/16/23 12:18 PM  Result Value Ref Range   Iron 37 (L) 45 - 182 ug/dL   TIBC 213 (H) 086 - 578 ug/dL   Saturation Ratios 7 (L) 17.9 - 39.5 %   UIBC 502 ug/dL    Comment: Performed at [facility], [address], [place], [place] [address]  Ferritin     Status: None   Collection Time: 02/16/23 12:18 PM  Result Value Ref Range   Ferritin 24 24 - 336 ng/mL    Comment: Performed at [facility], [address], [place], [place] [address]  Reticulocytes     Status: Abnormal   Collection Time: 02/16/23 12:18 PM  Result Value Ref Range   Retic Ct Pct 1.8 0.4 - 3.1 %   RBC. 6.58 (H) 4.22 - 5.81 MIL/uL   Retic Count, Absolute 115.8 19.0 - 186.0 K/uL   Immature Retic Fract 24.0 (H) 2.3 - 15.9 %    Comment: Performed at [facility], [address] [place], [place] [address]  Cooxemetry Panel (carboxy, met, total hgb, O2 sat)     Status: Abnormal   Collection Time: 02/17/23  3:30 AM  Result Value Ref Range   Total hemoglobin 14.8 12.0 - 16.0 g/dL   O2 Saturation 01.0 %   Carboxyhemoglobin 2.3 (H) 0.5 - 1.5 %   Methemoglobin <0.7 0.0 - 1.5 %    Comment: Performed at [facility], [address] [place], [place] [address]  Basic metabolic panel     Status: Abnormal   Collection Time: 02/17/23  3:30 AM  Result Value Ref Range   Sodium 129 (L) 135 - 145 mmol/L   Potassium 4.0 3.5 - 5.1 mmol/L   Chloride 93 (L) 98 - 111 mmol/L   CO2 26 22 - 32 mmol/L   Glucose, Bld 208 (H) 70 - 99 mg/dL    Comment: Glucose reference range applies only to samples taken after fasting for at least 8 hours.   BUN 24 (H) 6 - 20 mg/dL   Creatinine, Ser 6.64 (H) 0.61 - 1.24 mg/dL   Calcium 9.1 8.9 - 40.3 mg/dL   GFR, Estimated >47 >42 mL/min    Comment: (NOTE) Calculated using the CKD-EPI Creatinine Equation (2021)    Anion gap 10 5 - 15    Comment: Performed at [facility], [address], [place], [place] [address]  CBC     Status: Abnormal   Collection Time: 02/17/23  3:30 AM  Result Value Ref Range   WBC 8.2 4.0 - 10.5 K/uL   RBC 6.60 (H) 4.22 - 5.81 MIL/uL   Hemoglobin 14.4 13.0 - 17.0 g/dL   HCT 87.5 64.3 - 32.9 %   MCV 66.2 (L)  80.0 - 100.0 fL   MCH 21.8 (L) 26.0 - 34.0 pg   MCHC 33.0 30.0 - 36.0 g/dL   RDW 16.1 (H) 09.6 - 04.5 %   Platelets 210 150 - 400 K/uL    Comment: REPEATED TO VERIFY   nRBC 0.0 0.0 - 0.2 %    Comment: Performed at [facility], [address], [place], [place] [address]  Magnesium     Status: None   Collection Time: 02/17/23  3:30 AM  Result Value Ref Range   Magnesium 1.7 1.7 - 2.4 mg/dL    Comment: Performed at [facility], [address], [place], [place] [address]  POCT I-Stat EG7     Status: Abnormal   Collection Time: 02/17/23  1:22 PM  Result Value Ref Range   pH, Ven  7.427 7.25 - 7.43   pCO2, Ven 41.4 (L) 44 - 60 mmHg   pO2, Ven 36 32 - 45 mmHg   Bicarbonate 27.3 20.0 - 28.0 mmol/L   TCO2 29 22 - 32 mmol/L   O2 Saturation 71 %   Acid-Base Excess 3.0 (H) 0.0 - 2.0 mmol/L   Sodium 135 135 - 145 mmol/L   Potassium 4.0 3.5 - 5.1 mmol/L   Calcium, Ion 1.21 1.15 - 1.40 mmol/L   HCT 53.0 (H) 39.0 - 52.0 %   Hemoglobin 18.0 (H) 13.0 - 17.0 g/dL   Sample type VENOUS    Comment NOTIFIED PHYSICIAN   POCT I-Stat EG7     Status: Abnormal   Collection Time: 02/17/23  1:22 PM  Result Value Ref Range   pH, Ven 7.428 7.25 - 7.43   pCO2, Ven 42.3 (L) 44 - 60 mmHg   pO2, Ven 35 32 - 45 mmHg   Bicarbonate 27.9 20.0 - 28.0 mmol/L   TCO2 29 22 - 32 mmol/L   O2 Saturation 69 %   Acid-Base Excess 3.0 (H) 0.0 - 2.0 mmol/L   Sodium 135 135 - 145 mmol/L   Potassium 4.1 3.5 - 5.1 mmol/L   Calcium, Ion 1.23 1.15 - 1.40 mmol/L   HCT 53.0 (H) 39.0 - 52.0 %   Hemoglobin 18.0 (H) 13.0 - 17.0 g/dL   Sample type VENOUS    Comment NOTIFIED PHYSICIAN   POCT I-Stat EG7     Status: Abnormal   Collection Time: 02/17/23  1:26 PM  Result Value Ref Range   pH, Ven 7.413 7.25 - 7.43   pCO2, Ven 45.0 44 - 60 mmHg   pO2, Ven 31 (LL) 32 - 45 mmHg   Bicarbonate 28.7 (H) 20.0 - 28.0 mmol/L   TCO2 30 22 - 32 mmol/L   O2 Saturation 61 %   Acid-Base Excess 3.0 (H) 0.0 - 2.0 mmol/L   Sodium 136 135 - 145 mmol/L   Potassium 4.1 3.5 - 5.1 mmol/L   Calcium, Ion 1.20 1.15 - 1.40 mmol/L   HCT 52.0 39.0 - 52.0 %   Hemoglobin 17.7 (H) 13.0 - 17.0 g/dL   Sample type VENOUS    Comment NOTIFIED PHYSICIAN   POCT I-Stat EG7     Status: Abnormal   Collection Time: 02/17/23  1:30 PM  Result Value Ref Range   pH, Ven 7.422 7.25 - 7.43   pCO2, Ven 43.2 (L) 44 - 60 mmHg   pO2, Ven 33 32 - 45 mmHg   Bicarbonate 28.1 (H) 20.0 - 28.0 mmol/L   TCO2 29 22 - 32 mmol/L   O2 Saturation 65 %  Acid-Base Excess 3.0 (H) 0.0 - 2.0 mmol/L   Sodium 137 135 - 145 mmol/L   Potassium 3.9 3.5 - 5.1 mmol/L    Calcium, Ion 1.14 (L) 1.15 - 1.40 mmol/L   HCT 52.0 39.0 - 52.0 %   Hemoglobin 17.7 (H) 13.0 - 17.0 g/dL   Sample type VENOUS   POCT I-Stat EG7     Status: Abnormal   Collection Time: 02/17/23  1:31 PM  Result Value Ref Range   pH, Ven 7.424 7.25 - 7.43   pCO2, Ven 41.7 (L) 44 - 60 mmHg   pO2, Ven 36 32 - 45 mmHg   Bicarbonate 27.3 20.0 - 28.0 mmol/L   TCO2 29 22 - 32 mmol/L   O2 Saturation 70 %   Acid-Base Excess 2.0 0.0 - 2.0 mmol/L   Sodium 136 135 - 145 mmol/L   Potassium 4.0 3.5 - 5.1 mmol/L   Calcium, Ion 1.24 1.15 - 1.40 mmol/L   HCT 53.0 (H) 39.0 - 52.0 %   Hemoglobin 18.0 (H) 13.0 - 17.0 g/dL   Sample type VENOUS   Cooxemetry Panel (carboxy, met, total hgb, O2 sat)     Status: Abnormal   Collection Time: 02/18/23  4:10 AM  Result Value Ref Range   Total hemoglobin 14.1 12.0 - 16.0 g/dL   O2 Saturation 96.0 %   Carboxyhemoglobin 1.8 (H) 0.5 - 1.5 %   Methemoglobin <0.7 0.0 - 1.5 %    Comment: Performed at [facility], [address] [place], [place] [address]  Basic metabolic panel     Status: Abnormal   Collection Time: 02/18/23  4:10 AM  Result Value Ref Range   Sodium 131 (L) 135 - 145 mmol/L   Potassium 3.2 (L) 3.5 - 5.1 mmol/L   Chloride 88 (L) 98 - 111 mmol/L   CO2 24 22 - 32 mmol/L   Glucose, Bld 356 (H) 70 - 99 mg/dL    Comment: Glucose reference range applies only to samples taken after fasting for at least 8 hours.   BUN 32 (H) 6 - 20 mg/dL   Creatinine, Ser 8.11 (H) 0.61 - 1.24 mg/dL   Calcium 8.7 (L) 8.9 - 10.3 mg/dL   GFR, Estimated >91 >47 mL/min    Comment: (NOTE) Calculated using the CKD-EPI Creatinine Equation (2021)    Anion gap 19 (H) 5 - 15    Comment: Performed at [facility], [address], [place], [place] [address]  CBC     Status: Abnormal   Collection Time: 02/18/23  4:10 AM  Result Value Ref Range   WBC 12.5 (H) 4.0 - 10.5 K/uL   RBC 6.34 (H) 4.22 - 5.81 MIL/uL   Hemoglobin 13.9 13.0 - 17.0 g/dL   HCT 21.3 08.6  - 57.8 %   MCV 67.0 (L) 80.0 - 100.0 fL   MCH 21.9 (L) 26.0 - 34.0 pg   MCHC 32.7 30.0 - 36.0 g/dL   RDW 46.9 (H) 62.9 - 52.8 %   Platelets 212 150 - 400 K/uL    Comment: REPEATED TO VERIFY   nRBC 0.0 0.0 - 0.2 %    Comment: Performed at [facility], [address], [place], [place] [address]  Magnesium     Status: Abnormal   Collection Time: 02/18/23  4:10 AM  Result Value Ref Range   Magnesium 2.6 (H) 1.7 - 2.4 mg/dL    Comment: Performed at [facility], [address], [place], [place] [address]  Basic metabolic panel     Status: Abnormal   Collection Time: 02/19/23  5:00 AM  Result Value Ref Range   Sodium 132 (L) 135 - 145 mmol/L   Potassium 4.1 3.5 - 5.1 mmol/L   Chloride 98 98 - 111 mmol/L   CO2 23 22 - 32 mmol/L   Glucose, Bld 225 (H) 70 - 99 mg/dL    Comment: Glucose reference range applies only to samples taken after fasting for at least 8 hours.   BUN 32 (H) 6 - 20 mg/dL   Creatinine, Ser 4.94 0.61 - 1.24 mg/dL   Calcium 8.7 (L) 8.9 - 10.3 mg/dL   GFR, Estimated >49 >67 mL/min    Comment: (NOTE) Calculated using the CKD-EPI Creatinine Equation (2021)    Anion gap 11 5 - 15    Comment: Performed at [facility], [address], [place], [place] [address]  CBC     Status: Abnormal   Collection Time: 02/19/23  5:00 AM  Result Value Ref Range   WBC 12.4 (H) 4.0 - 10.5 K/uL   RBC 6.44 (H) 4.22 - 5.81 MIL/uL   Hemoglobin 14.1 13.0 - 17.0 g/dL   HCT 84.6 65.9 - 93.5 %   MCV 66.9 (L) 80.0 - 100.0 fL   MCH 21.9 (L) 26.0 - 34.0 pg   MCHC 32.7 30.0 - 36.0 g/dL   RDW 70.1 (H) 77.9 - 39.0 %   Platelets 227 150 - 400 K/uL   nRBC 0.0 0.0 - 0.2 %    Comment: Performed at [facility], [address], [place], [place] [address]  Cooxemetry Panel (carboxy, met, total hgb, O2 sat)     Status: Abnormal   Collection Time: 02/19/23  5:25 AM  Result Value Ref Range   Total hemoglobin 14.0 12.0 - 16.0 g/dL   O2 Saturation 33.0 %   Carboxyhemoglobin 3.0  (H) 0.5 - 1.5 %   Methemoglobin <0.7 0.0 - 1.5 %    Comment: Performed at [facility], [address], [place], [place] [address]  Protime-INR     Status: Abnormal   Collection Time: 02/19/23  8:15 AM  Result Value Ref Range   Prothrombin Time 15.9 (H) 11.4 - 15.2 seconds   INR 1.3 (H) 0.8 - 1.2    Comment: (NOTE) INR goal varies based on device and disease states. Performed at [facility], [address] [place], [place] [address]   ECHO TEE     Status: None   Collection Time: 02/19/23  9:54 AM  Result Value Ref Range   Est EF 55 - 60%   Glucose, capillary     Status: Abnormal   Collection Time: 02/19/23 10:55 AM  Result Value Ref Range   Glucose-Capillary 102 (H) 70 - 99 mg/dL    Comment: Glucose reference range applies only to samples taken after fasting for at least 8 hours.  CBC     Status: Abnormal   Collection Time: 03/03/23  2:39 PM  Result Value Ref Range   WBC 5.6 4.0 - 10.5 K/uL   RBC 7.01 (H) 4.22 - 5.81 MIL/uL   Hemoglobin 15.4 13.0 - 17.0 g/dL   HCT 45.6 25.6 - 38.9 %   MCV 66.8 (L) 80.0 - 100.0 fL   MCH 22.0 (L) 26.0 - 34.0 pg   MCHC 32.9 30.0 - 36.0 g/dL   RDW 37.3 (H) 42.8 - 76.8 %   Platelets  225 150 - 400 K/uL    Comment: REPEATED TO VERIFY   nRBC 0.0 0.0 - 0.2 %    Comment: Performed at [facility], [address], [place], [place] [address]  B Nat Peptide     Status: None   Collection Time: 03/03/23  2:39 PM  Result Value Ref Range   B Natriuretic Peptide 98.1 0.0 - 100.0 pg/mL    Comment: Performed at [facility], [address] [place], [place] [address]  Basic Metabolic Panel (BMET)     Status: Abnormal   Collection Time: 03/03/23  2:39 PM  Result Value Ref Range   Sodium 138 135 - 145 mmol/L   Potassium 3.6 3.5 - 5.1 mmol/L   Chloride 102 98 - 111 mmol/L   CO2 23 22 - 32 mmol/L   Glucose, Bld 108 (H) 70 - 99 mg/dL    Comment: Glucose reference range applies only to samples taken after fasting for at least 8  hours.   BUN 11 6 - 20 mg/dL   Creatinine, Ser 0.98 0.61 - 1.24 mg/dL   Calcium 9.0 8.9 - 11.9 mg/dL   GFR, Estimated >14 >78 mL/min    Comment: (NOTE) Calculated using the CKD-EPI Creatinine Equation (2021)    Anion gap 13 5 - 15    Comment: Performed at [facility], [address] [place], [place] [address]  Microalbumin / creatinine urine ratio     Status: None   Collection Time: 03/07/23  2:55 PM  Result Value Ref Range   Microalb, Ur <0.7 0.0 - 1.9 mg/dL   Creatinine,U 13.0 mg/dL   Microalb Creat Ratio 4.6 0.0 - 30.0 mg/g  Basic Metabolic Panel (BMET)     Status: Abnormal   Collection Time: 03/12/23 12:10 PM  Result Value Ref Range   Sodium 135 135 - 145 mmol/L   Potassium 4.1 3.5 - 5.1 mmol/L   Chloride 100 98 - 111 mmol/L   CO2 21 (L) 22 - 32 mmol/L   Glucose, Bld 103 (H) 70 - 99 mg/dL    Comment: Glucose reference range applies only to samples taken after fasting for at least 8 hours.   BUN 13 6 - 20 mg/dL   Creatinine, Ser 8.65 0.61 - 1.24 mg/dL   Calcium 9.6 8.9 - 78.4 mg/dL   GFR, Estimated >69 >62 mL/min    Comment: (NOTE) Calculated using the CKD-EPI Creatinine Equation (2021)    Anion gap 14 5 - 15    Comment: Performed at [facility], [address] [place], [place] [address]    No image results found.   ECHO TEE  Result Date: 02/19/2023    TRANSESOPHOGEAL ECHO REPORT   Patient Name:   MARX GERSHON Date of Exam: 02/19/2023 Medical Rec #:  [id number]      Height:       71.0 in Accession #:    [id number]     Weight:       275.8 lb Date of Birth:  Jul 15, 1966     BSA:          2.417 m Patient Age:    56 years       BP:           123/98 mmHg Patient Gender: M              HR:           120 bpm. Exam Location:  Inpatient Procedure:  Transesophageal Echo, Color Doppler and Cardiac Doppler Indications:     I48.91* Unspecified atrial fibrillation  History:         Patient has prior history of Echocardiogram examinations.                  Arrythmias:Atrial  Fibrillation; Risk Factors:Hypertension and                  Diabetes.  Sonographer:     Irving Burton Senior RDCS Referring Phys:  [id number] Bevelyn Buckles BENSIMHON Diagnosing Phys: Arvilla Meres MD PROCEDURE: After discussion of the risks and benefits of a TEE, an informed consent was obtained from the patient. The transesophogeal probe was passed without difficulty through the esophogus of the patient. Sedation performed by different physician. The patient was monitored while under deep sedation. Anesthestetic sedation was provided intravenously by Anesthesiology: 165mg  of Propofol. The patient developed no complications during the procedure.  IMPRESSIONS  1. Left ventricular ejection fraction, by estimation, is 55 to 60%. The left ventricle has normal function.  2. Right ventricular systolic function is severely reduced. The right ventricular size is mildly enlarged.  3. Left atrial size was moderately dilated. No left atrial/left atrial appendage thrombus was detected.  4. Right atrial size was severely dilated.  5. The mitral valve is normal in structure. Mild to moderate mitral valve regurgitation.  6. Tricuspid valve regurgitation is moderate to severe.  7. The aortic valve is tricuspid. Aortic valve regurgitation is not visualized. No aortic stenosis is present. Conclusion(s)/Recommendation(s): No LA/LAA thrombus identified. Successful cardioversion performed with restoration of normal sinus rhythm. FINDINGS  Left Ventricle: Left ventricular ejection fraction, by estimation, is 55 to 60%. The left ventricle has normal function. The left ventricular internal cavity size was normal in size. Right Ventricle: The right ventricular size is mildly enlarged. No increase in right ventricular wall thickness. Right ventricular systolic function is severely reduced. Left Atrium: Left atrial size was moderately dilated. No left atrial/left atrial appendage thrombus was detected. Right Atrium: Right atrial size was severely dilated.  Pericardium: There is no evidence of pericardial effusion. Mitral Valve: The mitral valve is normal in structure. Mild to moderate mitral valve regurgitation. Tricuspid Valve: The tricuspid valve is normal in structure. Tricuspid valve regurgitation is moderate to severe. Aortic Valve: The aortic valve is tricuspid. Aortic valve regurgitation is not visualized. No aortic stenosis is present. Pulmonic Valve: The pulmonic valve was normal in structure. Pulmonic valve regurgitation is trivial. Aorta: The aortic root is normal in size and structure. IAS/Shunts: No atrial level shunt detected by color flow Doppler. Additional Comments: Spectral Doppler performed. Arvilla Meres MD Electronically signed by Arvilla Meres MD Signature Date/Time: 02/19/2023/1:35:32 PM    Final    CARDIAC CATHETERIZATION  Result Date: 02/17/2023 Findings: RA = 15 RV = 41/18 PA =  44/23 (33) PCW = 19 Fick cardiac output/index = 6.6/2.7 PVR = 2.2 WU Ao sat = 95% PA sat = 70%, 69% RA sat = 70% SVC sat = 65% PAPi = 1.4 Assessment: 1. 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 Will need sleep study. Arvilla Meres, MD 3:05 PM  DG Chest Port 1 View  Result Date: 02/14/2023 CLINICAL DATA:  PICC line placement EXAM: PORTABLE CHEST 1 VIEW COMPARISON:  01/23/2023 FINDINGS: Single frontal view of the chest demonstrates right-sided PICC tip overlying superior vena cava. Cardiac silhouette is enlarged stable. No airspace disease, effusion, or pneumothorax. No acute bony abnormality. IMPRESSION: 1. Right-sided  PICC tip overlying superior vena cava. 2. Stable enlarged cardiac silhouette. Electronically Signed   By: Sharlet Salina M.D.   On: 02/14/2023 18:55   [place] EKG SITE RITE  Result Date: 02/14/2023 If Site Rite image not attached, placement could not be confirmed due to current cardiac rhythm.  ECHOCARDIOGRAM COMPLETE  Result Date: 01/24/2023    ECHOCARDIOGRAM REPORT    Patient Name:   TYNE FINERTY Date of Exam: 01/24/2023 Medical Rec #:  [id number]      Height:       71.0 in Accession #:    [id number]     Weight:       297.6 lb Date of Birth:  [birth year]     BSA:          2.497 m Patient Age:    56 years       BP:           121/80 mmHg Patient Gender: M              HR:           109 bpm. Exam Location:  Inpatient Procedure: 2D Echo Indications:     acute diastolic chf  History:         Patient has no prior history of Echocardiogram examinations.                  Arrythmias:Atrial Fibrillation.  Sonographer:     Delcie Roch RDCS Referring Phys:  [phone] DEBBY CROSLEY Diagnosing Phys: Laurance Flatten MD IMPRESSIONS  1. Right ventricular systolic function is moderately reduced. The right ventricular size is severely enlarged. There is moderately elevated pulmonary artery systolic pressure. The estimated right ventricular systolic pressure is 53.9 mmHg. Unknown chronicity of RV failure, but findings concerning for possible PE and recommend further work-up if clinically indicated.  2. Left ventricular ejection fraction, by estimation, is 55 to 60%. The left ventricle has normal function. The left ventricle has no regional wall motion abnormalities. There is mild concentric left ventricular hypertrophy. Left ventricular diastolic parameters are indeterminate. There is the interventricular septum is flattened in systole, consistent with right ventricular pressure overload.  3. Left atrial size was moderately dilated.  4. Right atrial size was moderately dilated.  5. The mitral valve is grossly normal. Mild mitral valve regurgitation.  6. Tricuspid valve regurgitation is moderate.  7. The aortic valve is tricuspid. There is mild calcification of the aortic valve. There is mild thickening of the aortic valve. Aortic valve regurgitation is not visualized. Aortic valve sclerosis/calcification is present, without any evidence of aortic stenosis.  8. The inferior vena cava is dilated  in size with <50% respiratory variability, suggesting right atrial pressure of 15 mmHg. Comparison(s): No prior Echocardiogram. FINDINGS  Left Ventricle: Left ventricular ejection fraction, by estimation, is 55 to 60%. The left ventricle has normal function. The left ventricle has no regional wall motion abnormalities. The left ventricular internal cavity size was normal in size. There is  mild concentric left ventricular hypertrophy. The interventricular septum is flattened in systole, consistent with right ventricular pressure overload. Left ventricular diastolic parameters are indeterminate. Right Ventricle: The right ventricular size is severely enlarged. No increase in right ventricular wall thickness. Right ventricular systolic function is moderately reduced. There is moderately elevated pulmonary artery systolic pressure. The tricuspid regurgitant velocity is 3.12 m/s, and with an assumed right atrial pressure of 15 mmHg, the estimated right ventricular systolic pressure is 53.9 mmHg. Left Atrium: Left atrial size was  moderately dilated. Right Atrium: Right atrial size was moderately dilated. Pericardium: There is no evidence of pericardial effusion. Mitral Valve: The mitral valve is grossly normal. There is mild thickening of the mitral valve leaflet(s). There is mild calcification of the mitral valve leaflet(s). Mild mitral valve regurgitation. Tricuspid Valve: The tricuspid valve is normal in structure. Tricuspid valve regurgitation is moderate. Aortic Valve: The aortic valve is tricuspid. There is mild calcification of the aortic valve. There is mild thickening of the aortic valve. Aortic valve regurgitation is not visualized. Aortic valve sclerosis/calcification is present, without any evidence of aortic stenosis. Pulmonic Valve: The pulmonic valve was normal in structure. Pulmonic valve regurgitation is trivial. Aorta: The aortic root and ascending aorta are structurally normal, with no evidence of  dilitation. Venous: The inferior vena cava is dilated in size with less than 50% respiratory variability, suggesting right atrial pressure of 15 mmHg. IAS/Shunts: The atrial septum is grossly normal.  LEFT VENTRICLE PLAX 2D LVIDd:         4.40 cm LVIDs:         3.40 cm LV PW:         1.30 cm LV IVS:        1.30 cm LVOT diam:     2.00 cm LVOT Area:     3.14 cm  RIGHT VENTRICLE          IVC RV Basal diam:  3.70 cm  IVC diam: 3.10 cm LEFT ATRIUM            Index        RIGHT ATRIUM           Index LA diam:      4.30 cm  1.72 cm/m   RA Area:     26.10 cm LA Vol (A2C): 61.5 ml  24.63 ml/m  RA Volume:   80.80 ml  32.36 ml/m LA Vol (A4C): 111.0 ml 44.46 ml/m   AORTA Ao Root diam: 2.90 cm Ao Asc diam:  3.40 cm TRICUSPID VALVE TR Peak grad:   38.9 mmHg TR Vmax:        312.00 cm/s  SHUNTS Systemic Diam: 2.00 cm Laurance Flatten MD Electronically signed by Laurance Flatten MD Signature Date/Time: 01/24/2023/3:10:14 PM    Final (Updated)    CT Angio Chest Pulmonary Embolism (PE) W or WO Contrast  Result Date: 01/24/2023 CLINICAL DATA:  Pulmonary embolus suspected with high probability. Pain and swelling to the right lower extremity and left hand. Abdominal pain and swelling for 5 days. EXAM: CT ANGIOGRAPHY CHEST WITH CONTRAST TECHNIQUE: Multidetector CT imaging of the chest was performed using the standard protocol during bolus administration of intravenous contrast. Multiplanar CT image reconstructions and MIPs were obtained to evaluate the vascular anatomy. RADIATION DOSE REDUCTION: This exam was performed according to the departmental dose-optimization program which includes automated exposure control, adjustment of the mA and/or kV according to patient size and/or use of iterative reconstruction technique. CONTRAST:  75mL OMNIPAQUE IOHEXOL 350 MG/ML SOLN COMPARISON:  Chest radiograph 01/23/2023 FINDINGS: Cardiovascular: There is moderately good opacification of the central and proximal segmental pulmonary  arteries. Distal segmental and subsegmental vessels are poorly visualized due to somewhat weak contrast bolus. Visualized pulmonary arteries are patent without evidence of significant central pulmonary embolus. Diffuse cardiac enlargement with reflux of contrast material into the hepatic veins suggesting right heart failure. No pericardial effusion. Normal caliber thoracic aorta. Mediastinum/Nodes: Esophagus is decompressed. No significant lymphadenopathy. Thyroid gland is unremarkable. Lungs/Pleura: Motion artifact. Probable linear  atelectasis in the lung bases. No edema or consolidation suggested. No pleural effusions. No pneumothorax. Upper Abdomen: Diffuse liver enlargement. Small amount of ascites around the liver edge. Musculoskeletal: Degenerative changes in the spine. Review of the MIP images confirms the above findings. IMPRESSION: 1. No evidence of significant central pulmonary embolus. 2. Cardiac enlargement with evidence of right heart failure. 3. Lungs are clear. 4. Liver is diffusely enlarged.  Small amount of abdominal ascites. Electronically Signed   By: Burman Nieves M.D.   On: 01/24/2023 16:51   DG Foot 2 Views Right  Result Date: 01/24/2023 CLINICAL DATA:  57 year old male with right great toe pain. EXAM: RIGHT FOOT - 2 VIEW COMPARISON:  Right foot series 01/08/2023. FINDINGS: Generalized soft tissue swelling not significantly changed from earlier this month. Underlying bone mineralization is within normal limits. Calcaneus appears intact with degenerative spurring. Joint spaces and alignment are maintained. No acute fracture, dislocation, or osteolysis identified. No soft tissue gas or radiopaque foreign body identified. IMPRESSION: Generalized soft tissue swelling not significantly changed from earlier this month. No acute osseous abnormality identified about the right foot. Electronically Signed   By: Odessa Fleming M.D.   On: 01/24/2023 05:24   DG Hand 2 View Left  Result Date:  01/23/2023 CLINICAL DATA:  Second and third digit swelling EXAM: LEFT HAND - 2 VIEW COMPARISON:  None Available. FINDINGS: Frontal and lateral views of the left hand are obtained. No fracture, subluxation, or dislocation. Mild joint space narrowing within the metacarpophalangeal and interphalangeal joints. There is dorsal soft tissue swelling throughout the hand. IMPRESSION: 1. Mild osteoarthritis. 2. Dorsal soft tissue swelling. 3. No acute fracture. Electronically Signed   By: Sharlet Salina M.D.   On: 01/23/2023 21:25   DG Chest 2 View  Result Date: 01/23/2023 CLINICAL DATA:  Left chest pain, cough, short of breath, nausea EXAM: CHEST - 2 VIEW COMPARISON:  10/02/2022 FINDINGS: Frontal and lateral views of the chest demonstrates stable enlargement of the cardiac silhouette. Chronic central vascular congestion without airspace disease, effusion, or pneumothorax. No acute bony abnormalities. IMPRESSION: 1. Stable enlarged cardiac silhouette and central vascular congestion. 2. No acute airspace disease. Electronically Signed   By: Sharlet Salina M.D.   On: 01/23/2023 18:01     --------------------------------    Signed: Lula Olszewski, MD 04/07/2023 8:11 PM

## 2023-04-07 NOTE — Addendum Note (Signed)
Addended by: Donzetta Starch on: 04/07/2023 02:41 PM   Modules accepted: Orders

## 2023-04-14 ENCOUNTER — Other Ambulatory Visit: Payer: Self-pay

## 2023-04-16 LAB — HM DIABETES EYE EXAM

## 2023-04-17 ENCOUNTER — Telehealth: Payer: Self-pay | Admitting: *Deleted

## 2023-04-17 NOTE — Telephone Encounter (Signed)
The patient has been notified of the result and verbalized understanding.  All questions (if any) were answered. Latrelle Dodrill, CMA 04/17/2023 2:22 PM    Patient is using some Holistic treatment to help him-self. He will think about his results and call us back with his decision.

## 2023-04-17 NOTE — Telephone Encounter (Signed)
-----   Message from Gaynelle Cage, [place] sent at 03/10/2023  8:16 AM EDT -----  ----- Message ----- From: Quintella Reichert, MD Sent: 03/09/2023   9:04 PM EDT To: Cv Div Sleep Studies  Please let patient know that they have sleep apnea.  Recommend therapeutic CPAP titration for treatment of patient's sleep disordered breathing.  If unable to perform an in lab titration then initiate ResMed auto CPAP from 4 to 15cm H2O with heated humidity and mask of choice and overnight pulse ox on CPAP.

## 2023-04-22 ENCOUNTER — Telehealth: Payer: Self-pay | Admitting: Internal Medicine

## 2023-04-22 NOTE — Telephone Encounter (Signed)
Pt states insurance sent a PA and it needs to be signed and sent back to them. They can't release before they can send, the paperwork was not signed. Please advise and called pt back with details.

## 2023-04-23 ENCOUNTER — Other Ambulatory Visit (INDEPENDENT_AMBULATORY_CARE_PROVIDER_SITE_OTHER): Payer: PPO

## 2023-04-23 DIAGNOSIS — E119 Type 2 diabetes mellitus without complications: Secondary | ICD-10-CM

## 2023-04-23 DIAGNOSIS — R718 Other abnormality of red blood cells: Secondary | ICD-10-CM

## 2023-04-23 DIAGNOSIS — E039 Hypothyroidism, unspecified: Secondary | ICD-10-CM | POA: Diagnosis not present

## 2023-04-23 DIAGNOSIS — N529 Male erectile dysfunction, unspecified: Secondary | ICD-10-CM | POA: Diagnosis not present

## 2023-04-23 LAB — TESTOSTERONE: Testosterone: 332.31 ng/dL (ref 300.00–890.00)

## 2023-04-23 LAB — T4, FREE: Free T4: 0.88 ng/dL (ref 0.60–1.60)

## 2023-04-23 LAB — MICROALBUMIN / CREATININE URINE RATIO
Creatinine,U: 425 mg/dL
Microalb Creat Ratio: 0.6 mg/g (ref 0.0–30.0)
Microalb, Ur: 2.6 mg/dL — ABNORMAL HIGH (ref 0.0–1.9)

## 2023-04-23 LAB — TSH: TSH: 3.25 u[IU]/mL (ref 0.35–5.50)

## 2023-04-24 ENCOUNTER — Other Ambulatory Visit (HOSPITAL_COMMUNITY): Payer: Self-pay

## 2023-04-24 ENCOUNTER — Telehealth: Payer: Self-pay

## 2023-04-24 LAB — HEMOGLOBPATHY+FER W/A THAL RFX
Hemoglobin: 15.8 g/dL (ref 13.0–17.7)
MCHC: 31.3 g/dL — ABNORMAL LOW (ref 31.5–35.7)
MCV: 75 fL — ABNORMAL LOW (ref 79–97)
RBC: 6.72 x10E6/uL — ABNORMAL HIGH (ref 4.14–5.80)
RDW: 23.5 % — ABNORMAL HIGH (ref 11.6–15.4)
WBC: 5.6 10*3/uL (ref 3.4–10.8)

## 2023-04-24 NOTE — Telephone Encounter (Addendum)
Pharmacy Patient Advocate Encounter   Received notification that prior authorization for Ozmepic is required/requested.   PA submitted on 04/25/23 to (ins) [employer] via CoverMyMeds Key or (Medicaid) confirmation # [id number] Status is pending

## 2023-04-25 LAB — HEMOGLOBPATHY+FER W/A THAL RFX
Ferritin: 270 ng/mL (ref 30–400)
Hgb S: 0 %
Platelets: 211 10*3/uL (ref 150–450)

## 2023-04-25 NOTE — Telephone Encounter (Signed)
Faxed prior authorization to [employer] at [phone].

## 2023-04-25 NOTE — Telephone Encounter (Signed)
Pharmacy Patient Advocate Encounter  Prior Authorization for Franki Monte has been approved by [employer] (ins).    Effective dates: 04/25/2023 through 04/24/2024

## 2023-05-01 LAB — COLOGUARD: COLOGUARD: POSITIVE — AB

## 2023-05-01 NOTE — Telephone Encounter (Signed)
I called pt to let him know Ozempic has been approved, pt states he will contact his pharmacy. Advised pt to let us know if any issues picking up RX.    FYI

## 2023-05-02 ENCOUNTER — Other Ambulatory Visit: Payer: Self-pay | Admitting: Internal Medicine

## 2023-05-02 DIAGNOSIS — R195 Other fecal abnormalities: Secondary | ICD-10-CM

## 2023-05-02 NOTE — Progress Notes (Signed)
Notify patient:  his Cologuard testing was positive - possible causes are:        - Precancerous growths (polyps) - (95%) These abnormal tissue changes may become cancer if left untreated.       - Colorectal cancer (less common) - This is found in only 4% of positive results, and is usually still curable at time of discovery.       - Sometimes the test can produce a false result.  My very STRONG recommendation is to schedule a colonoscopy - so I've already initiated a referral for that.   Inform him that if he doesn't hear from colonoscopy scheduling within 1-2 weeks, call us back to check the status of the referral.  I sent referral to [facility] gastroenterology, make sure he's ok with that.  If patient not agreeable to colonoscopy, then see if he would be willing to schedule an appointment to discuss it further with me.  If not agreeable to that either, then send task back to me so I can give him a call about it.    Lula Olszewski, MD  05/02/2023 1:18 PM

## 2023-05-06 ENCOUNTER — Telehealth (HOSPITAL_COMMUNITY): Payer: Self-pay

## 2023-05-06 ENCOUNTER — Other Ambulatory Visit (HOSPITAL_COMMUNITY): Payer: Self-pay

## 2023-05-06 NOTE — Telephone Encounter (Signed)
Advanced Heart Failure Patient Advocate Encounter  The patient was approved for a [employer] grant that will help cover the cost of Entresto, Jardiance, Metoprolol, Spironolactone.  Total amount awarded, $10,000.  Effective: 04/06/2023 - 04/04/2024.  BIN [id number] PCN [id number] Group [id number] ID [id number]  Processing information provided to [facility]. Confirmed $0 copay on Entresto, Jardiance. Informed patient by phone.  Burnell Blanks, CPhT Rx Patient Advocate Phone: [phone]

## 2023-05-09 NOTE — Progress Notes (Signed)
Notify patient:  his Cologuard testing was positive - possible causes are:        - Precancerous growths (polyps) - (95%) These abnormal tissue changes may become cancer if left untreated.       - Colorectal cancer (less common) - This is found in only 4% of positive results, and is usually still curable at time of discovery.       - Sometimes the test can produce a false result.  My very STRONG recommendation is to schedule a colonoscopy - so I've already initiated a referral for that.   Inform him that if he doesn't hear from colonoscopy scheduling within 1-2 weeks, call us back to check the status of the referral.  If patient not agreeable to colonoscopy, then see if he would be willing to schedule an appointment to discuss it further with me.  If not agreeable to that either, then send task back to me so I can give him a call about it.    Lula Olszewski, MD  05/09/2023 8:55 AM

## 2023-05-15 LAB — HEMOGLOBPATHY+FER W/A THAL RFX
Hematocrit: 50.5 % (ref 37.5–51.0)
Hgb A: 98 % (ref 96.4–98.8)
Hgb F: 0 % (ref 0.0–2.0)
MCH: 23.5 pg — ABNORMAL LOW (ref 26.6–33.0)

## 2023-05-15 LAB — ALPHA-THALASSEMIA

## 2023-05-16 ENCOUNTER — Encounter: Payer: Self-pay | Admitting: Internal Medicine

## 2023-05-16 DIAGNOSIS — D563 Thalassemia minor: Secondary | ICD-10-CM | POA: Insufficient documentation

## 2023-05-20 ENCOUNTER — Encounter: Payer: Self-pay | Admitting: Internal Medicine

## 2023-05-29 ENCOUNTER — Ambulatory Visit (INDEPENDENT_AMBULATORY_CARE_PROVIDER_SITE_OTHER): Payer: PPO | Admitting: Family Medicine

## 2023-05-29 ENCOUNTER — Encounter: Payer: Self-pay | Admitting: Family Medicine

## 2023-05-29 ENCOUNTER — Encounter: Payer: Self-pay | Admitting: Internal Medicine

## 2023-05-29 VITALS — BP 170/110 | HR 66 | Temp 98.4°F | Ht 71.0 in | Wt 262.8 lb

## 2023-05-29 DIAGNOSIS — I10 Essential (primary) hypertension: Secondary | ICD-10-CM | POA: Diagnosis not present

## 2023-05-29 DIAGNOSIS — I5032 Chronic diastolic (congestive) heart failure: Secondary | ICD-10-CM

## 2023-05-29 DIAGNOSIS — M10041 Idiopathic gout, right hand: Secondary | ICD-10-CM | POA: Diagnosis not present

## 2023-05-29 MED ORDER — TRAMADOL HCL 50 MG PO TABS: 50.0000 mg | ORAL_TABLET | Freq: Three times a day (TID) | ORAL | 0 refills | Status: AC | PRN

## 2023-05-29 MED ORDER — PREDNISONE 10 MG PO TABS: ORAL_TABLET | ORAL | 0 refills | Status: DC

## 2023-05-29 NOTE — Progress Notes (Signed)
Subjective  CC:  Chief Complaint  Patient presents with   swollen hand    Pt stated that his Rt hand is swollen and in a lot of pain. Not sure if he did anything to bring on the pain    Same day acute visit; PCP not available. New pt to me. Chart reviewed.   HPI: Parker [name] is a 57 y.o. male who presents to the office today to address the problems listed above in the chief complaint. 57 year old male with chronic heart failure, diabetes, hypertension, hyperlipidemia presents due to acute right hand pain that started 1 to 2 days ago.  Has recurrent gout.  Reports had similar presentation about 2 months ago treated with prednisone.  Chart review shows that he has had recurrent gout.  No recent uric acid level.  Patient denies fevers, chills or rash.  No trauma.  Pain is severe, limiting his ability to hold the steering wheel or move his fourth and fifth digits.  Assessment  1. Acute idiopathic gout of right hand   2. Primary hypertension   3. Chronic heart failure with preserved ejection fraction (HCC)      Plan  Acute gout flare: Had long discussion of etiology, treatment options and prevention.  Elect to treat with Ultram and prednisone taper.  Education given.  Recommend follow-up in 2 to 3 weeks for further evaluation.  Ensure that gout has resolved and recommend uric acid levels and discussion of preventive medications.  Patient inquires about dietary recommendations.  See after visit summary for education. Hypertension: Elevated blood pressure today, thought to be related to acute pain response.  Will follow-up in 2 to 3 weeks to ensure blood pressure is controlled.   Chronic heart failure and diabetes: Monitor fluid status and glucose control on prednisone.  Follow up: 2 to 3 weeks with PCP for follow-up gout   No orders of the defined types were placed in this encounter.  Meds ordered this encounter  Medications   traMADol (ULTRAM) 50 MG tablet    Sig: Take 1 tablet (50  mg total) by mouth every 8 (eight) hours as needed for up to 5 days.    Dispense:  15 tablet    Refill:  0   predniSONE (DELTASONE) 10 MG tablet    Sig: Take 4 tabs qd x 2 days, 3 qd x 2 days, 2 qd x 2d, 1qd x 3 days    Dispense:  21 tablet    Refill:  0      I reviewed the patients updated PMH, FH, and SocHx.    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   High risk for readmission 03/09/2023   History of stroke 03/09/2023   Drug-induced hypokalemia 03/09/2023   Kidney injury 03/07/2023   Vertigo 03/07/2023   Obesity, Class III, BMI 40-49.9 (morbid obesity) (HCC) 01/25/2023   Controlled type 2 diabetes mellitus without complication, without long-term current use of insulin (HCC) 01/23/2023   HTN (hypertension) 01/23/2023   Hypothyroidism 01/23/2023   Gout 01/23/2023   Edema of both lower extremities 01/09/2023   Prediabetes 01/08/2023   Cervical spondylosis 10/10/2021   Positive cardiac stress test 06/28/2021   Chronic heart failure with preserved ejection fraction (HCC) 05/18/2021   Alcohol use 05/18/2021   Depression 05/18/2021   Large liver 05/18/2021   H/O medication noncompliance 05/18/2021   Mitral valve regurgitation 05/18/2021   Tricuspid valve regurgitation 05/18/2021   IDA (iron  deficiency anemia) 04/08/2021   LVH (left ventricular hypertrophy) 04/08/2021   Hypomagnesemia 04/08/2021   Idiopathic chronic gout 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 08/22/2020   Lymphedema 08/22/2020   OSA (obstructive sleep apnea) 01/18/2020   Transaminitis 01/11/2020   Arthralgia of left ankle 07/06/2019   Primary localized osteoarthritis of left knee 01/18/2019   Severe obesity (BMI 35.0-39.9) with comorbidity (HCC) 10/30/2018   Left knee pain 10/23/2018   Erectile dysfunction 01/06/2018   Unspecified atrial fibrillation (HCC) 08/20/2017   Diverticulosis of large intestine without hemorrhage 10/16/2014   Tubular adenoma of colon  10/16/2014   Chronic neck pain 09/14/2014   Hyperlipidemia 08/26/2014   Erectile dysfunction due to arterial insufficiency 08/26/2014   Plantar fasciitis of right foot 04/06/2013   Deformity of metatarsal 04/06/2013   Acquired equinus deformity of foot 04/06/2013   Current Meds  Medication Sig   acetaminophen (TYLENOL) 500 MG tablet Take 500-1,000 mg by mouth every 8 (eight) hours as needed for moderate pain.   albuterol (VENTOLIN HFA) 108 (90 Base) MCG/ACT inhaler Inhale 2 puffs into the lungs every 6 (six) hours as needed for shortness of breath.   allopurinol (ZYLOPRIM) 300 MG tablet Take 1 tablet (300 mg total) by mouth daily.   Clotrimazole 1 % OINT Apply 1 Application topically 2 (two) times a week.   empagliflozin (JARDIANCE) 10 MG TABS tablet Take 1 tablet (10 mg total) by mouth daily.   fluticasone (FLONASE) 50 MCG/ACT nasal spray Place 2 sprays into both nostrils daily. (Patient taking differently: Place 2 sprays into both nostrils daily as needed for allergies.)   folic acid (FOLVITE) 1 MG tablet Take 1 mg by mouth daily as needed (low iron).   gabapentin (NEURONTIN) 300 MG capsule Take 1 capsule (300 mg total) by mouth 2 (two) times daily.   levothyroxine (SYNTHROID) 50 MCG tablet Take 1 tablet (50 mcg total) by mouth daily. Needs to be adjusted every 3-6 months based on labwork if still taking amiodarone   meclizine (ANTIVERT) 25 MG tablet Take 25 mg by mouth 3 (three) times daily as needed for dizziness.   metoprolol succinate (TOPROL-XL) 50 MG 24 hr tablet Take 1 tablet (50 mg total) by mouth daily.   Multiple Vitamins-Minerals (MULTIVITAMIN WITH MINERALS) tablet Take 1 tablet by mouth daily.   potassium chloride (KLOR-CON) 10 MEQ tablet Take 1 tablet (10 mEq total) by mouth daily. Take with torsemide because torsemide lowers potassium   predniSONE (DELTASONE) 10 MG tablet Take 4 tabs qd x 2 days, 3 qd x 2 days, 2 qd x 2d, 1qd x 3 days   rivaroxaban (XARELTO) 20 MG TABS tablet  Take 1 tablet (20 mg total) by mouth daily with supper.   rosuvastatin (CRESTOR) 10 MG tablet Take 1 tablet (10 mg total) by mouth daily.   rosuvastatin (CRESTOR) 20 MG tablet Take 1 tablet (20 mg total) by mouth daily. Replaces 10 mg dose, can cause cramps   sacubitril-valsartan (ENTRESTO) 49-51 MG Take 1 tablet by mouth 2 (two) times daily.   Semaglutide,0.25 or 0.5MG /DOS, (OZEMPIC, 0.25 OR 0.5 MG/DOSE,) 2 MG/3ML SOPN Inject 0.5 mg into the skin once a week.   spironolactone (ALDACTONE) 25 MG tablet Take 1 tablet (25 mg total) by mouth daily.   torsemide (DEMADEX) 20 MG tablet Take 1 tablet (20 mg total) by mouth daily.   traMADol (ULTRAM) 50 MG tablet Take 1 tablet (50 mg total) by mouth every 8 (eight) hours as needed for up to 5 days.  traZODone (DESYREL) 50 MG tablet Take 0.5 tablets (25 mg total) by mouth at bedtime as needed for sleep.    Allergies: Patient is allergic to iodinated contrast media and other. Family History: Patient family history is not on file. Social History:  Patient  reports that he has never smoked. He has never used smokeless tobacco. He reports current alcohol use of about 6.0 standard drinks of alcohol per week. He reports that he does not use drugs.  Review of Systems: Constitutional: Negative for fever malaise or anorexia Cardiovascular: negative for chest pain Respiratory: negative for SOB or persistent cough Gastrointestinal: negative for abdominal pain showed see  Objective  Vitals: BP (!) 170/110   Pulse 66   Temp 98.4 F (36.9 C)   Ht 5\' 11"  (1.803 m)   Wt 262 lb 12.8 oz (119.2 kg)   SpO2 97%   BMI 36.65 kg/m  General:  A&Ox3 Cardiovascular:  RRR  Right hand: Fourth and fifth MCPs extremely swollen, warm, no erythema, pain with slight movement of fourth and fifth digits.  Normal wrist  Commons side effects, risks, benefits, and alternatives for medications and treatment plan prescribed today were discussed, and the patient expressed  understanding of the given instructions. Patient is instructed to call or message via MyChart if he/she has any questions or concerns regarding our treatment plan. No barriers to understanding were identified. We discussed Red Flag symptoms and signs in detail. Patient expressed understanding regarding what to do in case of urgent or emergency type symptoms.  Medication list was reconciled, printed and provided to the patient in AVS. Patient instructions and summary information was reviewed with the patient as documented in the AVS. This note was prepared with assistance of Dragon voice recognition software. Occasional wrong-word or sound-a-like substitutions may have occurred due to the inherent limitations of voice recognition software

## 2023-06-04 ENCOUNTER — Ambulatory Visit (INDEPENDENT_AMBULATORY_CARE_PROVIDER_SITE_OTHER): Payer: Self-pay | Admitting: Internal Medicine

## 2023-06-04 ENCOUNTER — Telehealth: Payer: Self-pay | Admitting: Internal Medicine

## 2023-06-04 ENCOUNTER — Ambulatory Visit: Payer: Self-pay | Admitting: Internal Medicine

## 2023-06-04 DIAGNOSIS — Z5320 Procedure and treatment not carried out because of patient's decision for unspecified reasons: Secondary | ICD-10-CM

## 2023-06-04 DIAGNOSIS — Z91199 Patient's noncompliance with other medical treatment and regimen due to unspecified reason: Secondary | ICD-10-CM

## 2023-06-04 NOTE — Progress Notes (Signed)
Left prior to being seen. 

## 2023-06-04 NOTE — Telephone Encounter (Signed)
Patient requests to be called. Declined to say what it is regarding.

## 2023-06-09 ENCOUNTER — Telehealth: Payer: Self-pay | Admitting: Internal Medicine

## 2023-06-09 NOTE — Telephone Encounter (Signed)
Patient received a appointment missed message from MyChart automated system and replied stating he came 5 minutes ahead of appointment time and wasn't seen until after 35 minutes. Patient went on to state in his reply that he hadn't gotten an apology and he hopes his insurance isn't charged since he was there ahead of time and waited patiently.    Patient's appointment was for 11:20am. Per Bonita Quin, who scheduled patient, he was informed to arrive 15 minutes prior to appointment time as she has been instructed to tell all patients. Patient checked in @ 10:05 am and left @ 11:32 am without being seen. Patient has been rescheduled for 7/11.

## 2023-06-11 NOTE — Telephone Encounter (Signed)
Patient requests to be called to discuss previous message at ph# [phone]

## 2023-06-11 NOTE — Telephone Encounter (Signed)
Spoke with patient.

## 2023-06-11 NOTE — Telephone Encounter (Signed)
Left vm for patient to call the office back.

## 2023-06-11 NOTE — Telephone Encounter (Signed)
I have followed up with patient in regard.  Patient was concerned that he was not being followed up with in the lobby or given an expected time as to when he would be taken back.

## 2023-06-12 ENCOUNTER — Ambulatory Visit: Payer: PPO | Admitting: Family Medicine

## 2023-06-12 ENCOUNTER — Ambulatory Visit: Payer: PPO | Admitting: Internal Medicine

## 2023-06-12 NOTE — Telephone Encounter (Addendum)
FYI   Patient called in stating he wasn't able to make his OV with Dr. Mardelle Matte today @ 11:20 due to missed exit. Patient stated he was okay with not having OV since PCP recommended a facility on [address] that could help with his hand. Per Tiffany, I informed pt that there hasn't been a response from PCP regarding this but we would call once there is. In the meantime I offered pt to reschedule for re-evaluation of hand with Dr. Mardelle Matte since PCP next opening is 7/29 but he declined. States he would find another facility. He didn't clarify if he meant another PCP or not.

## 2023-06-13 ENCOUNTER — Ambulatory Visit: Payer: PPO

## 2023-06-13 ENCOUNTER — Other Ambulatory Visit: Payer: Self-pay

## 2023-06-13 ENCOUNTER — Ambulatory Visit
Admission: RE | Admit: 2023-06-13 | Discharge: 2023-06-13 | Disposition: A | Discharge status: Home / Self Care | Payer: PPO | Source: Ambulatory Visit

## 2023-06-13 VITALS — BP 160/107 | HR 74 | Temp 98.1°F | Resp 18 | Ht 71.0 in | Wt 260.0 lb

## 2023-06-13 DIAGNOSIS — M25841 Other specified joint disorders, right hand: Secondary | ICD-10-CM

## 2023-06-13 DIAGNOSIS — M79641 Pain in right hand: Secondary | ICD-10-CM | POA: Insufficient documentation

## 2023-06-13 DIAGNOSIS — M25541 Pain in joints of right hand: Secondary | ICD-10-CM | POA: Insufficient documentation

## 2023-06-13 DIAGNOSIS — M7989 Other specified soft tissue disorders: Secondary | ICD-10-CM | POA: Insufficient documentation

## 2023-06-13 NOTE — ED Triage Notes (Signed)
Here for "right hand pain, swelling". "Went to PCP who says it is Gout and treated for 2 wks". "I just don't think it is Gout". Pain/swelling continues. No injury known.

## 2023-06-13 NOTE — ED Provider Notes (Signed)
[facility]    CSN: [id number] Arrival date & time: 06/13/23  1016      History   Chief Complaint Chief Complaint  Patient presents with   Hand Pain    Right    HPI Parker [name] is a 57 y.o. male, with diabetes, CHF, OSA, and GERD.  HPI Patient presents for evaluation of right hand swelling and pain. Seen by PCP approximately 4 weeks ago and treated for  gout with prednisone. According to patient hand swelling and pain never improved with a complete course of prednisone. Patient denies any known injury. The swelling is localized to the fifth metacarpal joint on the dorsum surface of hand. His PCP thought the swelling was related to Gout involving the fifth toe.  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   High risk for readmission 03/09/2023   History of stroke 03/09/2023   Drug-induced hypokalemia 03/09/2023   Kidney injury 03/07/2023   Vertigo 03/07/2023   Obesity, Class III, BMI 40-49.9 (morbid obesity) (HCC) 01/25/2023   Controlled type 2 diabetes mellitus without complication, without long-term current use of insulin (HCC) 01/23/2023   HTN (hypertension) 01/23/2023   Hypothyroidism 01/23/2023   Gout 01/23/2023   Edema of both lower extremities 01/09/2023   Prediabetes 01/08/2023   Cervical spondylosis 10/10/2021   Positive cardiac stress test 06/28/2021   Chronic heart failure with preserved ejection fraction (HCC) 05/18/2021   Alcohol use 05/18/2021   Depression 05/18/2021   Large liver 05/18/2021   H/O medication noncompliance 05/18/2021   Mitral valve regurgitation 05/18/2021   Tricuspid valve regurgitation 05/18/2021   IDA (iron deficiency anemia) 04/08/2021   LVH (left ventricular hypertrophy) 04/08/2021   Hypomagnesemia 04/08/2021   Idiopathic chronic gout 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 08/22/2020   Lymphedema 08/22/2020   OSA (obstructive sleep apnea) 01/18/2020   Transaminitis 01/11/2020    Arthralgia of left ankle 07/06/2019   Primary localized osteoarthritis of left knee 01/18/2019   Severe obesity (BMI 35.0-39.9) with comorbidity (HCC) 10/30/2018   Left knee pain 10/23/2018   Erectile dysfunction 01/06/2018   Unspecified atrial fibrillation (HCC) 08/20/2017   Diverticulosis of large intestine without hemorrhage 10/16/2014   Tubular adenoma of colon 10/16/2014   Chronic neck pain 09/14/2014   Hyperlipidemia 08/26/2014   Erectile dysfunction due to arterial insufficiency 08/26/2014   Plantar fasciitis of right foot 04/06/2013   Deformity of metatarsal 04/06/2013   Acquired equinus deformity of foot 04/06/2013    Past Surgical History:  Procedure Laterality Date   CARDIOVERSION N/A 02/19/2023   Procedure: CARDIOVERSION;  Surgeon: Dolores Patty, MD;  Location: [facility] ENDOSCOPY;  Service: Cardiovascular;  Laterality: N/A;   EAR CYST EXCISION N/A 08/06/2013   Procedure: CYST REMOVAL;  Surgeon: Melvenia Beam, MD;  Location: [facility] OR;  Service: ENT;  Laterality: N/A;   ESOPHAGOGASTRODUODENOSCOPY     left knee arthroscopy  2003   PAROTIDECTOMY Right 08/06/2013   Procedure: PAROTIDECTOMY;  Surgeon: Melvenia Beam, MD;  Location: [facility] OR;  Service: ENT;  Laterality: Right;   RIGHT HEART CATH N/A 02/17/2023   Procedure: RIGHT HEART CATH;  Surgeon: Dolores Patty, MD;  Location: [facility] INVASIVE CV LAB;  Service: Cardiovascular;  Laterality: N/A;   TEE WITHOUT CARDIOVERSION N/A 02/19/2023   Procedure: TRANSESOPHAGEAL ECHOCARDIOGRAM (TEE);  Surgeon: Dolores Patty, MD;  Location: [facility] ENDOSCOPY;  Service: Cardiovascular;  Laterality: N/A;   TONSILLECTOMY Bilateral 08/06/2013   Procedure: TONSILLECTOMY;  Surgeon: Melvenia Beam, MD;  Location: [facility] OR;  Service: ENT;  Laterality: Bilateral;       Home Medications    Prior to Admission medications   Medication Sig Start Date End Date Taking? Authorizing Provider  acetaminophen (TYLENOL) 500 MG tablet Take 500-1,000 mg by mouth  every 8 (eight) hours as needed for moderate pain.   Yes [provider]  allopurinol (ZYLOPRIM) 300 MG tablet Take 1 tablet (300 mg total) by mouth daily. 03/07/23  Yes Lula Olszewski, MD  amiodarone (PACERONE) 200 MG tablet Take 2 tablets (400 mg total) by mouth 2 (two) times daily for 7 days, THEN 1 tablet (200 mg total) 2 (two) times daily for 14 days, THEN 1 tablet (200 mg total) daily. 02/19/23 06/13/23 Yes Simmons, Brittainy M, PA-C  empagliflozin (JARDIANCE) 10 MG TABS tablet Take 1 tablet (10 mg total) by mouth daily. 03/07/23  Yes Lula Olszewski, MD  erythromycin ophthalmic ointment 3 (three) times daily. 04/22/23  Yes [provider]  fluticasone (FLONASE) 50 MCG/ACT nasal spray Place 2 sprays into both nostrils daily. Patient taking differently: Place 2 sprays into both nostrils daily as needed for allergies. 09/16/22  Yes Freddy Finner, NP  folic acid (FOLVITE) 1 MG tablet Take 1 mg by mouth daily as needed (low iron). 07/07/22  Yes [provider]  levothyroxine (SYNTHROID) 50 MCG tablet Take 1 tablet (50 mcg total) by mouth daily. Needs to be adjusted every 3-6 months based on labwork if still taking amiodarone 03/09/23  Yes Lula Olszewski, MD  metoprolol succinate (TOPROL-XL) 50 MG 24 hr tablet Take 1 tablet (50 mg total) by mouth daily. 03/09/23  Yes Lula Olszewski, MD  Multiple Vitamins-Minerals (MULTIVITAMIN WITH MINERALS) tablet Take 1 tablet by mouth daily.   Yes [provider]  potassium chloride (KLOR-CON) 10 MEQ tablet Take 1 tablet (10 mEq total) by mouth daily. Take with torsemide because torsemide lowers potassium 03/09/23  Yes Lula Olszewski, MD  rivaroxaban (XARELTO) 20 MG TABS tablet Take 1 tablet (20 mg total) by mouth daily with supper. 03/07/23  Yes Lula Olszewski, MD  rosuvastatin (CRESTOR) 20 MG tablet Take 1 tablet (20 mg total) by mouth daily. Replaces 10 mg dose, can cause cramps 03/07/23  Yes Lula Olszewski, MD   sacubitril-valsartan (ENTRESTO) 49-51 MG Take 1 tablet by mouth 2 (two) times daily. 03/09/23  Yes Lula Olszewski, MD  Semaglutide,0.25 or 0.5MG /DOS, (OZEMPIC, 0.25 OR 0.5 MG/DOSE,) 2 MG/3ML SOPN Inject 0.5 mg into the skin once a week. 04/07/23  Yes Lula Olszewski, MD  spironolactone (ALDACTONE) 25 MG tablet Take 1 tablet (25 mg total) by mouth daily. 03/09/23  Yes Lula Olszewski, MD  torsemide (DEMADEX) 20 MG tablet Take 1 tablet (20 mg total) by mouth daily. 03/09/23  Yes Lula Olszewski, MD  traZODone (DESYREL) 50 MG tablet Take 0.5 tablets (25 mg total) by mouth at bedtime as needed for sleep. 03/07/23  Yes Lula Olszewski, MD  albuterol (VENTOLIN HFA) 108 (90 Base) MCG/ACT inhaler Inhale 2 puffs into the lungs every 6 (six) hours as needed for shortness of breath. 09/18/22   [provider]  augmented betamethasone dipropionate (DIPROLENE-AF) 0.05 % cream Apply topically 2 (two) times daily as needed. 03/19/23   [provider]  Clotrimazole 1 % OINT Apply 1 Application topically 2 (two) times a week. 04/07/23   Lula Olszewski, MD  colchicine 0.6 MG tablet Take 1 tablet (0.6 mg total) by mouth daily  as needed (gout flares). 01/25/23 01/25/24  Jonah Blue, MD  gabapentin (NEURONTIN) 300 MG capsule Take 1 capsule (300 mg total) by mouth 2 (two) times daily. 03/09/23   Lula Olszewski, MD  meclizine (ANTIVERT) 25 MG tablet Take 25 mg by mouth 3 (three) times daily as needed for dizziness. 07/07/22   [provider]  prednisoLONE acetate (PRED FORTE) 1 % ophthalmic suspension Place into the right eye. 03/26/23   [provider]  predniSONE (DELTASONE) 10 MG tablet Take 4 tabs qd x 2 days, 3 qd x 2 days, 2 qd x 2d, 1qd x 3 days 05/29/23   Willow Ora, MD  rosuvastatin (CRESTOR) 10 MG tablet Take 1 tablet (10 mg total) by mouth daily. 02/19/23   Robbie Lis M, PA-C  tadalafil (CIALIS) 20 MG tablet Take 0.5-1 tablets (10-20 mg total) by mouth every other day as  needed for erectile dysfunction. Do not take nitro with this.  Can kill you.  Must tell emergency room if taking. 03/07/23   Lula Olszewski, MD    Family History History reviewed. No pertinent family history.  Social History Social History   Tobacco Use   Smoking status: Never   Smokeless tobacco: Never  Vaping Use   Vaping status: Never Used  Substance Use Topics   Alcohol use: Yes    Alcohol/week: 6.0 standard drinks of alcohol    Types: 6 Standard drinks or equivalent per week    Comment: occ   Drug use: No     Allergies   Iodinated contrast media and Other   Review of Systems Review of Systems Pertinent negatives listed in HPI   Physical Exam Triage Vital Signs ED Triage Vitals  Encounter Vitals Group     BP 06/13/23 1054 (!) 159/103     Systolic BP Percentile --      Diastolic BP Percentile --      Pulse Rate 06/13/23 1054 70     Resp 06/13/23 1054 18     Temp 06/13/23 1054 98.1 F (36.7 C)     Temp Source 06/13/23 1054 Oral     SpO2 06/13/23 1054 97 %     Weight 06/13/23 1050 260 lb (117.9 kg)     Height 06/13/23 1050 5\' 11"  (1.803 m)     Head Circumference --      Peak Flow --      Pain Score 06/13/23 1050 10     Pain Loc --      Pain Education --      Exclude from Growth Chart --    No data found.  Updated Vital Signs BP (!) 160/107 (BP Location: Left Arm)   Pulse 74   Temp 98.1 F (36.7 C) (Oral)   Resp 18   Ht 5\' 11"  (1.803 m)   Wt 260 lb (117.9 kg)   SpO2 96%   BMI 36.26 kg/m   Visual Acuity Right Eye Distance:   Left Eye Distance:   Bilateral Distance:    Right Eye Near:   Left Eye Near:    Bilateral Near:     Physical Exam Vitals reviewed.  HENT:     Head: Normocephalic and atraumatic.  Eyes:     Extraocular Movements: Extraocular movements intact.     Conjunctiva/sclera: Conjunctivae normal.     Pupils: Pupils are equal, round, and reactive to light.  Cardiovascular:     Rate and Rhythm: Normal rate and regular rhythm.  Heart sounds: Normal heart sounds.  Pulmonary:     Effort: Pulmonary effort is normal.  Skin:    Capillary Refill: Capillary refill takes less than 2 seconds.     Findings: Rash present. Rash is papular, pustular and vesicular.  Neurological:     Mental Status: He is alert. Mental status is at baseline.      UC Treatments / Results  Labs (all labs ordered are listed, but only abnormal results are displayed) Labs Reviewed - No data to display  EKG   Radiology No results found.  Procedures Procedures (including critical care time)  Medications Ordered in UC Medications - No data to display  Initial Impression / Assessment and Plan / UC Course  I have reviewed the triage vital signs and the nursing notes.  Pertinent labs & imaging results that were available during my care of the patient were reviewed by me and considered in my medical decision making (see chart for details).    Right hand localized swelling, imaging doesn't reveal etiology of right hand swelling , Referring [facility] to hand speciality. Reccommended follow up today   Final Clinical Impressions(s) / UC Diagnoses   Final diagnoses:  Mass of joint of hand, right     Discharge Instructions      Your x-ray only indicated that you have arthritis in show soft tissue swelling.  As discussed go to [facility] urgent care for further evaluation and workup of the hand swelling as if your swelling was related to arthritis the prednisone that you were prescribed would have resolved the swelling.  I suspect you have either a cyst or an abscess under the skin.  I have  also included a copy of the impression of your x-ray that you received today.     ED Prescriptions   None    PDMP not reviewed this encounter.   Bing Neighbors, NP 06/15/23 [phone]

## 2023-06-13 NOTE — Telephone Encounter (Signed)
Sent my chart message informing patient of this advice.

## 2023-06-13 NOTE — Discharge Instructions (Addendum)
Your x-ray only indicated that you have arthritis in show soft tissue swelling.  As discussed go to [facility] urgent care for further evaluation and workup of the hand swelling as if your swelling was related to arthritis the prednisone that you were prescribed would have resolved the swelling.  I suspect you have either a cyst or an abscess under the skin.  I have  also included a copy of the impression of your x-ray that you received today.

## 2023-06-17 ENCOUNTER — Other Ambulatory Visit: Payer: Self-pay

## 2023-06-17 DIAGNOSIS — M10041 Idiopathic gout, right hand: Secondary | ICD-10-CM

## 2023-06-22 NOTE — Progress Notes (Deleted)
     T. , MD, CAQ Sports Medicine [facility] [address] [place], [address]  Phone: [phone]  FAX: [phone]  JAYTHEN HAMME - 57 y.o. male  MRN [id number]  Date of Birth: [birth year]  Date: 06/25/2023  PCP: Lula Olszewski, MD  Referral: Lula Olszewski, MD  No chief complaint on file.  Subjective:   NORVEL WENKER is a 57 y.o. very pleasant male patient with There is no height or weight on file to calculate BMI. who presents with the following:  The patient is here to discuss hand pain with me.  He actually has already seen hand surgery, and he saw them roughly 2 weeks ago.  Initially, primary care felt like he may have a gout flare, but he had no response to a round of oral steroids.  Hand surgery did recommend a follow-up hand MRI.  On chart review, it looks like the patient did not want to go through with the MRI.  Hand surgery also gave him another round of steroids.    Review of Systems is noted in the HPI, as appropriate  Objective:   There were no vitals taken for this visit.  GEN: No acute distress; alert,appropriate. PULM: Breathing comfortably in no respiratory distress PSYCH: Normally interactive.   Laboratory and Imaging Data:  Assessment and Plan:   ***

## 2023-06-25 ENCOUNTER — Ambulatory Visit: Payer: PPO | Admitting: Family Medicine

## 2023-06-25 DIAGNOSIS — G8929 Other chronic pain: Secondary | ICD-10-CM

## 2023-07-09 ENCOUNTER — Ambulatory Visit (INDEPENDENT_AMBULATORY_CARE_PROVIDER_SITE_OTHER): Payer: PPO | Admitting: Internal Medicine

## 2023-07-09 ENCOUNTER — Encounter: Payer: Self-pay | Admitting: Internal Medicine

## 2023-07-09 VITALS — BP 151/102 | HR 66 | Temp 98.2°F | Ht 71.0 in | Wt 258.4 lb

## 2023-07-09 DIAGNOSIS — Z79899 Other long term (current) drug therapy: Secondary | ICD-10-CM

## 2023-07-09 DIAGNOSIS — M109 Gout, unspecified: Secondary | ICD-10-CM | POA: Diagnosis not present

## 2023-07-09 DIAGNOSIS — R2232 Localized swelling, mass and lump, left upper limb: Secondary | ICD-10-CM

## 2023-07-09 DIAGNOSIS — E119 Type 2 diabetes mellitus without complications: Secondary | ICD-10-CM

## 2023-07-09 DIAGNOSIS — F32A Depression, unspecified: Secondary | ICD-10-CM | POA: Diagnosis not present

## 2023-07-09 DIAGNOSIS — R42 Dizziness and giddiness: Secondary | ICD-10-CM

## 2023-07-09 DIAGNOSIS — G4733 Obstructive sleep apnea (adult) (pediatric): Secondary | ICD-10-CM | POA: Diagnosis not present

## 2023-07-09 MED ORDER — BUPROPION HCL ER (SR) 100 MG PO TB12: 100.0000 mg | ORAL_TABLET | Freq: Every morning | ORAL | 3 refills | Status: DC

## 2023-07-09 NOTE — Assessment & Plan Note (Signed)
Mild depression has been reported. After discussing the benefits of therapy versus medication, we decided to start Wellbutrin for its advantages, including weight loss and the absence of erectile dysfunction side effects. They will begin Wellbutrin at a low dose, taking it in the morning.

## 2023-07-09 NOTE — Assessment & Plan Note (Addendum)
They have a history of sleep apnea but are not using CPAP. We discussed the benefits of CPAP use, which include treating depression, protecting blood pressure, promoting muscle gain, and reducing the risk of dementia and heart disease. They are encouraged to contact their sleep doctor to discuss CPAP use and recommended to download the SnoreLab app to monitor snoring.Explained benefits at length Encouraged patient to use ResMed and p10 nasal prong.

## 2023-07-09 NOTE — Patient Instructions (Signed)
VISIT SUMMARY:  During our appointment, we discussed your concerns about gout, depression, sleep apnea, and potential thyroid issues. We also talked about your recent weight loss and dietary changes. You expressed interest in exploring both medication adjustments and lifestyle changes to manage these conditions.  YOUR PLAN:  -DEPRESSION: We decided to start you on a low dose of Wellbutrin, a medication that can help manage depression and has benefits like weight loss and no erectile dysfunction side effects.  -SLEEP APNEA: We discussed the benefits of using a CPAP machine for your sleep apnea, which can help with depression, blood pressure, muscle gain, and reduce the risk of dementia and heart disease. I recommend you to contact your sleep doctor to discuss this further and to download the SnoreLab app to monitor your snoring.  -GOUT: Due to the pain and swelling in your hand from gout, I have referred you to a hand surgeon for further evaluation.  -METABOLIC DISORDER: We discussed how sleep apnea can impact your metabolic disorder. I encourage you to continue with your weight loss efforts and medication regimen.  -VERTIGO: For your reported vertigo, I recommend trying the Epley maneuver and if symptoms persist, we may consider a referral to a vestibular rehab team.  -GENERAL HEALTH MAINTENANCE: I have ordered labs for uric acid, A1c, and cholesterol. I also recommend you to contact your cardiologist to discuss a potential procedure to address atrial fibrillation caused by sleep apnea. We will also check your thyroid function due to potential damage from amiodarone.  INSTRUCTIONS:  Please start taking Wellbutrin in the morning at the prescribed dose. Contact your sleep doctor to discuss CPAP use and download the SnoreLab app to monitor your snoring. Continue with your weight loss efforts and medication regimen. Try the Epley maneuver for vertigo and contact your cardiologist to discuss a  potential procedure for atrial fibrillation. Await further instructions regarding your appointment with the hand surgeon and the results of your lab tests.

## 2023-07-09 NOTE — Progress Notes (Signed)
[facility]: [phone]   Routine Medical Office Visit  Patient:  Parker [name]      Age: 57 y.o.       Sex:  male  Date:   07/09/2023 Patient Care Team: Lula Olszewski, MD as PCP - General (Internal Medicine) Quintella Reichert, MD as Consulting Physician (Sleep Medicine) Bensimhon, Bevelyn Buckles, MD as Consulting Physician (Cardiology) Today's Healthcare Provider: Lula Olszewski, MD   Assessment and Plan:   Jarnell was seen today for gout of right hand follow-up.  Depression, unspecified depression type Overview:    07/09/2023    2:12 PM 05/29/2023   10:49 AM 03/07/2023    1:20 PM  PHQ9 SCORE ONLY  PHQ-9 Total Score 10 0 0     Orders: -     buPROPion HCl ER (SR); Take 1 tablet (100 mg total) by mouth in the morning.  Dispense: 90 tablet; Refill: 3 -     CBC with Differential/Platelet -     Comprehensive metabolic panel  OSA (obstructive sleep apnea) Overview: CPAP initiated by Armanda Magic after  03/08/23 FINDINGS:  1.  Moderate Obstructive Sleep Apnea with AHI 26.7/hr.   2.  No Central Sleep Apnea with pAHIc 2.3/hr.  3.  Oxygen desaturations as low as 75%.  4.  Severe snoring was present. O2 sats were < 88% for 68.7 min.  5.  Total sleep time was 7 hrs and 15 min.  6.  30.9% of total sleep time was spent in REM sleep.   7.  Prolonged sleep onset latency at 45 min  8.  Shortened REM sleep onset latency at 59 min.   9.  Total awakenings were 17.  10. Arrhythmia detection: None  Assessment & Plan: Explained benefits at length Encouraged patient to use ResMed and p10 nasal prong.   Localized swelling on left hand -     Ambulatory referral to Hand Surgery  Acute gout of left wrist, unspecified cause Overview: Flares yearly to monthly Can't afford colchicine Never took allopurinol   Orders: -     Uric acid  Controlled type 2 diabetes mellitus without complication, without long-term current use of insulin (HCC) Overview: 04/07/2023 interim  history: He reports that his perception is that his diabetes as been well controlled since last visit  He reports home capillary blood glucose readings since last visit are in the range of not checking  He reports he has NOT had any significant lows or highs or side effect(s)  He reports compliance/adherence with his current medication(s) which include: Diabetic Medications as of 04/07/2023           empagliflozin (JARDIANCE) 10 MG TABS tablet (Taking) Take 1 tablet (10 mg total) by mouth daily.   Semaglutide,0.25 or 0.5MG /DOS, (OZEMPIC, 0.25 OR 0.5 MG/DOSE,) 2 MG/3ML SOPN (Taking) INJECT 0.25MG  INTO THE SKIN ONE TIME PER WEEK       Diabetes Health Maintenance Due  Topic Date Due   OPHTHALMOLOGY EXAM  Never done   HEMOGLOBIN A1C  07/26/2023   FOOT EXAM  04/06/2024   04/07/2023 Diabetic Foot Exam - Simple   Simple Foot Form Diabetic Foot exam was performed with the following findings: Yes 04/07/2023  3:05 PM  Visual Inspection See comments: Yes Sensation Testing See comments: Yes Pulse Check See comments: Yes Comments Exam completed May 6 foot exam showed an ulceration and between his fourth and fifth toes on the right foot secondary to difficulty with cleaning out debris between those 2  toes which fit very closely together he is going to need wider shoes to help him spread out and probably put something between the toes to keep keep the more spread out He also has very mild neuropathy but he was correct on almost all monofilament testing with extensive testing done his pulses are weak but his skin is warm there is no hair on the foot The bottoms of the foot have very dry skin and the fifth toenail looks onychomycotic overall it appears to be tinea pedis complicating fungal nail infection     Care Team Ophthalmologist:  No care team member to displayfinancial limitations with adherence to ophthalmology yearly visit.  Lab Results  Component Value Date   HGBA1C 6.1 (H) 01/25/2023    Lab Results  Component Value Date   MICROALBUR <0.7 03/07/2023   No results found for: "LDLCALC"  Diabetes Composite Score: 4  Values used to calculate this score:   Points  Metrics      0        Blood Pressure: 140/92      1        Prescribed Statins: Yes      1        Hemoglobin A1c: 6.1%      1        Smokes Tobacco: No      1        Prescribed Aspirin: Yes        Orders: -     Hemoglobin A1c -     Lipid panel  High risk medication use -     TSH Rfx on Abnormal to Free T4  Vertigo      Assessment & Plan Depression, unspecified depression type Mild depression has been reported. After discussing the benefits of therapy versus medication, we decided to start Wellbutrin for its advantages, including weight loss and the absence of erectile dysfunction side effects. They will begin Wellbutrin at a low dose, taking it in the morning. OSA (obstructive sleep apnea) They have a history of sleep apnea but are not using CPAP. We discussed the benefits of CPAP use, which include treating depression, protecting blood pressure, promoting muscle gain, and reducing the risk of dementia and heart disease. They are encouraged to contact their sleep doctor to discuss CPAP use and recommended to download the [employer] app to monitor snoring.Explained benefits at length Encouraged patient to use ResMed and p10 nasal prong. Localized swelling on left hand  Acute gout of left wrist, unspecified cause They report hand pain and swelling with a previous diagnosis of gout. A referral to a hand surgeon for further evaluation has been made. Controlled type 2 diabetes mellitus without complication, without long-term current use of insulin (HCC) We discussed the impact of sleep apnea on their metabolic disorder. They are encouraged to continue with weight loss efforts and their medication regimen. High risk medication use  Vertigo They report experiencing vertigo. The Epley maneuver is  recommended, and a potential referral to a vestibular rehab team if symptoms persist.  General Health Maintenance Labs for uric acid, A1c, and cholesterol are ordered. They are encouraged to contact their cardiologist to discuss a potential procedure to address atrial fibrillation caused by sleep apnea. Thyroid function will be checked due to potential damage from amiodarone.      ED Discharge Orders          Ordered    Uric acid        07/09/23 1443  buPROPion ER [facility] SR) 100 MG 12 hr tablet  Every morning        07/09/23 1443    Ambulatory referral to Hand Surgery        07/09/23 1443    HgB A1c        07/09/23 1443    Lipid panel        07/09/23 1443    TSH Rfx on Abnormal to Free T4        07/09/23 1443    CBC with Differential/Platelet        07/09/23 1445    Comp Met (CMET)        07/09/23 1445            Recommended follow up: No follow-ups on file.  Future Appointments  Date Time Provider Department Center  01/07/2024  2:00 PM Lula Olszewski, MD [facility]           Clinical Presentation:    57 y.o. male who has Plantar fasciitis of right foot; Deformity of metatarsal; Acquired equinus deformity of foot; Chronic heart failure with preserved ejection fraction (HCC); Controlled type 2 diabetes mellitus without complication, without long-term current use of insulin (HCC); Unspecified atrial fibrillation (HCC); HTN (hypertension); Hypothyroidism; Gout; Obesity, Class III, BMI 40-49.9 (morbid obesity) (HCC); OSA (obstructive sleep apnea); IDA (iron deficiency anemia); Kidney injury; Vertigo; High risk for readmission; Hyperlipidemia; Erectile dysfunction; History of stroke; Drug-induced hypokalemia; Alcohol use; Cervical spondylosis; Chronic neck pain; Depression; Diverticulosis of large intestine without hemorrhage; Edema of both lower extremities; Erectile dysfunction due to arterial insufficiency; Idiopathic chronic gout 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; Large  liver; H/O medication noncompliance; LVH (left ventricular hypertrophy); Lymphedema; Mitral valve regurgitation; Prediabetes; Severe obesity (BMI 35.0-39.9) with comorbidity (HCC); Transaminitis; Tubular adenoma of colon; Arthralgia of left ankle; Left knee pain; Hypomagnesemia; Positive cardiac stress test; Primary localized osteoarthritis of left knee; Tricuspid valve regurgitation; Alpha thalassaemia minor; Swelling of hand; Pain in right hand; and Pain involving joints of fingers of both hands on their problem list. His reasons/main concerns/chief complaints for today's office visit are Gout of right hand follow-up (Dr. Mardelle Matte recommended checking uric acid levels.)   AI-Extracted: Discussed the use of AI scribe software for clinical note transcription with the patient, who gave verbal consent to proceed.  History of Present Illness   The patient, currently on multiple medications including Levaquin, losartan, prednisone, and tramadol, presents with concerns about gout and a desire to reassess their vitamin and mineral deficiencies. They report recent weight loss and dietary changes, including reduced sodium and sugar intake. The patient also expresses feelings of mild depression and anxiety, which they believe may be contributing to elevated blood pressure.  The patient is open to exploring both pharmacological and therapeutic interventions for their depression. They express interest in Wellbutrin due to its potential benefits of increased energy, reduced appetite, and absence of erectile dysfunction.  The patient also reports issues with sleep, often waking up in the middle of the night and struggling to fall back asleep. They express reluctance to use a CPAP machine for their diagnosed sleep apnea, citing concerns about comfort and long-term use. However, they are open to trying other strategies to improve sleep quality, including mindfulness exercises and sleep hygiene practices.  The patient's gout  has been causing significant discomfort, particularly in their hand, to the point where they are unable to move one of their fingers. They express a desire for a second opinion from a Hydrographic surveyor.  The patient also mentions concerns about their thyroid health and the potential impact of their current medications, particularly amiodarone, on their thyroid function. They express a desire to discuss alternative treatments for their heart condition with their cardiologist, in hopes of reducing the need for amiodarone.  In summary, the patient presents with multiple ongoing health concerns, including gout, depression, sleep apnea, and potential thyroid issues. They are actively seeking ways to manage these conditions, both through medication adjustments and lifestyle changes.        He  has a past medical history of (HFpEF) heart failure with preserved ejection fraction (HCC), Acute on chronic heart failure (HCC) (05/16/2022), AKI (acute kidney injury) (HCC) (01/23/2023), Arthritis, Cataract, CHF (congestive heart failure) (HCC), Diabetes mellitus without complication (HCC), Diastolic CHF (HCC) (02/14/2023), Dyspnea, Dysrhythmia, GERD (gastroesophageal reflux disease), History of COVID-19 (05/18/2021), Hypertension, Localized swelling of left lower extremity (01/11/2020), Localized swelling of right lower extremity (04/24/2020), Muscle spasm, OSA on CPAP (03/07/2023), Peripheral edema, Plantar fasciitis, Situational insomnia (01/06/2018), Sleep apnea, and Sprain of deltoid ligament of left ankle (08/10/2019). Current Outpatient Medications on File Prior to Visit  Medication Sig   acetaminophen (TYLENOL) 500 MG tablet Take 500-1,000 mg by mouth every 8 (eight) hours as needed for moderate pain.   albuterol (VENTOLIN HFA) 108 (90 Base) MCG/ACT inhaler Inhale 2 puffs into the lungs every 6 (six) hours as needed for shortness of breath.   allopurinol (ZYLOPRIM) 300 MG tablet Take 1 tablet (300 mg total) by  mouth daily.   augmented betamethasone dipropionate (DIPROLENE-AF) 0.05 % cream Apply topically 2 (two) times daily as needed.   Clotrimazole 1 % OINT Apply 1 Application topically 2 (two) times a week.   colchicine 0.6 MG tablet Take 1 tablet (0.6 mg total) by mouth daily as needed (gout flares).   empagliflozin (JARDIANCE) 10 MG TABS tablet Take 1 tablet (10 mg total) by mouth daily.   erythromycin ophthalmic ointment 3 (three) times daily.   fluticasone (FLONASE) 50 MCG/ACT nasal spray Place 2 sprays into both nostrils daily. (Patient taking differently: Place 2 sprays into both nostrils daily as needed for allergies.)   folic acid (FOLVITE) 1 MG tablet Take 1 mg by mouth daily as needed (low iron).   gabapentin (NEURONTIN) 300 MG capsule Take 1 capsule (300 mg total) by mouth 2 (two) times daily.   levofloxacin (LEVAQUIN) 500 MG tablet TAKE 1 TABLET BY MOUTH EVERY DAY FOR 10 DAYS   levothyroxine (SYNTHROID) 50 MCG tablet Take 1 tablet (50 mcg total) by mouth daily. Needs to be adjusted every 3-6 months based on labwork if still taking amiodarone   losartan (COZAAR) 50 MG tablet Take 1 tablet by mouth daily.   meclizine (ANTIVERT) 25 MG tablet Take 25 mg by mouth 3 (three) times daily as needed for dizziness.   metoprolol succinate (TOPROL-XL) 50 MG 24 hr tablet Take 1 tablet (50 mg total) by mouth daily.   Multiple Vitamins-Minerals (MULTIVITAMIN WITH MINERALS) tablet Take 1 tablet by mouth daily.   potassium chloride (KLOR-CON) 10 MEQ tablet Take 1 tablet (10 mEq total) by mouth daily. Take with torsemide because torsemide lowers potassium   prednisoLONE acetate (PRED FORTE) 1 % ophthalmic suspension Place into the right eye.   predniSONE (STERAPRED UNI-PAK 48 TAB) 10 MG (48) TBPK tablet See admin instructions. see package   rivaroxaban (XARELTO) 20 MG TABS tablet Take 1 tablet (20 mg total) by mouth daily with supper.   rosuvastatin (CRESTOR) 20 MG tablet Take 1 tablet (20  mg total) by mouth  daily. Replaces 10 mg dose, can cause cramps   sacubitril-valsartan (ENTRESTO) 49-51 MG Take 1 tablet by mouth 2 (two) times daily.   Semaglutide,0.25 or 0.5MG /DOS, (OZEMPIC, 0.25 OR 0.5 MG/DOSE,) 2 MG/3ML SOPN Inject 0.5 mg into the skin once a week.   spironolactone (ALDACTONE) 25 MG tablet Take 1 tablet (25 mg total) by mouth daily.   tadalafil (CIALIS) 20 MG tablet Take 0.5-1 tablets (10-20 mg total) by mouth every other day as needed for erectile dysfunction. Do not take nitro with this.  Can kill you.  Must tell emergency room if taking.   torsemide (DEMADEX) 20 MG tablet Take 1 tablet (20 mg total) by mouth daily.   traMADol (ULTRAM) 50 MG tablet Take 1 tablet by mouth every 8 (eight) hours as needed.   traZODone (DESYREL) 50 MG tablet Take 0.5 tablets (25 mg total) by mouth at bedtime as needed for sleep.   amiodarone (PACERONE) 200 MG tablet Take 2 tablets (400 mg total) by mouth 2 (two) times daily for 7 days, THEN 1 tablet (200 mg total) 2 (two) times daily for 14 days, THEN 1 tablet (200 mg total) daily.   No current facility-administered medications on file prior to visit.   Medications Discontinued During This Encounter  Medication Reason   rosuvastatin (CRESTOR) 10 MG tablet    predniSONE (DELTASONE) 10 MG tablet          Clinical Data Analysis:   Physical Exam  BP (!) 151/102 (BP Location: Right Arm, Patient Position: Sitting)   Pulse 66   Temp 98.2 F (36.8 C) (Temporal)   Ht 5\' 11"  (1.803 m)   Wt 258 lb 6.4 oz (117.2 kg)   SpO2 98%   BMI 36.04 kg/m  Wt Readings from Last 10 Encounters:  07/09/23 258 lb 6.4 oz (117.2 kg)  06/13/23 260 lb (117.9 kg)  05/29/23 262 lb 12.8 oz (119.2 kg)  04/07/23 270 lb 3.2 oz (122.6 kg)  03/07/23 280 lb 6.4 oz (127.2 kg)  03/03/23 279 lb 6.4 oz (126.7 kg)  02/14/23 296 lb 9.6 oz (134.5 kg)  01/25/23 295 lb 3.2 oz (133.9 kg)  03/11/22 287 lb (130.2 kg)  08/06/13 247 lb (112 kg)   Vital signs reviewed.  Nursing notes reviewed.  Weight trend reviewed. Abnormalities and Problem-Specific physical exam findings:  thick neck  General Appearance:  No acute distress appreciable.   Well-groomed, healthy-appearing male.  Well proportioned with no abnormal fat distribution.  Good muscle tone. Skin: Clear and well-hydrated. Pulmonary:  Normal work of breathing at rest, no respiratory distress apparent. SpO2: 98 %  Musculoskeletal: All extremities are intact.  Neurological:  Awake, alert, oriented, and engaged.  No obvious focal neurological deficits or cognitive impairments.  Sensorium seems unclouded.   Speech is clear and coherent with logical content. Psychiatric:  Appropriate mood, pleasant and cooperative demeanor, thoughtful and engaged during the exam  Results Reviewed:     No results found for any visits on 07/09/23.  Abstract on 05/29/2023  Component Date Value   HM Diabetic Eye Exam 04/16/2023 No Retinopathy   Lab on 04/23/2023  Component Date Value   Microalb, Ur 04/23/2023 2.6 (H)    Creatinine,U 04/23/2023 425.0    Microalb Creat Ratio 04/23/2023 0.6    Hgb F 04/23/2023 0.0    Hgb A 04/23/2023 98.0    Hgb A2 04/23/2023 2.0    Hgb S 04/23/2023 0.0    Interpretation, Hgb Fract 04/23/2023 Comment  Ferritin 04/23/2023 270    WBC 04/23/2023 5.6    RBC 04/23/2023 6.72 (H)    Hemoglobin 04/23/2023 15.8    Hematocrit 04/23/2023 50.5    MCV 04/23/2023 75 (L)    MCH 04/23/2023 23.5 (L)    MCHC 04/23/2023 31.3 (L)    RDW 04/23/2023 23.5 (H)    Platelets 04/23/2023 211    Alpha Thal Reflex 04/23/2023 Comment    Free T4 04/23/2023 0.88    TSH 04/23/2023 3.25    Testosterone 04/23/2023 332.31    Alpha-Thalassemia 04/23/2023 Comment:   Hospital Outpatient Visit on 03/12/2023  Component Date Value   Sodium 03/12/2023 135    Potassium 03/12/2023 4.1    Chloride 03/12/2023 100    CO2 03/12/2023 21 (L)    Glucose, Bld 03/12/2023 103 (H)    BUN 03/12/2023 13    Creatinine, Ser 03/12/2023 1.20    Calcium  03/12/2023 9.6    GFR, Estimated 03/12/2023 >60    Anion gap 03/12/2023 14   Office Visit on 03/07/2023  Component Date Value   Microalb, Ur 03/07/2023 <0.7    Creatinine,U 03/07/2023 15.1    Microalb Creat Ratio 03/07/2023 4.6    COLOGUARD 04/23/2023 Positive (A)   Hospital Outpatient Visit on 03/03/2023  Component Date Value   WBC 03/03/2023 5.6    RBC 03/03/2023 7.01 (H)    Hemoglobin 03/03/2023 15.4    HCT 03/03/2023 46.8    MCV 03/03/2023 66.8 (L)    MCH 03/03/2023 22.0 (L)    MCHC 03/03/2023 32.9    RDW 03/03/2023 22.4 (H)    Platelets 03/03/2023 225    nRBC 03/03/2023 0.0    B Natriuretic Peptide 03/03/2023 98.1    Sodium 03/03/2023 138    Potassium 03/03/2023 3.6    Chloride 03/03/2023 102    CO2 03/03/2023 23    Glucose, Bld 03/03/2023 108 (H)    BUN 03/03/2023 11    Creatinine, Ser 03/03/2023 0.97    Calcium 03/03/2023 9.0    GFR, Estimated 03/03/2023 >60    Anion gap 03/03/2023 13   Hospital Outpatient Visit on 02/14/2023  Component Date Value   Sodium 02/14/2023 139    Potassium 02/14/2023 3.3 (L)    Chloride 02/14/2023 100    CO2 02/14/2023 29    Glucose, Bld 02/14/2023 113 (H)    BUN 02/14/2023 7    Creatinine, Ser 02/14/2023 0.83    Calcium 02/14/2023 8.7 (L)    Total Protein 02/14/2023 7.2    Albumin 02/14/2023 3.5    AST 02/14/2023 31    ALT 02/14/2023 15    Alkaline Phosphatase 02/14/2023 54    Total Bilirubin 02/14/2023 1.1    GFR, Estimated 02/14/2023 >60    Anion gap 02/14/2023 10    WBC 02/14/2023 5.0    RBC 02/14/2023 5.69    Hemoglobin 02/14/2023 12.2 (L)    HCT 02/14/2023 38.3 (L)    MCV 02/14/2023 67.3 (L)    MCH 02/14/2023 21.4 (L)    MCHC 02/14/2023 31.9    RDW 02/14/2023 20.5 (H)    Platelets 02/14/2023 199    nRBC 02/14/2023 0.0    B Natriuretic Peptide 02/14/2023 248.5 (H)    TSH 02/14/2023 3.423    Lactic Acid, Venous 02/14/2023 1.6   Admission on 01/23/2023, Discharged on 01/25/2023  Component Date Value   WBC  01/23/2023 6.5    RBC 01/23/2023 5.48    Hemoglobin 01/23/2023 11.8 (L)    HCT 01/23/2023 37.2 (L)  MCV 01/23/2023 67.9 (L)    MCH 01/23/2023 21.5 (L)    MCHC 01/23/2023 31.7    RDW 01/23/2023 19.7 (H)    Platelets 01/23/2023 228    nRBC 01/23/2023 0.0    Troponin I (High Sensiti* 01/23/2023 35 (H)    Magnesium 01/23/2023 1.7    B Natriuretic Peptide 01/23/2023 377.4 (H)    Sodium 01/23/2023 137    Potassium 01/23/2023 3.6    Chloride 01/23/2023 101    CO2 01/23/2023 21 (L)    Glucose, Bld 01/23/2023 102 (H)    BUN 01/23/2023 20    Creatinine, Ser 01/23/2023 1.41 (H)    Calcium 01/23/2023 8.8 (L)    Total Protein 01/23/2023 7.4    Albumin 01/23/2023 3.6    AST 01/23/2023 24    ALT 01/23/2023 12    Alkaline Phosphatase 01/23/2023 60    Total Bilirubin 01/23/2023 1.2    GFR, Estimated 01/23/2023 58 (L)    Anion gap 01/23/2023 15    Troponin I (High Sensiti* 01/23/2023 31 (H)    D-Dimer, Quant 01/23/2023 10.31 (H)    WBC 01/23/2023 7.0    RBC 01/23/2023 5.64    Hemoglobin 01/23/2023 12.4 (L)    HCT 01/23/2023 38.0 (L)    MCV 01/23/2023 67.4 (L)    MCH 01/23/2023 22.0 (L)    MCHC 01/23/2023 32.6    RDW 01/23/2023 20.1 (H)    Platelets 01/23/2023 231    nRBC 01/23/2023 0.0    Creatinine, Ser 01/23/2023 1.39 (H)    GFR, Estimated 01/23/2023 59 (L)    Sodium 01/24/2023 137    Potassium 01/24/2023 3.4 (L)    Chloride 01/24/2023 99    CO2 01/24/2023 26    Glucose, Bld 01/24/2023 110 (H)    BUN 01/24/2023 21 (H)    Creatinine, Ser 01/24/2023 1.32 (H)    Calcium 01/24/2023 8.8 (L)    GFR, Estimated 01/24/2023 >60    Anion gap 01/24/2023 12    Magnesium 01/24/2023 1.7    WBC 01/24/2023 6.0    RBC 01/24/2023 5.63    Hemoglobin 01/24/2023 12.2 (L)    HCT 01/24/2023 38.1 (L)    MCV 01/24/2023 67.7 (L)    MCH 01/24/2023 21.7 (L)    MCHC 01/24/2023 32.0    RDW 01/24/2023 19.8 (H)    Platelets 01/24/2023 215    nRBC 01/24/2023 0.0    Neutrophils Relative % 01/24/2023  58    Neutro Abs 01/24/2023 3.5    Lymphocytes Relative 01/24/2023 30    Lymphs Abs 01/24/2023 1.8    Monocytes Relative 01/24/2023 4    Monocytes Absolute 01/24/2023 0.2    Eosinophils Relative 01/24/2023 4    Eosinophils Absolute 01/24/2023 0.2    Basophils Relative 01/24/2023 4    Basophils Absolute 01/24/2023 0.2 (H)    nRBC 01/24/2023 0    Abs Immature Granulocytes 01/24/2023 0.00    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    Weight 01/24/2023 4,761.94    Height 01/24/2023 71    BP 01/24/2023 121/80    S' Lateral 01/24/2023 3.40    Est EF 01/24/2023 55 - 60%    HIV Screen 4th Generatio* 01/24/2023 Non Reactive    TSH 01/24/2023 3.562    Hgb A1c MFr Bld 01/25/2023 6.1 (H)    Mean Plasma Glucose 01/25/2023 128.37    Sodium 01/25/2023 135    Potassium 01/25/2023 3.8    Chloride 01/25/2023 100    CO2 01/25/2023 23    Glucose, Bld 01/25/2023 173 (  H)    BUN 01/25/2023 23 (H)    Creatinine, Ser 01/25/2023 1.23    Calcium 01/25/2023 9.0    GFR, Estimated 01/25/2023 >60    Anion gap 01/25/2023 12   Admission on 10/02/2022, Discharged on 10/02/2022  Component Date Value   WBC 10/02/2022 6.8    RBC 10/02/2022 5.78    Hemoglobin 10/02/2022 13.2    Hematocrit 10/02/2022 42.2    MCV 10/02/2022 73 (L)    MCH 10/02/2022 22.8 (L)    MCHC 10/02/2022 31.3 (L)    RDW 10/02/2022 20.1 (H)    Platelets 10/02/2022 228    Neutrophils 10/02/2022 48    Lymphs 10/02/2022 34    Monocytes 10/02/2022 12    Eos 10/02/2022 3    Basos 10/02/2022 2    Neutrophils Absolute 10/02/2022 3.3    Lymphocytes Absolute 10/02/2022 2.3    Monocytes Absolute 10/02/2022 0.8    EOS (ABSOLUTE) 10/02/2022 0.2    Basophils Absolute 10/02/2022 0.1    Immature Granulocytes 10/02/2022 1    Immature Grans (Abs) 10/02/2022 0.1    Glucose 10/02/2022 86    BUN 10/02/2022 9    Creatinine, Ser 10/02/2022 0.96    eGFR 10/02/2022 93    BUN/Creatinine Ratio 10/02/2022 9    Sodium 10/02/2022 141    Potassium 10/02/2022  4.6    Chloride 10/02/2022 103    CO2 10/02/2022 23    Calcium 10/02/2022 9.3    Total Protein 10/02/2022 6.8    Albumin 10/02/2022 4.2    Globulin, Total 10/02/2022 2.6    Albumin/Globulin Ratio 10/02/2022 1.6    Bilirubin Total 10/02/2022 0.9    Alkaline Phosphatase 10/02/2022 61    AST 10/02/2022 27    ALT 10/02/2022 16    No image results found.   DG Hand 2 View Right  Result Date: 06/13/2023 CLINICAL DATA:  Right hand pain for 1 month.  No known injury EXAM: RIGHT HAND - 2 VIEW COMPARISON:  None Available. FINDINGS: There is no evidence of fracture or dislocation. 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 No erosion. No abnormal soft tissue mineralization. Mild generalized soft tissue swelling. 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 No acute findings. Electronically Signed   By: Duanne Guess D.O.   On: 06/13/2023 11:43    DG Hand 2 View Right  Result Date: 06/13/2023 CLINICAL DATA:  Right hand pain for 1 month.  No known injury EXAM: RIGHT HAND - 2 VIEW COMPARISON:  None Available. FINDINGS: There is no evidence of fracture or dislocation. 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 No erosion. No abnormal soft tissue mineralization. Mild generalized soft tissue swelling. 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 No acute findings. Electronically Signed   By: Duanne Guess D.O.   On: 06/13/2023 11:43      This encounter employed real-time, collaborative documentation. The patient actively reviewed and updated their medical record on a shared screen, ensuring transparency and facilitating joint problem-solving for the problem list, overview, and plan. This approach promotes accurate, informed care. The treatment plan was discussed and reviewed in detail, including medication  safety, potential side effects, and all patient questions. We confirmed understanding and comfort with the plan. Follow-up instructions were established, including contacting the office for any concerns, returning if symptoms worsen, persist, or new symptoms develop, and precautions for potential emergency  department visits. ----------------------------------------------------- Lula Olszewski, MD  07/09/2023 8:20 PM  [facility]:  [phone]

## 2023-07-09 NOTE — Assessment & Plan Note (Signed)
We discussed the impact of sleep apnea on their metabolic disorder. They are encouraged to continue with weight loss efforts and their medication regimen.

## 2023-07-09 NOTE — Assessment & Plan Note (Signed)
They report experiencing vertigo. The Epley maneuver is recommended, and a potential referral to a vestibular rehab team if symptoms persist.

## 2023-07-09 NOTE — Assessment & Plan Note (Signed)
They report hand pain and swelling with a previous diagnosis of gout. A referral to a hand surgeon for further evaluation has been made.

## 2023-07-11 NOTE — Progress Notes (Signed)
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 Adjust allopurinol for uric acid, increase it to reduce gout risk. Slight increase in levothyroxine hormone supplement to 62.5 mcg daily, or just more regular dosing, or if he wants to stay homeopathic, close follow up. Continue current diabetes management Monitor kidney function closely. Labs in 12 weeks recommended  Please call patient to:  Confirm understanding of test results and plan Schedule 4-week follow-up appointment Remind about 86-month repeat blood tests Ensure patient has appointments with cardiology and endocrinology  Emphasize medication adherence, hydration, and lifestyle modifications (diet, exercise). If patient reports any new symptoms or concerns, please notify me immediately.

## 2023-07-16 ENCOUNTER — Other Ambulatory Visit: Payer: Self-pay | Admitting: Orthopedic Surgery

## 2023-07-16 ENCOUNTER — Other Ambulatory Visit: Payer: Self-pay

## 2023-07-17 ENCOUNTER — Encounter (INDEPENDENT_AMBULATORY_CARE_PROVIDER_SITE_OTHER): Payer: Self-pay

## 2023-07-29 ENCOUNTER — Telehealth (HOSPITAL_COMMUNITY): Payer: Self-pay | Admitting: Cardiology

## 2023-07-29 NOTE — Telephone Encounter (Signed)
Medical clearance completed by Dr [email or web address] Patient is approved to proceed with procedure excisional biopsy   from a cardiac standpoint. Medication prep- xarelto x 2 days as needed  Faxed to [facility]  Attn Dr Betha Loa Fax # [phone]

## 2023-07-31 ENCOUNTER — Telehealth: Payer: Self-pay | Admitting: Cardiology

## 2023-07-31 NOTE — Telephone Encounter (Signed)
Pt is calling back and would like to speak with [name] regarding his sleep study

## 2023-08-01 ENCOUNTER — Telehealth: Payer: Self-pay | Admitting: Internal Medicine

## 2023-08-01 NOTE — Telephone Encounter (Signed)
Pt returning nurse's call. Please advise

## 2023-08-01 NOTE — Telephone Encounter (Signed)
Pt called and wanted to know if he can take Allopurinol and Prednisone at the same time or at separate times. Please call pt back with details. Please advise.

## 2023-08-05 ENCOUNTER — Encounter: Payer: Self-pay | Admitting: Pharmacist

## 2023-08-06 NOTE — Telephone Encounter (Signed)
Called patient and advised him on this.

## 2023-08-06 NOTE — Telephone Encounter (Signed)
Left message in Mychart and informed patient to call back. Latrelle Dodrill, CMA.

## 2023-08-07 ENCOUNTER — Ambulatory Visit (HOSPITAL_BASED_OUTPATIENT_CLINIC_OR_DEPARTMENT_OTHER): Admit: 2023-08-07 | Payer: PPO | Admitting: Orthopedic Surgery

## 2023-08-07 ENCOUNTER — Encounter (HOSPITAL_BASED_OUTPATIENT_CLINIC_OR_DEPARTMENT_OTHER): Payer: Self-pay

## 2023-08-07 SURGERY — EXCISION MASS
Anesthesia: Choice | Laterality: Right

## 2023-08-11 ENCOUNTER — Encounter: Payer: Self-pay | Admitting: Internal Medicine

## 2023-08-11 ENCOUNTER — Ambulatory Visit (INDEPENDENT_AMBULATORY_CARE_PROVIDER_SITE_OTHER): Payer: PPO | Admitting: Internal Medicine

## 2023-08-11 VITALS — BP 128/74 | HR 65 | Temp 97.8°F | Ht 71.0 in | Wt 265.4 lb

## 2023-08-11 DIAGNOSIS — I5032 Chronic diastolic (congestive) heart failure: Secondary | ICD-10-CM | POA: Diagnosis not present

## 2023-08-11 DIAGNOSIS — M109 Gout, unspecified: Secondary | ICD-10-CM

## 2023-08-11 DIAGNOSIS — F32A Depression, unspecified: Secondary | ICD-10-CM | POA: Diagnosis not present

## 2023-08-11 DIAGNOSIS — I50813 Acute on chronic right heart failure: Secondary | ICD-10-CM

## 2023-08-11 DIAGNOSIS — E785 Hyperlipidemia, unspecified: Secondary | ICD-10-CM

## 2023-08-11 DIAGNOSIS — I10 Essential (primary) hypertension: Secondary | ICD-10-CM

## 2023-08-11 DIAGNOSIS — Z789 Other specified health status: Secondary | ICD-10-CM

## 2023-08-11 DIAGNOSIS — I482 Chronic atrial fibrillation, unspecified: Secondary | ICD-10-CM

## 2023-08-11 DIAGNOSIS — D508 Other iron deficiency anemias: Secondary | ICD-10-CM

## 2023-08-11 DIAGNOSIS — Z79899 Other long term (current) drug therapy: Secondary | ICD-10-CM

## 2023-08-11 MED ORDER — SPIRONOLACTONE 25 MG PO TABS
37.5000 mg | ORAL_TABLET | Freq: Every day | ORAL | 3 refills | Status: DC
Start: 2023-08-11 — End: 2024-02-11

## 2023-08-11 MED ORDER — NALTREXONE HCL 50 MG PO TABS
25.0000 mg | ORAL_TABLET | Freq: Every day | ORAL | 1 refills | Status: DC
Start: 2023-08-11 — End: 2024-08-27

## 2023-08-11 NOTE — Assessment & Plan Note (Signed)
After discussion acamprosate/naltrexone he wants more.

## 2023-08-11 NOTE — Assessment & Plan Note (Signed)
10 minute discussion of gout today: He is taking Allopurinol for gout, understanding the necessity of daily use to maintain low uric acid levels and prevent attacks. Despite an interest in reducing medication load, we will continue Allopurinol daily and check uric acid levels today.  # After-Visit Summary: Gout and Allopurinol Treatment  ## Your Condition You have been diagnosed with severe gouty arthritis. Your recent uric acid level was 10.5, which is higher than the normal range.  ## About Allopurinol  ### How it works Allopurinol is a medication that helps lower uric acid levels in your body. It does this by: - Blocking an enzyme called xanthine oxidase - Reducing the production of uric acid - Helping prevent gout attacks and joint damage  ### How it helps you - Lowers your uric acid levels - Reduces the frequency and severity of gout attacks - Helps prevent long-term joint damage - May dissolve existing uric acid crystals over time  ### Usage instructions - Take allopurinol regularly as prescribed by your doctor - It's typically a daily medication, not for occasional use - Consistent use is key for maintaining low uric acid levels  ## Other Treatment Options  While allopurinol is effective, we understand your concern about polypharmacy. Here are some additional options that may complement or, in some cases, replace medication:  1. Diet modifications:    - Reduce intake of purine-rich foods (red meat, organ meats, some seafoods)    - Limit alcohol consumption, especially beer    - Avoid high-fructose corn syrup  2. Lifestyle changes:    - Maintain a healthy weight    - Stay hydrated    - Regular exercise (low-impact activities during flare-ups)  3. Cherry juice or extract:    - May help reduce uric acid levels and inflammation    - Discuss appropriate dosage with your healthcare provider  4. Vitamin C supplementation:    - May help lower uric acid levels    -  Consult your doctor for proper dosing  5. Topical treatments:    - Ice packs for pain relief during attacks    - Topical NSAIDs (if prescribed) for localized pain  Remember, these options may complement but not necessarily replace your prescribed medication. Always consult with your healthcare provider before making changes to your treatment plan.  ## Follow-up - Next appointment: [Date/Time] - Uric acid level check: every 3 month suggest   If you have any questions or concerns, please contact our office at [facility] Number].

## 2023-08-12 ENCOUNTER — Encounter: Payer: Self-pay | Admitting: Internal Medicine

## 2023-08-12 DIAGNOSIS — E538 Deficiency of other specified B group vitamins: Secondary | ICD-10-CM | POA: Insufficient documentation

## 2023-08-12 DIAGNOSIS — N182 Chronic kidney disease, stage 2 (mild): Secondary | ICD-10-CM | POA: Insufficient documentation

## 2023-08-12 DIAGNOSIS — K760 Fatty (change of) liver, not elsewhere classified: Secondary | ICD-10-CM | POA: Insufficient documentation

## 2023-08-12 LAB — CBC WITH DIFFERENTIAL/PLATELET
Basophils Absolute: 0.1 10*3/uL (ref 0.0–0.1)
Basophils Relative: 1.6 % (ref 0.0–3.0)
Eosinophils Absolute: 0.1 10*3/uL (ref 0.0–0.7)
Eosinophils Relative: 1.5 % (ref 0.0–5.0)
HCT: 49.3 % (ref 39.0–52.0)
Hemoglobin: 15.4 g/dL (ref 13.0–17.0)
Lymphocytes Relative: 28.5 % (ref 12.0–46.0)
Lymphs Abs: 1.6 10*3/uL (ref 0.7–4.0)
MCHC: 31.2 g/dL (ref 30.0–36.0)
MCV: 84.8 fl (ref 78.0–100.0)
Monocytes Absolute: 0.7 10*3/uL (ref 0.1–1.0)
Monocytes Relative: 12 % (ref 3.0–12.0)
Neutro Abs: 3.2 10*3/uL (ref 1.4–7.7)
Neutrophils Relative %: 56.4 % (ref 43.0–77.0)
Platelets: 250 10*3/uL (ref 150.0–400.0)
RBC: 5.81 Mil/uL (ref 4.22–5.81)
RDW: 15.7 % — ABNORMAL HIGH (ref 11.5–15.5)
WBC: 5.6 10*3/uL (ref 4.0–10.5)

## 2023-08-12 LAB — B12 AND FOLATE PANEL
Folate: 4 ng/mL — ABNORMAL LOW (ref >5.9)
Vitamin B-12: 176 pg/mL — ABNORMAL LOW (ref 211–911)

## 2023-08-12 LAB — COMPREHENSIVE METABOLIC PANEL
ALT: 48 U/L (ref 0–53)
AST: 88 U/L — ABNORMAL HIGH (ref 0–37)
Albumin: 4.1 g/dL (ref 3.5–5.2)
Alkaline Phosphatase: 83 U/L (ref 39–117)
BUN: 8 mg/dL (ref 6–23)
CO2: 34 meq/L — ABNORMAL HIGH (ref 19–32)
Calcium: 9.4 mg/dL (ref 8.4–10.5)
Chloride: 98 meq/L (ref 96–112)
Creatinine, Ser: 1.02 mg/dL (ref 0.40–1.50)
GFR: 81.41 mL/min (ref >60.00)
Glucose, Bld: 96 mg/dL (ref 70–99)
Potassium: 3.5 meq/L (ref 3.5–5.1)
Sodium: 144 meq/L (ref 135–145)
Total Bilirubin: 1 mg/dL (ref 0.2–1.2)
Total Protein: 7.3 g/dL (ref 6.0–8.3)

## 2023-08-12 LAB — LIPID PANEL
Cholesterol: 202 mg/dL — ABNORMAL HIGH (ref 0–200)
HDL: 55.2 mg/dL (ref >39.00)
LDL Cholesterol: 110 mg/dL — ABNORMAL HIGH (ref 0–99)
NonHDL: 146.38
Total CHOL/HDL Ratio: 4
Triglycerides: 183 mg/dL — ABNORMAL HIGH (ref 0.0–149.0)
VLDL: 36.6 mg/dL (ref 0.0–40.0)

## 2023-08-12 LAB — URIC ACID: Uric Acid, Serum: 9.8 mg/dL — ABNORMAL HIGH (ref 4.0–7.8)

## 2023-08-12 LAB — MAGNESIUM: Magnesium: 1.4 mg/dL — ABNORMAL LOW (ref 1.5–2.5)

## 2023-08-12 LAB — FERRITIN: Ferritin: 265.7 ng/mL (ref 22.0–322.0)

## 2023-08-12 NOTE — Patient Instructions (Signed)
VISIT SUMMARY:  During our visit, we discussed your concerns about the number of medications you are currently taking and your desire to manage your health conditions more holistically. We reviewed your current medication regimen and made some adjustments based on your needs and preferences. We also discussed your lifestyle changes, including your efforts to reduce alcohol consumption and your interest in dietary modifications and homeopathic remedies.  YOUR PLAN:  -HYPERTENSION: Your high blood pressure is well-controlled with your current regimen, which includes regular exercise and a healthy diet. We will continue with this plan.  -ATRIAL FIBRILLATION: You are taking Amiodarone to maintain a regular heart rhythm. We discussed the importance of this medication and the risks of stopping it. We will refer you to a cardiologist to discuss alternative treatments.  -ALCOHOL USE: You are making efforts to reduce your alcohol consumption. We discussed the benefits of Naltrexone, a medication that can help reduce the desire to drink. We will prescribe this medication to assist you.  -DEPRESSION: You have been managing your mood without prescribed medication. We will discontinue your antidepressant medication.  -GOUT: You are taking Allopurinol to manage your gout. This medication helps maintain low uric acid levels and prevent gout attacks. We will continue with this medication and check your uric acid levels.  -HYPERLIPIDEMIA: You are currently on a cholesterol pill. We discussed the possibility of switching to Repatha injections in the future. We will revisit this topic at your next visit.  -FLUID RETENTION: You are taking Torsemide and Spironolactone for fluid retention. We discussed adjusting these medications to better manage your fluid retention and maintain your potassium levels. We will increase your Spironolactone, discontinue Torsemide, and monitor your potassium levels. Will also check fluid  at next visit.  INSTRUCTIONS:  We will follow up in one month to discuss your cholesterol management and review your lab results. Please schedule an appointment with a cardiologist to discuss your atrial fibrillation treatment options. Remember to take your prescribed medications as directed and continue with your lifestyle modifications.

## 2023-08-12 NOTE — Progress Notes (Signed)
Call and order if he agrees to the clinical Plan:  Increase allopurinol to 300mg  daily for gout management Increase rosuvastatin to 20mg  daily for cholesterol Start vitamin B12 daily and folic acid 1mg  daily order liver ultrasound MyChart encounter in 1 month for depression follow-up

## 2023-08-12 NOTE — Assessment & Plan Note (Signed)
He is on Amiodarone for atrial fibrillation, understanding its importance in maintaining heart rhythm and the risks of discontinuation. He showed interest in exploring alternative treatments like ablation. We will refer him to cardiology for further discussion and management.

## 2023-08-12 NOTE — Progress Notes (Signed)
[facility]: [phone]   -- Medical Office Visit --  Patient:  Parker [name]      Age: 57 y.o.       Sex:  male  Date:   08/11/2023 Patient Care Team: Lula Olszewski, MD as PCP - General (Internal Medicine) Quintella Reichert, MD as Consulting Physician (Sleep Medicine) Bensimhon, Bevelyn Buckles, MD as Consulting Physician (Cardiology) Today's Healthcare Provider: Lula Olszewski, MD      Assessment & Plan Polypharmacy We attempted to reduce med Alcohol use After 5 discussion acamprosate/naltrexone he wants naltrexone based on my recommendations He is attempting to reduce alcohol consumption to mitigate its impact on his health, especially gout. We discussed the benefits of Naltrexone in reducing the desire to drink. We will prescribe Naltrexone to aid in reducing alcohol consumption. Depression, unspecified depression type His mood has been stable managing without prescribed medication. We will discontinue antidepressant medication. Acute gout of left wrist, unspecified cause 10 minute discussion of gout today: He is taking Allopurinol for gout, understanding the necessity of daily use to maintain low uric acid levels and prevent attacks. Despite an interest in reducing medication load, we will continue Allopurinol daily and check uric acid levels today.  # After-Visit Summary: Gout and Allopurinol Treatment  ## Your Condition You have been diagnosed with severe gouty arthritis. Your recent uric acid level was 10.5, which is higher than the normal range.  ## About Allopurinol  ### How it works Allopurinol is a medication that helps lower uric acid levels in your body. It does this by: - Blocking an enzyme called xanthine oxidase - Reducing the production of uric acid - Helping prevent gout attacks and joint damage  ### How it helps you - Lowers your uric acid levels - Reduces the frequency and severity of gout attacks - Helps prevent long-term joint  damage - May dissolve existing uric acid crystals over time  ### Usage instructions - Take allopurinol regularly as prescribed by your doctor - It's typically a daily medication, not for occasional use - Consistent use is key for maintaining low uric acid levels  ## Other Treatment Options  While allopurinol is effective, we understand your concern about polypharmacy. Here are some additional options that may complement or, in some cases, replace medication:  1. Diet modifications:    - Reduce intake of purine-rich foods (red meat, organ meats, some seafoods)    - Limit alcohol consumption, especially beer    - Avoid high-fructose corn syrup  2. Lifestyle changes:    - Maintain a healthy weight    - Stay hydrated    - Regular exercise (low-impact activities during flare-ups)  3. Cherry juice or extract:    - May help reduce uric acid levels and inflammation    - Discuss appropriate dosage with your healthcare provider  4. Vitamin C supplementation:    - May help lower uric acid levels    - Consult your doctor for proper dosing  5. Topical treatments:    - Ice packs for pain relief during attacks    - Topical NSAIDs (if prescribed) for localized pain  Remember, these options may complement but not necessarily replace your prescribed medication. Always consult with your healthcare provider before making changes to your treatment plan.  ## Follow-up - Next appointment: [Date/Time] - Uric acid level check: every 3 month suggest   If you have any questions or concerns, please contact our office at [facility] Number]. Acute on chronic  right-sided congestive heart failure (HCC)  Other iron deficiency anemia  Chronic heart failure with preserved ejection fraction (HCC) He is on Torsemide and Spironolactone for fluid retention. We discussed increasing Spironolactone and discontinuing Torsemide to better manage fluid retention and maintain potassium levels. We will increase  Spironolactone, discontinue Torsemide, check potassium levels today, and recheck in one month.  Reason for this change is desire to reduce medications and he has been having to take potassium supplement to cope with torsemide. Hypertension, unspecified type  Chronic atrial fibrillation (HCC) He is on Amiodarone for atrial fibrillation, understanding its importance in maintaining heart rhythm and the risks of discontinuation. He showed interest in exploring alternative treatments like ablation. We will refer him to cardiology for further discussion and management. Hyperlipidemia, unspecified hyperlipidemia type He is on a cholesterol pill, with a future possibility of switching to Repatha injections discussed. We will consider discussing the switch to Repatha injections at the next visit. Goal < 55 due to chf  ED Discharge Orders          Ordered    naltrexone (DEPADE) 50 MG tablet  Daily        08/11/23 1417    Comp Met (CMET)        08/11/23 1417    Magnesium        08/11/23 1417    Uric acid        08/11/23 1417    spironolactone (ALDACTONE) 25 MG tablet  Daily        08/11/23 1417    CBC with Differential/Platelet        08/11/23 1417    Ferritin        08/11/23 1417    B12 and Folate Panel        08/11/23 1417    Lipid panel        08/11/23 1417          Diagnoses and all orders for this visit: Polypharmacy Alcohol use -     naltrexone (DEPADE) 50 MG tablet; Take 0.5 tablets (25 mg total) by mouth daily. Depression, unspecified depression type Acute gout of left wrist, unspecified cause -     Uric acid -     B12 and Folate Panel Acute on chronic right-sided congestive heart failure (HCC) -     Comp Met (CMET) -     spironolactone (ALDACTONE) 25 MG tablet; Take 1.5 tablets (37.5 mg total) by mouth daily. Increased dose, to allow to stop torsemide Other iron deficiency anemia -     CBC with Differential/Platelet -     Ferritin Chronic heart failure with preserved  ejection fraction (HCC) -     Magnesium -     Lipid panel Hypertension, unspecified type Chronic atrial fibrillation (HCC) Hyperlipidemia, unspecified hyperlipidemia type  Recommended follow-up: No follow-ups on file. Future Appointments  Date Time Provider Department Center  09/10/2023  1:20 PM Lula Olszewski, MD [facility]  01/07/2024  2:00 PM Lula Olszewski, MD [facility]            Subjective   57 y.o. male who has Plantar fasciitis of right foot; Deformity of metatarsal; Acquired equinus deformity of foot; Chronic heart failure with preserved ejection fraction (HCC); Controlled type 2 diabetes mellitus without complication, without long-term current use of insulin (HCC); Unspecified atrial fibrillation (HCC); HTN (hypertension); Hypothyroidism; Gout; Obesity, Class III, BMI 40-49.9 (morbid obesity) (HCC); OSA (obstructive sleep apnea); IDA (iron deficiency anemia); Kidney injury; Vertigo; High risk for  readmission; Hyperlipidemia; Erectile dysfunction; History of stroke; hypokalemia, drug induced; Alcohol use; Cervical spondylosis; Chronic neck pain; Depression; Diverticulosis of large intestine without hemorrhage; Edema of both lower extremities; Erectile dysfunction due to arterial insufficiency; Idiopathic chronic gout 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; Large liver; H/O medication noncompliance; LVH (left ventricular hypertrophy); Lymphedema; Mitral valve regurgitation; Prediabetes; Severe obesity (BMI 35.0-39.9) with comorbidity (HCC); Transaminitis; Tubular adenoma of colon; Arthralgia of left ankle; Left knee pain; Hypomagnesemia; Positive cardiac stress test; Primary localized osteoarthritis of left knee; Tricuspid valve regurgitation; Alpha thalassaemia minor; Swelling of hand; Pain in right hand; and Pain involving joints of fingers of both hands on their problem list. His reasons/main concerns/chief complaints for today's office visit are 1 month follow-up    ------------------------------------------------------------------------------------------------------------------------ AI-Extracted: Discussed the use of AI scribe software for clinical note transcription with the patient, who gave verbal consent to proceed.  History of Present Illness   The patient, with a history of hypertension, gout, and atrial fibrillation, presents for a follow-up visit. He reports good control of his blood pressure, attributing this to regular exercise, dietary modifications, and medication adherence. However, he expresses concern about the number of medications he is currently taking, describing it as a "whole meal full of pills every day." He believes the medications are hindering his muscle growth and overall physical fitness, which he had previously enjoyed before starting the medications.  The patient has a history of alcohol use and is actively trying to reduce his intake due to concerns about its impact on his health, particularly his gout. He reports occasional feelings of anxiety and depression, for which he has been prescribed medication. However, he has not been taking these regularly, instead opting to stay active and engage in different activities to manage his mood. He has also been prescribed Trazodone for sleep, which he does not take regularly.  The patient has a history of stroke and is currently on Xarelto. He expresses a desire to reduce his medication load and inquire about the possibility of stopping some of his medications, particularly Amiodarone, which he is taking for his heart rhythm. He reports that he has not seen his cardiologist since his hospital discharge in March and plans to schedule a follow-up appointment to discuss his medication regimen.  The patient also reports a history of iron deficiency anemia and is currently taking potassium chloride supplements. He has been prescribed Torsemide and Spironolactone for fluid retention, which he  believes is affecting his potassium levels. He expresses a desire to potentially switch to a different medication regimen to manage his fluid retention and potassium levels.  The patient has a history of severe gouty arthritis, particularly affecting his hands. He has been taking Allopurinol to manage his uric acid levels and reports improvement in his hand pain. However, he expresses a desire to potentially stop Allopurinol and manage his gout through dietary modifications and homeopathic remedies. He also reports a cyst on his hand, which he has seen a hand surgeon for, and expresses concern about potential surgery due to its location over a vein.  The patient is considering returning to work and is scheduled to start training classes. He expresses a desire to reduce his medication load and manage his health conditions more holistically, including through dietary modifications and homeopathic remedies. He is considering taking Naltrexone to help reduce his alcohol intake and is open to the possibility of switching his cholesterol medication to Repatha injections.      He has a past medical history of (HFpEF)  heart failure with preserved ejection fraction (HCC), Acute on chronic heart failure (HCC) (05/16/2022), AKI (acute kidney injury) (HCC) (01/23/2023), Arthritis, Cataract, CHF (congestive heart failure) (HCC), Diabetes mellitus without complication (HCC), Diastolic CHF (HCC) (02/14/2023), Dyspnea, Dysrhythmia, GERD (gastroesophageal reflux disease), History of COVID-19 (05/18/2021), Hypertension, Localized swelling of left lower extremity (01/11/2020), Localized swelling of right lower extremity (04/24/2020), Muscle spasm, OSA on CPAP (03/07/2023), Peripheral edema, Plantar fasciitis, Situational insomnia (01/06/2018), Sleep apnea, and Sprain of deltoid ligament of left ankle (08/10/2019).  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 Adjust allopurinol dose to prevent gout flares.   Alcohol Use   Trying to cut down    Current Outpatient Medications on File Prior to Visit  Medication Sig   albuterol (VENTOLIN HFA) 108 (90 Base) MCG/ACT inhaler Inhale 2 puffs into the lungs every 6 (six) hours as needed for shortness of breath.   allopurinol (ZYLOPRIM) 300 MG tablet Take 1 tablet (300 mg total) by mouth daily.   augmented betamethasone dipropionate (DIPROLENE-AF) 0.05 % cream Apply topically 2 (two) times daily as needed.   buPROPion ER (WELLBUTRIN SR) 100 MG 12 hr tablet Take 1 tablet (100 mg total) by mouth in the morning.   Clotrimazole 1 % OINT Apply 1 Application topically 2 (two) times a week.   colchicine 0.6 MG tablet Take 1 tablet (0.6 mg total) by mouth daily as needed (gout flares).   empagliflozin (JARDIANCE) 10 MG TABS tablet Take 1 tablet (10 mg total) by mouth daily.   erythromycin ophthalmic ointment 3 (three) times daily.   fluticasone (FLONASE) 50 MCG/ACT nasal spray Place 2 sprays into both nostrils daily. (Patient taking differently: Place 2 sprays into both nostrils daily as needed for allergies.)   folic acid (FOLVITE) 1 MG tablet Take 1 mg by mouth daily as needed (low iron).   gabapentin (NEURONTIN) 300 MG capsule Take 1 capsule (300 mg total) by mouth 2 (two) times daily.   levofloxacin (LEVAQUIN) 500 MG tablet TAKE 1 TABLET BY MOUTH EVERY DAY FOR 10 DAYS   levothyroxine (SYNTHROID) 50 MCG tablet Take 1 tablet (50 mcg total) by mouth daily. Needs to be adjusted every 3-6 months based on labwork if still taking amiodarone   losartan (COZAAR) 50 MG tablet Take 1 tablet by mouth daily.   meclizine (ANTIVERT) 25 MG tablet Take 25 mg by mouth 3 (three) times daily as needed for dizziness.   metoprolol succinate (TOPROL-XL) 50 MG 24 hr tablet Take 1 tablet (50 mg total) by mouth daily.   Multiple  Vitamins-Minerals (MULTIVITAMIN WITH MINERALS) tablet Take 1 tablet by mouth daily.   potassium chloride (KLOR-CON) 10 MEQ tablet Take 1 tablet (10 mEq total) by mouth daily. Take with torsemide because torsemide lowers potassium   prednisoLONE acetate (PRED FORTE) 1 % ophthalmic suspension Place into the right eye.   predniSONE (STERAPRED UNI-PAK 48 TAB) 10 MG (48) TBPK tablet See admin instructions. see package   rivaroxaban (XARELTO) 20 MG TABS tablet Take 1 tablet (20 mg total) by mouth daily with supper.   rosuvastatin (CRESTOR) 20 MG tablet Take 1 tablet (20 mg total) by mouth daily. Replaces 10 mg dose, can cause cramps   sacubitril-valsartan (ENTRESTO) 49-51 MG Take 1 tablet by mouth 2 (two) times daily.   Semaglutide,0.25 or 0.5MG /DOS, (OZEMPIC, 0.25 OR 0.5 MG/DOSE,) 2 MG/3ML SOPN Inject 0.5  mg into the skin once a week.   traMADol (ULTRAM) 50 MG tablet Take 1 tablet by mouth every 8 (eight) hours as needed.   amiodarone (PACERONE) 200 MG tablet Take 2 tablets (400 mg total) by mouth 2 (two) times daily for 7 days, THEN 1 tablet (200 mg total) 2 (two) times daily for 14 days, THEN 1 tablet (200 mg total) daily.   No current facility-administered medications on file prior to visit.   Medications Discontinued During This Encounter  Medication Reason   tadalafil (CIALIS) 20 MG tablet Completed Course   traZODone (DESYREL) 50 MG tablet Completed Course   acetaminophen (TYLENOL) 500 MG tablet Completed Course   torsemide (DEMADEX) 20 MG tablet Completed Course   spironolactone (ALDACTONE) 25 MG tablet Reorder     Objective   Physical Exam  BP 128/74 (BP Location: Right Arm, Patient Position: Sitting)   Pulse 65   Temp 97.8 F (36.6 C) (Temporal)   Ht 5\' 11"  (1.803 m)   Wt 265 lb 6.4 oz (120.4 kg)   SpO2 97%   BMI 37.02 kg/m  Wt Readings from Last 10 Encounters:  08/11/23 265 lb 6.4 oz (120.4 kg)  07/09/23 258 lb 6.4 oz (117.2 kg)  06/13/23 260 lb (117.9 kg)  05/29/23 262 lb  12.8 oz (119.2 kg)  04/07/23 270 lb 3.2 oz (122.6 kg)  03/07/23 280 lb 6.4 oz (127.2 kg)  03/03/23 279 lb 6.4 oz (126.7 kg)  02/14/23 296 lb 9.6 oz (134.5 kg)  01/25/23 295 lb 3.2 oz (133.9 kg)  03/11/22 287 lb (130.2 kg)   Vital signs reviewed.  Nursing notes reviewed. Weight trend reviewed. Abnormalities and Problem-Specific physical exam findings:  minimal fluid on legs, highly intelligent and insightful about complex care needs  General Appearance:  No acute distress appreciable.   Well-groomed, healthy-appearing male.  Well proportioned with no abnormal fat distribution.  Good muscle tone. Pulmonary:  Normal work of breathing at rest, no respiratory distress apparent. SpO2: 97 %  Musculoskeletal: All extremities are intact.  Neurological:  Awake, alert, oriented, and engaged.  No obvious focal neurological deficits or cognitive impairments.  Sensorium seems unclouded.   Speech is clear and coherent with logical content. Psychiatric:  Appropriate mood, pleasant and cooperative demeanor, thoughtful and engaged during the exam  Results   LABS Uric Acid: 10.5 mg/dL TSH: pending (53/6644)        Results for orders placed or performed in visit on 08/11/23  Comp Met (CMET)  Result Value Ref Range   Sodium 144 135 - 145 mEq/L   Potassium 3.5 3.5 - 5.1 mEq/L   Chloride 98 96 - 112 mEq/L   CO2 34 (H) 19 - 32 mEq/L   Glucose, Bld 96 70 - 99 mg/dL   BUN 8 6 - 23 mg/dL   Creatinine, Ser 0.34 0.40 - 1.50 mg/dL   Total Bilirubin 1.0 0.2 - 1.2 mg/dL   Alkaline Phosphatase 83 39 - 117 U/L   AST 88 (H) 0 - 37 U/L   ALT 48 0 - 53 U/L   Total Protein 7.3 6.0 - 8.3 g/dL   Albumin 4.1 3.5 - 5.2 g/dL   GFR 74.25 >95.63 mL/min   Calcium 9.4 8.4 - 10.5 mg/dL  Magnesium  Result Value Ref Range   Magnesium 1.4 (L) 1.5 - 2.5 mg/dL  Uric acid  Result Value Ref Range   Uric Acid, Serum 9.8 (H) 4.0 - 7.8 mg/dL  CBC with Differential/Platelet  Result Value Ref  Range   WBC 5.6 4.0 - 10.5 K/uL    RBC 5.81 4.22 - 5.81 Mil/uL   Hemoglobin 15.4 13.0 - 17.0 g/dL   HCT 82.9 56.2 - 13.0 %   MCV 84.8 78.0 - 100.0 fl   MCHC 31.2 30.0 - 36.0 g/dL   RDW 86.5 (H) 78.4 - 69.6 %   Platelets 250.0 150.0 - 400.0 K/uL   Neutrophils Relative % 56.4 43.0 - 77.0 %   Lymphocytes Relative 28.5 12.0 - 46.0 %   Monocytes Relative 12.0 3.0 - 12.0 %   Eosinophils Relative 1.5 0.0 - 5.0 %   Basophils Relative 1.6 0.0 - 3.0 %   Neutro Abs 3.2 1.4 - 7.7 K/uL   Lymphs Abs 1.6 0.7 - 4.0 K/uL   Monocytes Absolute 0.7 0.1 - 1.0 K/uL   Eosinophils Absolute 0.1 0.0 - 0.7 K/uL   Basophils Absolute 0.1 0.0 - 0.1 K/uL  Ferritin  Result Value Ref Range   Ferritin 265.7 22.0 - 322.0 ng/mL  B12 and Folate Panel  Result Value Ref Range   Vitamin B-12 176 (L) 211 - 911 pg/mL   Folate 4.0 (L) >5.9 ng/mL  Lipid panel  Result Value Ref Range   Cholesterol 202 (H) 0 - 200 mg/dL   Triglycerides 295.2 (H) 0.0 - 149.0 mg/dL   HDL 84.13 >24.40 mg/dL   VLDL 10.2 0.0 - 72.5 mg/dL   LDL Cholesterol 366 (H) 0 - 99 mg/dL   Total CHOL/HDL Ratio 4    NonHDL 146.38     Office Visit on 08/11/2023  Component Date Value   Sodium 08/11/2023 144    Potassium 08/11/2023 3.5    Chloride 08/11/2023 98    CO2 08/11/2023 34 (H)    Glucose, Bld 08/11/2023 96    BUN 08/11/2023 8    Creatinine, Ser 08/11/2023 1.02    Total Bilirubin 08/11/2023 1.0    Alkaline Phosphatase 08/11/2023 83    AST 08/11/2023 88 (H)    ALT 08/11/2023 48    Total Protein 08/11/2023 7.3    Albumin 08/11/2023 4.1    GFR 08/11/2023 81.41    Calcium 08/11/2023 9.4    Magnesium 08/11/2023 1.4 (L)    Uric Acid, Serum 08/11/2023 9.8 (H)    WBC 08/11/2023 5.6    RBC 08/11/2023 5.81    Hemoglobin 08/11/2023 15.4    HCT 08/11/2023 49.3    MCV 08/11/2023 84.8    MCHC 08/11/2023 31.2    RDW 08/11/2023 15.7 (H)    Platelets 08/11/2023 250.0    Neutrophils Relative % 08/11/2023 56.4    Lymphocytes Relative 08/11/2023 28.5    Monocytes Relative  08/11/2023 12.0    Eosinophils Relative 08/11/2023 1.5    Basophils Relative 08/11/2023 1.6    Neutro Abs 08/11/2023 3.2    Lymphs Abs 08/11/2023 1.6    Monocytes Absolute 08/11/2023 0.7    Eosinophils Absolute 08/11/2023 0.1    Basophils Absolute 08/11/2023 0.1    Ferritin 08/11/2023 265.7    Vitamin B-12 08/11/2023 176 (L)    Folate 08/11/2023 4.0 (L)    Cholesterol 08/11/2023 202 (H)    Triglycerides 08/11/2023 183.0 (H)    HDL 08/11/2023 55.20    VLDL 08/11/2023 36.6    LDL Cholesterol 08/11/2023 110 (H)    Total CHOL/HDL Ratio 08/11/2023 4    NonHDL 08/11/2023 146.38   Office Visit on 07/09/2023  Component Date Value   Uric Acid, Serum 07/09/2023 10.9 (H)    Hgb A1c MFr Bld  07/09/2023 6.1    Cholesterol 07/09/2023 220 (H)    Triglycerides 07/09/2023 281.0 (H)    HDL 07/09/2023 51.20    VLDL 07/09/2023 56.2 (H)    Total CHOL/HDL Ratio 07/09/2023 4    NonHDL 07/09/2023 169.27    TSH 07/09/2023 4.870 (H)    WBC 07/09/2023 5.6    RBC 07/09/2023 6.01 (H)    Hemoglobin 07/09/2023 15.8    HCT 07/09/2023 50.7    MCV 07/09/2023 84.3    MCHC 07/09/2023 31.2    RDW 07/09/2023 15.9 (H)    Platelets 07/09/2023 239.0    Neutrophils Relative % 07/09/2023 55.3    Lymphocytes Relative 07/09/2023 32.7    Monocytes Relative 07/09/2023 8.8    Eosinophils Relative 07/09/2023 1.5    Basophils Relative 07/09/2023 1.7    Neutro Abs 07/09/2023 3.1    Lymphs Abs 07/09/2023 1.8    Monocytes Absolute 07/09/2023 0.5    Eosinophils Absolute 07/09/2023 0.1    Basophils Absolute 07/09/2023 0.1    Sodium 07/09/2023 138    Potassium 07/09/2023 3.4 (L)    Chloride 07/09/2023 97    CO2 07/09/2023 27    Glucose, Bld 07/09/2023 95    BUN 07/09/2023 11    Creatinine, Ser 07/09/2023 1.35    Total Bilirubin 07/09/2023 1.2    Alkaline Phosphatase 07/09/2023 82    AST 07/09/2023 63 (H)    ALT 07/09/2023 46    Total Protein 07/09/2023 7.5    Albumin 07/09/2023 4.4    GFR 07/09/2023 58.20 (L)     Calcium 07/09/2023 9.4    T4,Free (Direct) 07/09/2023 1.14    Direct LDL 07/09/2023 144.0   Abstract on 05/29/2023  Component Date Value   [facility] Diabetic Eye Exam 04/16/2023 No Retinopathy   Lab on 04/23/2023  Component Date Value   Microalb, Ur 04/23/2023 2.6 (H)    Creatinine,U 04/23/2023 425.0    Microalb Creat Ratio 04/23/2023 0.6    Hgb F 04/23/2023 0.0    Hgb A 04/23/2023 98.0    Hgb A2 04/23/2023 2.0    Hgb S 04/23/2023 0.0    Interpretation, Hgb Fract 04/23/2023 Comment    Ferritin 04/23/2023 270    WBC 04/23/2023 5.6    RBC 04/23/2023 6.72 (H)    Hemoglobin 04/23/2023 15.8    Hematocrit 04/23/2023 50.5    MCV 04/23/2023 75 (L)    MCH 04/23/2023 23.5 (L)    MCHC 04/23/2023 31.3 (L)    RDW 04/23/2023 23.5 (H)    Platelets 04/23/2023 211    Alpha Thal Reflex 04/23/2023 Comment    Free T4 04/23/2023 0.88    TSH 04/23/2023 3.25    Testosterone 04/23/2023 332.31    Alpha-Thalassemia 04/23/2023 Comment:   Hospital Outpatient Visit on 03/12/2023  Component Date Value   Sodium 03/12/2023 135    Potassium 03/12/2023 4.1    Chloride 03/12/2023 100    CO2 03/12/2023 21 (L)    Glucose, Bld 03/12/2023 103 (H)    BUN 03/12/2023 13    Creatinine, Ser 03/12/2023 1.20    Calcium 03/12/2023 9.6    GFR, Estimated 03/12/2023 >60    Anion gap 03/12/2023 14   Office Visit on 03/07/2023  Component Date Value   Microalb, Ur 03/07/2023 <0.7    Creatinine,U 03/07/2023 15.1    Microalb Creat Ratio 03/07/2023 4.6    COLOGUARD 04/23/2023 Positive (A)   Hospital Outpatient Visit on 03/03/2023  Component Date Value   WBC 03/03/2023 5.6    RBC 03/03/2023  7.01 (H)    Hemoglobin 03/03/2023 15.4    HCT 03/03/2023 46.8    MCV 03/03/2023 66.8 (L)    MCH 03/03/2023 22.0 (L)    MCHC 03/03/2023 32.9    RDW 03/03/2023 22.4 (H)    Platelets 03/03/2023 225    nRBC 03/03/2023 0.0    B Natriuretic Peptide 03/03/2023 98.1    Sodium 03/03/2023 138    Potassium 03/03/2023 3.6    Chloride  03/03/2023 102    CO2 03/03/2023 23    Glucose, Bld 03/03/2023 108 (H)    BUN 03/03/2023 11    Creatinine, Ser 03/03/2023 0.97    Calcium 03/03/2023 9.0    GFR, Estimated 03/03/2023 >60    Anion gap 03/03/2023 13   Hospital Outpatient Visit on 02/14/2023  Component Date Value   Sodium 02/14/2023 139    Potassium 02/14/2023 3.3 (L)    Chloride 02/14/2023 100    CO2 02/14/2023 29    Glucose, Bld 02/14/2023 113 (H)    BUN 02/14/2023 7    Creatinine, Ser 02/14/2023 0.83    Calcium 02/14/2023 8.7 (L)    Total Protein 02/14/2023 7.2    Albumin 02/14/2023 3.5    AST 02/14/2023 31    ALT 02/14/2023 15    Alkaline Phosphatase 02/14/2023 54    Total Bilirubin 02/14/2023 1.1    GFR, Estimated 02/14/2023 >60    Anion gap 02/14/2023 10    WBC 02/14/2023 5.0    RBC 02/14/2023 5.69    Hemoglobin 02/14/2023 12.2 (L)    HCT 02/14/2023 38.3 (L)    MCV 02/14/2023 67.3 (L)    MCH 02/14/2023 21.4 (L)    MCHC 02/14/2023 31.9    RDW 02/14/2023 20.5 (H)    Platelets 02/14/2023 199    nRBC 02/14/2023 0.0    B Natriuretic Peptide 02/14/2023 248.5 (H)    TSH 02/14/2023 3.423    Lactic Acid, Venous 02/14/2023 1.6   Admission on 01/23/2023, Discharged on 01/25/2023  Component Date Value   WBC 01/23/2023 6.5    RBC 01/23/2023 5.48    Hemoglobin 01/23/2023 11.8 (L)    HCT 01/23/2023 37.2 (L)    MCV 01/23/2023 67.9 (L)    MCH 01/23/2023 21.5 (L)    MCHC 01/23/2023 31.7    RDW 01/23/2023 19.7 (H)    Platelets 01/23/2023 228    nRBC 01/23/2023 0.0    Troponin I (High Sensiti* 01/23/2023 35 (H)    Magnesium 01/23/2023 1.7    B Natriuretic Peptide 01/23/2023 377.4 (H)    Sodium 01/23/2023 137    Potassium 01/23/2023 3.6    Chloride 01/23/2023 101    CO2 01/23/2023 21 (L)    Glucose, Bld 01/23/2023 102 (H)    BUN 01/23/2023 20    Creatinine, Ser 01/23/2023 1.41 (H)    Calcium 01/23/2023 8.8 (L)    Total Protein 01/23/2023 7.4    Albumin 01/23/2023 3.6    AST 01/23/2023 24    ALT  01/23/2023 12    Alkaline Phosphatase 01/23/2023 60    Total Bilirubin 01/23/2023 1.2    GFR, Estimated 01/23/2023 58 (L)    Anion gap 01/23/2023 15    Troponin I (High Sensiti* 01/23/2023 31 (H)    D-Dimer, Quant 01/23/2023 10.31 (H)    WBC 01/23/2023 7.0    RBC 01/23/2023 5.64    Hemoglobin 01/23/2023 12.4 (L)    HCT 01/23/2023 38.0 (L)    MCV 01/23/2023 67.4 (L)    MCH 01/23/2023 22.0 (L)  MCHC 01/23/2023 32.6    RDW 01/23/2023 20.1 (H)    Platelets 01/23/2023 231    nRBC 01/23/2023 0.0    Creatinine, Ser 01/23/2023 1.39 (H)    GFR, Estimated 01/23/2023 59 (L)    Sodium 01/24/2023 137    Potassium 01/24/2023 3.4 (L)    Chloride 01/24/2023 99    CO2 01/24/2023 26    Glucose, Bld 01/24/2023 110 (H)    BUN 01/24/2023 21 (H)    Creatinine, Ser 01/24/2023 1.32 (H)    Calcium 01/24/2023 8.8 (L)    GFR, Estimated 01/24/2023 >60    Anion gap 01/24/2023 12    Magnesium 01/24/2023 1.7    WBC 01/24/2023 6.0    RBC 01/24/2023 5.63    Hemoglobin 01/24/2023 12.2 (L)    HCT 01/24/2023 38.1 (L)    MCV 01/24/2023 67.7 (L)    MCH 01/24/2023 21.7 (L)    MCHC 01/24/2023 32.0    RDW 01/24/2023 19.8 (H)    Platelets 01/24/2023 215    nRBC 01/24/2023 0.0    Neutrophils Relative % 01/24/2023 58    Neutro Abs 01/24/2023 3.5    Lymphocytes Relative 01/24/2023 30    Lymphs Abs 01/24/2023 1.8    Monocytes Relative 01/24/2023 4    Monocytes Absolute 01/24/2023 0.2    Eosinophils Relative 01/24/2023 4    Eosinophils Absolute 01/24/2023 0.2    Basophils Relative 01/24/2023 4    Basophils Absolute 01/24/2023 0.2 (H)    nRBC 01/24/2023 0    Abs Immature Granulocytes 01/24/2023 0.00    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    Weight 01/24/2023 4,761.94    Height 01/24/2023 71    BP 01/24/2023 121/80    S' Lateral 01/24/2023 3.40    Est EF 01/24/2023 55 - 60%    HIV Screen 4th Generatio* 01/24/2023 Non Reactive    TSH 01/24/2023 3.562    Hgb A1c MFr Bld 01/25/2023 6.1 (H)    Mean Plasma  Glucose 01/25/2023 128.37    Sodium 01/25/2023 135    Potassium 01/25/2023 3.8    Chloride 01/25/2023 100    CO2 01/25/2023 23    Glucose, Bld 01/25/2023 173 (H)    BUN 01/25/2023 23 (H)    Creatinine, Ser 01/25/2023 1.23    Calcium 01/25/2023 9.0    GFR, Estimated 01/25/2023 >60    Anion gap 01/25/2023 12   Admission on 10/02/2022, Discharged on 10/02/2022  Component Date Value   WBC 10/02/2022 6.8    RBC 10/02/2022 5.78    Hemoglobin 10/02/2022 13.2    Hematocrit 10/02/2022 42.2    MCV 10/02/2022 73 (L)    MCH 10/02/2022 22.8 (L)    MCHC 10/02/2022 31.3 (L)    RDW 10/02/2022 20.1 (H)    Platelets 10/02/2022 228    Neutrophils 10/02/2022 48    Lymphs 10/02/2022 34    Monocytes 10/02/2022 12    Eos 10/02/2022 3    Basos 10/02/2022 2    Neutrophils Absolute 10/02/2022 3.3    Lymphocytes Absolute 10/02/2022 2.3    Monocytes Absolute 10/02/2022 0.8    EOS (ABSOLUTE) 10/02/2022 0.2    Basophils Absolute 10/02/2022 0.1    Immature Granulocytes 10/02/2022 1    Immature Grans (Abs) 10/02/2022 0.1    Glucose 10/02/2022 86    BUN 10/02/2022 9    Creatinine, Ser 10/02/2022 0.96    eGFR 10/02/2022 93    BUN/Creatinine Ratio 10/02/2022 9    Sodium 10/02/2022 141    Potassium 10/02/2022 4.6  Chloride 10/02/2022 103    CO2 10/02/2022 23    Calcium 10/02/2022 9.3    Total Protein 10/02/2022 6.8    Albumin 10/02/2022 4.2    Globulin, Total 10/02/2022 2.6    Albumin/Globulin Ratio 10/02/2022 1.6    Bilirubin Total 10/02/2022 0.9    Alkaline Phosphatase 10/02/2022 61    AST 10/02/2022 27    ALT 10/02/2022 16   There may be more visits with results that are not included.   No image results found.   DG Hand 2 View Right  Result Date: 06/13/2023 CLINICAL DATA:  Right hand pain for 1 month.  No known injury EXAM: RIGHT HAND - 2 VIEW COMPARISON:  None Available. FINDINGS: There is no evidence of fracture or dislocation. Mild-to-moderate osteoarthritic changes of the right hand,  most pronounced at the first MCP joint and thumb IP joint. No erosion. No abnormal soft tissue mineralization. Mild generalized soft tissue swelling. 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 No acute findings. Electronically Signed   By: Duanne Guess D.O.   On: 06/13/2023 11:43    DG Hand 2 View Right  Result Date: 06/13/2023 CLINICAL DATA:  Right hand pain for 1 month.  No known injury EXAM: RIGHT HAND - 2 VIEW COMPARISON:  None Available. FINDINGS: There is no evidence of fracture or dislocation. 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 No erosion. No abnormal soft tissue mineralization. Mild generalized soft tissue swelling. 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 No acute findings. Electronically Signed   By: Duanne Guess D.O.   On: 06/13/2023 11:43       Additional Info: This encounter employed real-time, collaborative documentation. The patient actively reviewed and updated their medical record on a shared screen, ensuring transparency and facilitating joint problem-solving for the problem list, overview, and plan. This approach promotes accurate, informed care. The treatment plan was discussed and reviewed in detail, including medication safety, potential side effects, and all patient questions. We confirmed understanding and comfort with the plan. Follow-up instructions were established, including contacting the office for any concerns, returning if symptoms worsen, persist, or new symptoms develop, and precautions for potential emergency department visits.

## 2023-08-12 NOTE — Assessment & Plan Note (Signed)
His mood has been stable managing without prescribed medication. We will discontinue antidepressant medication.

## 2023-08-12 NOTE — Assessment & Plan Note (Signed)
He is on Torsemide and Spironolactone for fluid retention. We discussed increasing Spironolactone and discontinuing Torsemide to better manage fluid retention and maintain potassium levels. We will increase Spironolactone, discontinue Torsemide, check potassium levels today, and recheck in one month.  Reason for this change is desire to reduce medications and he has been having to take potassium supplement to cope with torsemide.

## 2023-08-12 NOTE — Assessment & Plan Note (Signed)
He is on a cholesterol pill, with a future possibility of switching to Repatha injections discussed. We will consider discussing the switch to Repatha injections at the next visit. Goal < 55 due to chf

## 2023-09-03 ENCOUNTER — Other Ambulatory Visit: Payer: Self-pay

## 2023-09-03 DIAGNOSIS — R748 Abnormal levels of other serum enzymes: Secondary | ICD-10-CM

## 2023-09-10 ENCOUNTER — Ambulatory Visit: Payer: PPO | Admitting: Internal Medicine

## 2023-09-10 ENCOUNTER — Telehealth: Payer: Self-pay | Admitting: Internal Medicine

## 2023-09-10 NOTE — Telephone Encounter (Signed)
LVM informing pt of no show status from today's visit. I offered pt to call back for reschedule.

## 2023-10-15 ENCOUNTER — Ambulatory Visit: Payer: PPO | Admitting: Internal Medicine

## 2023-10-22 ENCOUNTER — Telehealth: Payer: Self-pay | Admitting: Internal Medicine

## 2023-10-22 NOTE — Telephone Encounter (Signed)
Called pt to schedule [facility] visit prior to 01/03/24. VM full, sent mychart message.

## 2023-10-24 ENCOUNTER — Encounter: Payer: Self-pay | Admitting: Internal Medicine

## 2023-10-24 ENCOUNTER — Ambulatory Visit (INDEPENDENT_AMBULATORY_CARE_PROVIDER_SITE_OTHER): Payer: PPO | Admitting: Internal Medicine

## 2023-10-24 VITALS — BP 123/81 | HR 78 | Temp 97.5°F | Ht 71.0 in | Wt 260.4 lb

## 2023-10-24 DIAGNOSIS — E569 Vitamin deficiency, unspecified: Secondary | ICD-10-CM | POA: Diagnosis not present

## 2023-10-24 DIAGNOSIS — R195 Other fecal abnormalities: Secondary | ICD-10-CM | POA: Insufficient documentation

## 2023-10-24 DIAGNOSIS — Z789 Other specified health status: Secondary | ICD-10-CM

## 2023-10-24 DIAGNOSIS — E1142 Type 2 diabetes mellitus with diabetic polyneuropathy: Secondary | ICD-10-CM | POA: Insufficient documentation

## 2023-10-24 DIAGNOSIS — I5032 Chronic diastolic (congestive) heart failure: Secondary | ICD-10-CM

## 2023-10-24 DIAGNOSIS — K760 Fatty (change of) liver, not elsewhere classified: Secondary | ICD-10-CM

## 2023-10-24 DIAGNOSIS — M1A0320 Idiopathic chronic gout, left wrist, without tophus (tophi): Secondary | ICD-10-CM

## 2023-10-24 DIAGNOSIS — E538 Deficiency of other specified B group vitamins: Secondary | ICD-10-CM

## 2023-10-24 DIAGNOSIS — E785 Hyperlipidemia, unspecified: Secondary | ICD-10-CM

## 2023-10-24 DIAGNOSIS — F32A Depression, unspecified: Secondary | ICD-10-CM

## 2023-10-24 MED ORDER — ALLOPURINOL 300 MG PO TABS
300.0000 mg | ORAL_TABLET | Freq: Every day | ORAL | 3 refills | Status: DC
Start: 2023-10-24 — End: 2023-12-15

## 2023-10-24 MED ORDER — FOLIC ACID 1 MG PO TABS: 1.0000 mg | ORAL_TABLET | Freq: Every day | ORAL | 3 refills | Status: AC | PRN

## 2023-10-24 MED ORDER — ROSUVASTATIN CALCIUM 20 MG PO TABS: 20.0000 mg | ORAL_TABLET | Freq: Every day | ORAL | 3 refills | Status: DC

## 2023-10-24 MED ORDER — B-12 1000 MCG SL SUBL
1.0000 | SUBLINGUAL_TABLET | Freq: Every day | SUBLINGUAL | 3 refills | Status: AC
Start: 2023-10-24 — End: ?

## 2023-10-24 NOTE — Progress Notes (Signed)
[facility]: [phone]   -- Medical Office Visit --  Patient:  Parker [name]      Age: 57 y.o.       Sex:  male  Date:   10/24/2023 Today's Healthcare Provider: Lula Olszewski, MD  ==========================================================================     Assessment & Plan NAFLD (nonalcoholic fatty liver disease) Nonalcoholic Fatty Liver Disease (QMV-78: K76.0)  AST elevated at 88, ALT 48, worsened from previous values Continue weight loss efforts, noting success with current regimen Ultrasound ordered through [facility] Resume Ozempic as tolerated from existing supply Maintain alcohol abstinence, reinforced by naltrexone response Monitor LFTs in 3 months Diabetic polyneuropathy associated with type 2 diabetes mellitus (HCC)  Chronic gout of left wrist, unspecified cause Gout (ICD-10: M10.9)  Uric acid elevated at 9.8 Continue allopurinol as tolerated Recommended cherry juice supplementation Monitor uric acid with next labs Vitamin deficiency  Positive colorectal cancer screening using Cologuard test  Alcohol use Notable improvement with naltrexone Continue naltrexone as needed when anticipating triggers Encouraged non-pharmacological coping strategies including deep breathing exercises Provided progressive muscle relaxation instruction Depression, unspecified depression type Depression (ICD-10: F32.0)  PHQ-9 score 6, improved from previous 10 Experiencing social isolation post children's departure Discussed exercise continuation and social engagement Consider Wellbutrin trial at next visit Encouraged continuation of business development activities as positive focus Hyperlipidemia, unspecified hyperlipidemia type Hyperlipidemia (ICD-10: E78.2)  LDL improved to 110 from 144 Continue Crestor with modified schedule of 2-3 times weekly if daily adherence difficult Reassess lipids in 3 months Chronic heart failure with  preserved ejection fraction (HCC) Lower Extremity Edema  Recent exacerbation noted Increase torsemide to twice daily until resolution Continue spironolactone Maintain potassium supplementation with increased diuretic use Sodium restriction reviewed Folate deficiency Vitamin Deficiencies  B12 and folate deficiency confirmed New prescriptions provided for both Recheck levels in 3 months B12 deficiency Vitamin Deficiencies  B12 and folate deficiency confirmed New prescriptions provided for both Recheck levels in 3 months      Orders Placed During this Encounter:  Diagnoses and all orders for this visit: NAFLD (nonalcoholic fatty liver disease) -     US ABDOMEN RUQ W/ELASTOGRAPHY; Future Diabetic polyneuropathy associated with type 2 diabetes mellitus (HCC) -     rosuvastatin (CRESTOR) 20 MG tablet; Take 1 tablet (20 mg total) by mouth daily. Replaces 10 mg dose, can cause cramps Chronic gout of left wrist, unspecified cause -     allopurinol (ZYLOPRIM) 300 MG tablet; Take 1 tablet (300 mg total) by mouth daily. Vitamin deficiency -     Cyanocobalamin (B-12) 1000 MCG SUBL; Place 1 tablet under the tongue daily at 6 (six) AM. -     folic acid (FOLVITE) 1 MG tablet; Take 1 tablet (1 mg total) by mouth daily as needed (low iron). Positive colorectal cancer screening using Cologuard test -     Ambulatory referral to Gastroenterology Alcohol use Depression, unspecified depression type Hyperlipidemia, unspecified hyperlipidemia type Chronic heart failure with preserved ejection fraction (HCC) Folate deficiency B12 deficiency  Preventive Care  Colonoscopy referral to be processed this week due to positive Cologuard Scheduling for Monday to accommodate son's availability Due for ophthalmology follow-up for diabetes management  Follow-up  Return January 07, 2024 Bring all medication bottles for comprehensive medication review Labs prior to visit  SUBJECTIVE: 57 y.o. male who  has Plantar fasciitis of right foot; Deformity of metatarsal; Acquired equinus deformity of foot; Chronic heart failure with preserved ejection fraction (HCC); Controlled type 2 diabetes mellitus without complication,  without long-term current use of insulin (HCC); Unspecified atrial fibrillation (HCC); HTN (hypertension); Hypothyroidism; Gout; Obesity, Class III, BMI 40-49.9 (morbid obesity) (HCC); OSA (obstructive sleep apnea); IDA (iron deficiency anemia); Vertigo; High risk for readmission; Hyperlipidemia; Erectile dysfunction; History of stroke; hypokalemia, drug induced; Alcohol use; Cervical spondylosis; Chronic neck pain; Depression; Diverticulosis of large intestine without hemorrhage; Edema of both lower extremities; Erectile dysfunction due to arterial insufficiency; Idiopathic chronic gout 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; Large liver; H/O medication noncompliance; LVH (left ventricular hypertrophy); Lymphedema; Mitral valve regurgitation; Prediabetes; Severe obesity (BMI 35.0-39.9) with comorbidity (HCC); Transaminitis; Tubular adenoma of colon; Arthralgia of left ankle; Left knee pain; Hypomagnesemia; Positive cardiac stress test; Primary localized osteoarthritis of left knee; Tricuspid valve regurgitation; Alpha thalassaemia minor; Swelling of hand; Pain in right hand; Pain involving joints of fingers of both hands; NAFLD (nonalcoholic fatty liver disease); B12 deficiency; Folate deficiency; and Chronic kidney disease, stage 2 (mild) on their problem list.  Main reasons for visit/main concerns/chief complaint: 3 month follow-up   History of Present Illness:  Patient presents for routine follow-up with multiple chronic conditions. Reports successful lifestyle modifications with improved blood pressure control, documented today at 123/81. Current weight 255-256 pounds, representing significant weight loss through diet and exercise.  Medication management has been challenging due to polypharmacy  burden. Takes medication breaks from Crestor due to feeling overwhelmed with daily pill regimen. Reports positive response to naltrexone with decreased alcohol interest, supporting NAFLD management. Notes recurrence of lower extremity edema, managed by increasing torsemide to twice daily with concurrent potassium supplementation.  Reports dissatisfaction with benzodiazepines for depression management, preferring non-pharmacological approaches including meditation and deep breathing exercises. Describes experiencing increased isolation and loneliness since children moved away, impacting mood. Recently celebrated birthday with sons, providing temporary relief from social isolation.  Positive Cologuard test requires follow-up colonoscopy scheduling, complicated by need for accompaniment post-procedure. Last colonoscopy 2015 revealed tubular adenoma. Son potentially available Mondays for procedure accompaniment.  Recent lab work reveals multiple deficiencies including B12, folate, and vitamin D. Thyroid function borderline. September labs show improved kidney function and cholesterol levels, though still not at target. LDL 110, down from 144. Liver enzymes remain elevated with AST 88, ALT 48.  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 Engages in regular physical activity and dietary modifications, demonstrating commitment to lifestyle changes despite social challenges.   Note that patient  has a past medical history of (HFpEF) heart failure with preserved ejection fraction (HCC), Acute on chronic heart failure (HCC) (05/16/2022), AKI (acute kidney injury) (HCC) (01/23/2023), Arthritis, Cataract, CHF (congestive heart failure) (HCC), Diabetes mellitus without complication (HCC), Diastolic CHF (HCC) (02/14/2023), Dyspnea, Dysrhythmia, GERD (gastroesophageal reflux disease), History of COVID-19 (05/18/2021), Hypertension,  Kidney injury (03/07/2023), Localized swelling of left lower extremity (01/11/2020), Localized swelling of right lower extremity (04/24/2020), Muscle spasm, OSA on CPAP (03/07/2023), Peripheral edema, Plantar fasciitis, Situational insomnia (01/06/2018), Sleep apnea, and Sprain of deltoid ligament of left ankle (08/10/2019).  Problem list overviews that were updated at today's visit:No problems updated.  Med reconciliation: Current Outpatient Medications on File Prior to Visit  Medication Sig   albuterol (VENTOLIN HFA) 108 (90 Base) MCG/ACT inhaler Inhale 2 puffs into the lungs every 6 (six) hours as needed for shortness of breath.   augmented betamethasone dipropionate (DIPROLENE-AF) 0.05 % cream Apply topically 2 (two) times daily as needed.   buPROPion ER (WELLBUTRIN SR) 100 MG 12 hr tablet Take 1 tablet (100  mg total) by mouth in the morning.   Clotrimazole 1 % OINT Apply 1 Application topically 2 (two) times a week.   colchicine 0.6 MG tablet Take 1 tablet (0.6 mg total) by mouth daily as needed (gout flares).   empagliflozin (JARDIANCE) 10 MG TABS tablet Take 1 tablet (10 mg total) by mouth daily.   erythromycin ophthalmic ointment 3 (three) times daily.   fluticasone (FLONASE) 50 MCG/ACT nasal spray Place 2 sprays into both nostrils daily. (Patient taking differently: Place 2 sprays into both nostrils daily as needed for allergies.)   levofloxacin (LEVAQUIN) 500 MG tablet TAKE 1 TABLET BY MOUTH EVERY DAY FOR 10 DAYS   levothyroxine (SYNTHROID) 50 MCG tablet Take 1 tablet (50 mcg total) by mouth daily. Needs to be adjusted every 3-6 months based on labwork if still taking amiodarone   losartan (COZAAR) 50 MG tablet Take 1 tablet by mouth daily.   meclizine (ANTIVERT) 25 MG tablet Take 25 mg by mouth 3 (three) times daily as needed for dizziness.   metoprolol succinate (TOPROL-XL) 50 MG 24 hr tablet Take 1 tablet (50 mg total) by mouth daily.   Multiple Vitamins-Minerals (MULTIVITAMIN WITH  MINERALS) tablet Take 1 tablet by mouth daily.   naltrexone (DEPADE) 50 MG tablet Take 0.5 tablets (25 mg total) by mouth daily.   potassium chloride (KLOR-CON) 10 MEQ tablet Take 1 tablet (10 mEq total) by mouth daily. Take with torsemide because torsemide lowers potassium   prednisoLONE acetate (PRED FORTE) 1 % ophthalmic suspension Place into the right eye.   predniSONE (STERAPRED UNI-PAK 48 TAB) 10 MG (48) TBPK tablet See admin instructions. see package   rivaroxaban (XARELTO) 20 MG TABS tablet Take 1 tablet (20 mg total) by mouth daily with supper.   sacubitril-valsartan (ENTRESTO) 49-51 MG Take 1 tablet by mouth 2 (two) times daily.   Semaglutide,0.25 or 0.5MG /DOS, (OZEMPIC, 0.25 OR 0.5 MG/DOSE,) 2 MG/3ML SOPN Inject 0.5 mg into the skin once a week.   spironolactone (ALDACTONE) 25 MG tablet Take 1.5 tablets (37.5 mg total) by mouth daily. Increased dose, to allow to stop torsemide   torsemide (DEMADEX) 20 MG tablet Take 20 mg by mouth daily.   traMADol (ULTRAM) 50 MG tablet Take 1 tablet by mouth every 8 (eight) hours as needed.   traZODone (DESYREL) 50 MG tablet Take 25 mg by mouth at bedtime as needed.   amiodarone (PACERONE) 200 MG tablet Take 2 tablets (400 mg total) by mouth 2 (two) times daily for 7 days, THEN 1 tablet (200 mg total) 2 (two) times daily for 14 days, THEN 1 tablet (200 mg total) daily.   No current facility-administered medications on file prior to visit.   Medications Discontinued During This Encounter  Medication Reason   gabapentin (NEURONTIN) 300 MG capsule Completed Course   folic acid (FOLVITE) 1 MG tablet Reorder   allopurinol (ZYLOPRIM) 300 MG tablet Reorder   rosuvastatin (CRESTOR) 20 MG tablet Reorder     Objective   Physical Exam     10/24/2023    1:19 PM 08/11/2023    1:30 PM 08/11/2023    1:22 PM  Vitals with BMI  Height 5\' 11"   5\' 11"   Weight 260 lbs 6 oz  265 lbs 6 oz  BMI 36.33  37.03  Systolic 123 128 409  Diastolic 81 74 98  Pulse 78  65    Wt Readings from Last 10 Encounters:  10/24/23 260 lb 6.4 oz (118.1 kg)  08/11/23 265 lb 6.4  oz (120.4 kg)  07/09/23 258 lb 6.4 oz (117.2 kg)  06/13/23 260 lb (117.9 kg)  05/29/23 262 lb 12.8 oz (119.2 kg)  04/07/23 270 lb 3.2 oz (122.6 kg)  03/07/23 280 lb 6.4 oz (127.2 kg)  03/03/23 279 lb 6.4 oz (126.7 kg)  02/14/23 296 lb 9.6 oz (134.5 kg)  01/25/23 295 lb 3.2 oz (133.9 kg)   Vital signs reviewed.  Nursing notes reviewed. Weight trend reviewed. Abnormalities and Problem-Specific physical exam findings:  leg swelling, truncal adiposity,  General Appearance:  No acute distress appreciable.   Well-groomed, healthy-appearing male.  Well proportioned with no abnormal fat distribution.  Good muscle tone. Pulmonary:  Normal work of breathing at rest, no respiratory distress apparent. SpO2: 97 %  Musculoskeletal: All extremities are intact.  Neurological:  Awake, alert, oriented, and engaged.  No obvious focal neurological deficits or cognitive impairments.  Sensorium seems unclouded.   Speech is clear and coherent with logical content. Psychiatric:  Appropriate mood, pleasant and cooperative demeanor, thoughtful and engaged during the exam  Results   LABS LDL: 110 (08/2023) Vitamin B12: low Folate: low Vitamin D: low Thyroid function: borderline  DIAGNOSTIC Cologuard: positive        No results found for any visits on 10/24/23.  Office Visit on 08/11/2023  Component Date Value   Sodium 08/11/2023 144    Potassium 08/11/2023 3.5    Chloride 08/11/2023 98    CO2 08/11/2023 34 (H)    Glucose, Bld 08/11/2023 96    BUN 08/11/2023 8    Creatinine, Ser 08/11/2023 1.02    Total Bilirubin 08/11/2023 1.0    Alkaline Phosphatase 08/11/2023 83    AST 08/11/2023 88 (H)    ALT 08/11/2023 48    Total Protein 08/11/2023 7.3    Albumin 08/11/2023 4.1    GFR 08/11/2023 81.41    Calcium 08/11/2023 9.4    Magnesium 08/11/2023 1.4 (L)    Uric Acid, Serum 08/11/2023 9.8 (H)    WBC  08/11/2023 5.6    RBC 08/11/2023 5.81    Hemoglobin 08/11/2023 15.4    HCT 08/11/2023 49.3    MCV 08/11/2023 84.8    MCHC 08/11/2023 31.2    RDW 08/11/2023 15.7 (H)    Platelets 08/11/2023 250.0    Neutrophils Relative % 08/11/2023 56.4    Lymphocytes Relative 08/11/2023 28.5    Monocytes Relative 08/11/2023 12.0    Eosinophils Relative 08/11/2023 1.5    Basophils Relative 08/11/2023 1.6    Neutro Abs 08/11/2023 3.2    Lymphs Abs 08/11/2023 1.6    Monocytes Absolute 08/11/2023 0.7    Eosinophils Absolute 08/11/2023 0.1    Basophils Absolute 08/11/2023 0.1    Ferritin 08/11/2023 265.7    Vitamin B-12 08/11/2023 176 (L)    Folate 08/11/2023 4.0 (L)    Cholesterol 08/11/2023 202 (H)    Triglycerides 08/11/2023 183.0 (H)    HDL 08/11/2023 55.20    VLDL 08/11/2023 36.6    LDL Cholesterol 08/11/2023 110 (H)    Total CHOL/HDL Ratio 08/11/2023 4    NonHDL 08/11/2023 146.38   Office Visit on 07/09/2023  Component Date Value   Uric Acid, Serum 07/09/2023 10.9 (H)    Hgb A1c MFr Bld 07/09/2023 6.1    Cholesterol 07/09/2023 220 (H)    Triglycerides 07/09/2023 281.0 (H)    HDL 07/09/2023 51.20    VLDL 07/09/2023 56.2 (H)    Total CHOL/HDL Ratio 07/09/2023 4    NonHDL 07/09/2023 169.27    TSH  07/09/2023 4.870 (H)    WBC 07/09/2023 5.6    RBC 07/09/2023 6.01 (H)    Hemoglobin 07/09/2023 15.8    HCT 07/09/2023 50.7    MCV 07/09/2023 84.3    MCHC 07/09/2023 31.2    RDW 07/09/2023 15.9 (H)    Platelets 07/09/2023 239.0    Neutrophils Relative % 07/09/2023 55.3    Lymphocytes Relative 07/09/2023 32.7    Monocytes Relative 07/09/2023 8.8    Eosinophils Relative 07/09/2023 1.5    Basophils Relative 07/09/2023 1.7    Neutro Abs 07/09/2023 3.1    Lymphs Abs 07/09/2023 1.8    Monocytes Absolute 07/09/2023 0.5    Eosinophils Absolute 07/09/2023 0.1    Basophils Absolute 07/09/2023 0.1    Sodium 07/09/2023 138    Potassium 07/09/2023 3.4 (L)    Chloride 07/09/2023 97    CO2  07/09/2023 27    Glucose, Bld 07/09/2023 95    BUN 07/09/2023 11    Creatinine, Ser 07/09/2023 1.35    Total Bilirubin 07/09/2023 1.2    Alkaline Phosphatase 07/09/2023 82    AST 07/09/2023 63 (H)    ALT 07/09/2023 46    Total Protein 07/09/2023 7.5    Albumin 07/09/2023 4.4    GFR 07/09/2023 58.20 (L)    Calcium 07/09/2023 9.4    T4,Free (Direct) 07/09/2023 1.14    Direct LDL 07/09/2023 144.0   Abstract on 05/29/2023  Component Date Value   HM Diabetic Eye Exam 04/16/2023 No Retinopathy   Lab on 04/23/2023  Component Date Value   Microalb, Ur 04/23/2023 2.6 (H)    Creatinine,U 04/23/2023 425.0    Microalb Creat Ratio 04/23/2023 0.6    Hgb F 04/23/2023 0.0    Hgb A 04/23/2023 98.0    Hgb A2 04/23/2023 2.0    Hgb S 04/23/2023 0.0    Interpretation, Hgb Fract 04/23/2023 Comment    Ferritin 04/23/2023 270    WBC 04/23/2023 5.6    RBC 04/23/2023 6.72 (H)    Hemoglobin 04/23/2023 15.8    Hematocrit 04/23/2023 50.5    MCV 04/23/2023 75 (L)    MCH 04/23/2023 23.5 (L)    MCHC 04/23/2023 31.3 (L)    RDW 04/23/2023 23.5 (H)    Platelets 04/23/2023 211    Alpha Thal Reflex 04/23/2023 Comment    Free T4 04/23/2023 0.88    TSH 04/23/2023 3.25    Testosterone 04/23/2023 332.31    Alpha-Thalassemia 04/23/2023 Comment:   Hospital Outpatient Visit on 03/12/2023  Component Date Value   Sodium 03/12/2023 135    Potassium 03/12/2023 4.1    Chloride 03/12/2023 100    CO2 03/12/2023 21 (L)    Glucose, Bld 03/12/2023 103 (H)    BUN 03/12/2023 13    Creatinine, Ser 03/12/2023 1.20    Calcium 03/12/2023 9.6    GFR, Estimated 03/12/2023 >60    Anion gap 03/12/2023 14   Office Visit on 03/07/2023  Component Date Value   Microalb, Ur 03/07/2023 <0.7    Creatinine,U 03/07/2023 15.1    Microalb Creat Ratio 03/07/2023 4.6    COLOGUARD 04/23/2023 Positive (A)   Hospital Outpatient Visit on 03/03/2023  Component Date Value   WBC 03/03/2023 5.6    RBC 03/03/2023 7.01 (H)    Hemoglobin  03/03/2023 15.4    HCT 03/03/2023 46.8    MCV 03/03/2023 66.8 (L)    MCH 03/03/2023 22.0 (L)    MCHC 03/03/2023 32.9    RDW 03/03/2023 22.4 (H)    Platelets 03/03/2023  225    nRBC 03/03/2023 0.0    B Natriuretic Peptide 03/03/2023 98.1    Sodium 03/03/2023 138    Potassium 03/03/2023 3.6    Chloride 03/03/2023 102    CO2 03/03/2023 23    Glucose, Bld 03/03/2023 108 (H)    BUN 03/03/2023 11    Creatinine, Ser 03/03/2023 0.97    Calcium 03/03/2023 9.0    GFR, Estimated 03/03/2023 >60    Anion gap 03/03/2023 13   Hospital Outpatient Visit on 02/14/2023  Component Date Value   Sodium 02/14/2023 139    Potassium 02/14/2023 3.3 (L)    Chloride 02/14/2023 100    CO2 02/14/2023 29    Glucose, Bld 02/14/2023 113 (H)    BUN 02/14/2023 7    Creatinine, Ser 02/14/2023 0.83    Calcium 02/14/2023 8.7 (L)    Total Protein 02/14/2023 7.2    Albumin 02/14/2023 3.5    AST 02/14/2023 31    ALT 02/14/2023 15    Alkaline Phosphatase 02/14/2023 54    Total Bilirubin 02/14/2023 1.1    GFR, Estimated 02/14/2023 >60    Anion gap 02/14/2023 10    WBC 02/14/2023 5.0    RBC 02/14/2023 5.69    Hemoglobin 02/14/2023 12.2 (L)    HCT 02/14/2023 38.3 (L)    MCV 02/14/2023 67.3 (L)    MCH 02/14/2023 21.4 (L)    MCHC 02/14/2023 31.9    RDW 02/14/2023 20.5 (H)    Platelets 02/14/2023 199    nRBC 02/14/2023 0.0    B Natriuretic Peptide 02/14/2023 248.5 (H)    TSH 02/14/2023 3.423    Lactic Acid, Venous 02/14/2023 1.6   Admission on 01/23/2023, Discharged on 01/25/2023  Component Date Value   WBC 01/23/2023 6.5    RBC 01/23/2023 5.48    Hemoglobin 01/23/2023 11.8 (L)    HCT 01/23/2023 37.2 (L)    MCV 01/23/2023 67.9 (L)    MCH 01/23/2023 21.5 (L)    MCHC 01/23/2023 31.7    RDW 01/23/2023 19.7 (H)    Platelets 01/23/2023 228    nRBC 01/23/2023 0.0    Troponin I (High Sensiti* 01/23/2023 35 (H)    Magnesium 01/23/2023 1.7    B Natriuretic Peptide 01/23/2023 377.4 (H)    Sodium 01/23/2023 137     Potassium 01/23/2023 3.6    Chloride 01/23/2023 101    CO2 01/23/2023 21 (L)    Glucose, Bld 01/23/2023 102 (H)    BUN 01/23/2023 20    Creatinine, Ser 01/23/2023 1.41 (H)    Calcium 01/23/2023 8.8 (L)    Total Protein 01/23/2023 7.4    Albumin 01/23/2023 3.6    AST 01/23/2023 24    ALT 01/23/2023 12    Alkaline Phosphatase 01/23/2023 60    Total Bilirubin 01/23/2023 1.2    GFR, Estimated 01/23/2023 58 (L)    Anion gap 01/23/2023 15    Troponin I (High Sensiti* 01/23/2023 31 (H)    D-Dimer, Quant 01/23/2023 10.31 (H)    WBC 01/23/2023 7.0    RBC 01/23/2023 5.64    Hemoglobin 01/23/2023 12.4 (L)    HCT 01/23/2023 38.0 (L)    MCV 01/23/2023 67.4 (L)    MCH 01/23/2023 22.0 (L)    MCHC 01/23/2023 32.6    RDW 01/23/2023 20.1 (H)    Platelets 01/23/2023 231    nRBC 01/23/2023 0.0    Creatinine, Ser 01/23/2023 1.39 (H)    GFR, Estimated 01/23/2023 59 (L)    Sodium 01/24/2023 137  Potassium 01/24/2023 3.4 (L)    Chloride 01/24/2023 99    CO2 01/24/2023 26    Glucose, Bld 01/24/2023 110 (H)    BUN 01/24/2023 21 (H)    Creatinine, Ser 01/24/2023 1.32 (H)    Calcium 01/24/2023 8.8 (L)    GFR, Estimated 01/24/2023 >60    Anion gap 01/24/2023 12    Magnesium 01/24/2023 1.7    WBC 01/24/2023 6.0    RBC 01/24/2023 5.63    Hemoglobin 01/24/2023 12.2 (L)    HCT 01/24/2023 38.1 (L)    MCV 01/24/2023 67.7 (L)    MCH 01/24/2023 21.7 (L)    MCHC 01/24/2023 32.0    RDW 01/24/2023 19.8 (H)    Platelets 01/24/2023 215    nRBC 01/24/2023 0.0    Neutrophils Relative % 01/24/2023 58    Neutro Abs 01/24/2023 3.5    Lymphocytes Relative 01/24/2023 30    Lymphs Abs 01/24/2023 1.8    Monocytes Relative 01/24/2023 4    Monocytes Absolute 01/24/2023 0.2    Eosinophils Relative 01/24/2023 4    Eosinophils Absolute 01/24/2023 0.2    Basophils Relative 01/24/2023 4    Basophils Absolute 01/24/2023 0.2 (H)    nRBC 01/24/2023 0    Abs Immature Granulocytes 01/24/2023 0.00     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    Weight 01/24/2023 4,761.94    Height 01/24/2023 71    BP 01/24/2023 121/80    S' Lateral 01/24/2023 3.40    Est EF 01/24/2023 55 - 60%    HIV Screen 4th Generatio* 01/24/2023 Non Reactive    TSH 01/24/2023 3.562    Hgb A1c MFr Bld 01/25/2023 6.1 (H)    Mean Plasma Glucose 01/25/2023 128.37    Sodium 01/25/2023 135    Potassium 01/25/2023 3.8    Chloride 01/25/2023 100    CO2 01/25/2023 23    Glucose, Bld 01/25/2023 173 (H)    BUN 01/25/2023 23 (H)    Creatinine, Ser 01/25/2023 1.23    Calcium 01/25/2023 9.0    GFR, Estimated 01/25/2023 >60    Anion gap 01/25/2023 12    No image results found.   No results found.  DG Hand 2 View Right  Result Date: 06/13/2023 CLINICAL DATA:  Right hand pain for 1 month.  No known injury EXAM: RIGHT HAND - 2 VIEW COMPARISON:  None Available. FINDINGS: There is no evidence of fracture or dislocation. 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 No erosion. No abnormal soft tissue mineralization. Mild generalized soft tissue swelling. 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 No acute findings. Electronically Signed   By: Duanne Guess D.O.   On: 06/13/2023 11:43         Additional Info: This encounter employed real-time, collaborative documentation. The patient actively reviewed and updated their medical record on a shared screen, ensuring transparency and facilitating joint problem-solving for the problem list, overview, and plan. This approach promotes accurate, informed care. The treatment plan was discussed and reviewed in detail, including medication safety, potential side effects, and all patient questions. We confirmed understanding and comfort with the plan. Follow-up instructions were established, including contacting the office for any concerns, returning if symptoms worsen, persist, or new symptoms  develop, and precautions for potential emergency department visits.

## 2023-10-24 NOTE — Assessment & Plan Note (Signed)
Gout (ICD-10: M10.9)  Uric acid elevated at 9.8 Continue allopurinol as tolerated Recommended cherry juice supplementation Monitor uric acid with next labs

## 2023-10-24 NOTE — Assessment & Plan Note (Signed)
Nonalcoholic Fatty Liver Disease (JXB-14: K76.0)  AST elevated at 88, ALT 48, worsened from previous values Continue weight loss efforts, noting success with current regimen Ultrasound ordered through [facility] Resume Ozempic as tolerated from existing supply Maintain alcohol abstinence, reinforced by naltrexone response Monitor LFTs in 3 months

## 2023-10-24 NOTE — Assessment & Plan Note (Signed)
Depression (ICD-10: F32.0)  PHQ-9 score 6, improved from previous 10 Experiencing social isolation post children's departure Discussed exercise continuation and social engagement Consider Wellbutrin trial at next visit Encouraged continuation of business development activities as positive focus

## 2023-10-24 NOTE — Assessment & Plan Note (Signed)
Notable improvement with naltrexone Continue naltrexone as needed when anticipating triggers Encouraged non-pharmacological coping strategies including deep breathing exercises Provided progressive muscle relaxation instruction

## 2023-10-24 NOTE — Assessment & Plan Note (Signed)
Vitamin Deficiencies  B12 and folate deficiency confirmed New prescriptions provided for both Recheck levels in 3 months

## 2023-10-24 NOTE — Assessment & Plan Note (Signed)
Hyperlipidemia (ICD-10: E78.2)  LDL improved to 110 from 144 Continue Crestor with modified schedule of 2-3 times weekly if daily adherence difficult Reassess lipids in 3 months

## 2023-10-24 NOTE — Assessment & Plan Note (Signed)
Lower Extremity Edema  Recent exacerbation noted Increase torsemide to twice daily until resolution Continue spironolactone Maintain potassium supplementation with increased diuretic use Sodium restriction reviewed

## 2023-10-24 NOTE — Patient Instructions (Signed)
After Visit Summary  Thank you for your visit today. Here are your instructions:  Today's Blood Pressure: 123/81 - Excellent! Current Weight: 255-256 pounds - Good progress with weight loss  Medication Changes: - Start B12 and folate supplements as prescribed - Resume Ozempic from existing supply - Increase water pill (torsemide) to twice daily until leg swelling improves - Take extra potassium when using increased water pill - Continue Crestor 2-3 times weekly if daily is too difficult  New Instructions: 1. Exercise and Diet    - Continue current exercise program    - Maintain low-salt diet    - Try cherry juice for gout management  2. Stress Management    - Practice deep breathing exercises demonstrated today    - Use progressive muscle relaxation technique:      * Tighten muscles starting with feet      * Hold for few seconds      * Release and feel tension leaving      * Move up through body parts    - Practice when calm to use effectively during stress  3. Upcoming Appointments    - Colonoscopy to be scheduled on a Monday    - Liver ultrasound at [facility] - scheduling will call    - Next office visit: January 07, 2024  Important Reminders: - Bring ALL medication bottles to next visit - Get labs before February appointment - Continue monitoring blood pressure - Keep legs elevated when resting - Stay well hydrated  Call the office if you experience: - Severe leg swelling - Shortness of breath - Chest pain - Severe joint pain - Increased depression symptoms  Remember: Your lifestyle changes are making a difference - blood pressure, weight, and several lab values have improved. Keep up the good work!

## 2023-11-14 ENCOUNTER — Telehealth: Payer: Self-pay | Admitting: Internal Medicine

## 2023-11-14 ENCOUNTER — Ambulatory Visit: Payer: PPO | Admitting: Internal Medicine

## 2023-11-14 NOTE — Telephone Encounter (Signed)
Patient called back around 3pm and spoke with Harriett Sine who transferred him back to triage. Per triage note below, patient was reached out to 3 additional times but they received no answer. Patient did call back and I spoke with him and informed him of the failed attempts to contact him. Patient expressed disappointment in not being reached out to through out the day. I apologized to patient for the series of events and got him transferred over to triage considering that this final call was received @4 :50 pm.    Patient Name First: [facility] Last: [name] Gender: Male DOB: 08-05-1966 Age: 57 Y 24 D Return Phone Number: [phone] (Primary) Address: City/ State/ Zip: [place]  [address] Client [facility] - Administrator, sports at [facility] Provider Parker [name]- MD Contact Type Call Who Is Calling Patient / Member / Family / Caregiver Call Type Triage / Clinical Relationship To Patient Self Return Phone Number [phone] (Primary) Chief Complaint Foot Pain Reason for Call Symptomatic / Request for Health Information Initial Comment Caller states the he is having throbbing pain, swelling and warm to the touch in right hand and left foot. He also is unable to sleep due to the pain. Translation No Disp. Time [name] Time) Disposition Final User 11/14/2023 3:33:09 PM Attempt made - no message left Popejoy, RN, Mardella Layman 11/14/2023 3:50:05 PM Attempt made - no message left Popejoy, RN, Mardella Layman 11/14/2023 4:01:27 PM FINAL ATTEMPT MADE - no message left Yes Popejoy, RN, Mardella Layman Final Disposition 11/14/2023 4:01:27 PM FINAL ATTEMPT MADE - no message left Yes Popejoy, RN, Mardella Layman

## 2023-11-14 NOTE — Telephone Encounter (Signed)
FYI: This call has been transferred to triage nurse: the Triage Nurse. Once the result note has been entered staff can address the message at that time.  Patient called in with the following symptoms:  Red Word: Excruciating pain, swelling and warm to the touch in right hand and left foot   Please advise at Mobile [phone] (mobile)  Message is routed to Provider Pool.

## 2023-11-14 NOTE — Telephone Encounter (Signed)
Per Triage: Patient was unable to be reached.  Three messages were left on Patient's voice mail.   Patient Name First: [facility] Last: [name] Gender: Male DOB: 05-30-1966 Age: 57 Y 24 D Return Phone Number: [phone] (Primary) Address: City/ State/ Zip: [place]  [address] Client [facility] - Administrator, sports at [facility] Provider Glenetta Hew- MD Contact Type Call Who Is Calling Patient / Member / Family / Caregiver Call Type Triage / Clinical Relationship To Patient Self Return Phone Number [phone] (Primary) Chief Complaint Hand Pain Reason for Call Symptomatic / Request for Health Information Initial Comment Caller states the pt is in pain, swelling and warm to the touch in right hand and left foot. Translation No Disp. Time [name] Time) Disposition Final User 11/14/2023 8:42:33 AM Attempt made - message left Corpus, RN, Victorino Dike 11/14/2023 9:03:34 AM Attempt made - message left Corpus, RN, Victorino Dike 11/14/2023 9:23:03 AM FINAL ATTEMPT MADE - message left Yes Corpus, RN, Victorino Dike Final Disposition 11/14/2023 9:23:03 AM FINAL ATTEMPT MADE - message left

## 2023-11-16 ENCOUNTER — Ambulatory Visit
Admission: EM | Admit: 2023-11-16 | Discharge: 2023-11-16 | Disposition: A | Discharge status: Home / Self Care | Payer: PPO | Attending: Emergency Medicine | Admitting: Emergency Medicine

## 2023-11-16 DIAGNOSIS — M109 Gout, unspecified: Secondary | ICD-10-CM

## 2023-11-16 MED ORDER — KETOROLAC TROMETHAMINE 30 MG/ML IJ SOLN: 30.0000 mg | Freq: Once | INTRAMUSCULAR | Status: DC

## 2023-11-16 MED ORDER — COLCHICINE 0.6 MG PO TABS: ORAL_TABLET | ORAL | 0 refills | Status: DC

## 2023-11-16 MED ORDER — KETOROLAC TROMETHAMINE 30 MG/ML IJ SOLN
30.0000 mg | Freq: Once | INTRAMUSCULAR | Status: AC
  Administered 2023-11-16: 30 mg via INTRAMUSCULAR

## 2023-11-16 NOTE — Discharge Instructions (Signed)
Prescribed colchicine take as directed and to completion Continue to take allopurinol take as directed Follow up with PCP for further evaluation and management Return or go to the ED if you have any new or worsening symptoms

## 2023-11-16 NOTE — ED Triage Notes (Signed)
Patient presents with left foot and right hand pain is swollen x 5 days. No injury noted. Treated with Tylenol.

## 2023-11-16 NOTE — ED Provider Notes (Signed)
[facility]   [id number] 11/16/23 Arrival Time: 2595   CC: Gout symptoms  SUBJECTIVE: History from: patient.  DARICK SAFER is a 57 y.o. male with history of GERD presents to the urgent care for complaint of right hand pain and swelling and left foot pain for the past 4 days.  He denies any precipitating event or injury.  He reports pain is located on the second and third digit of the right hand.  Left lower leg pain and swelling at the ankle.  Symptoms are worsening with ROM.  Reports he has been using allopurinol and some steroid that was left with mild relief.  He denies chills, fever, nausea, vomiting, diarrhea.    ROS: As per HPI.  All other pertinent ROS negative.     Past Medical History:  Diagnosis Date   (HFpEF) heart failure with preserved ejection fraction (HCC)    Acute on chronic heart failure (HCC) 05/16/2022   AKI (acute kidney injury) (HCC) 01/23/2023   Lab Results  Component  Value  Date     CREATININE  0.97  03/03/2023     CREATININE  1.14  02/19/2023     CREATININE  1.29 (H)  02/18/2023         Arthritis    knees. elbow, ankle   Cataract    CHF (congestive heart failure) (HCC)    Diabetes mellitus without complication (HCC)    Diastolic CHF (HCC) 02/14/2023   Dyspnea    when in A-fib   Dysrhythmia    A-fib   GERD (gastroesophageal reflux disease)    takes Dexilant prn   History of COVID-19 05/18/2021   Formatting of this note might be different from the original. Not requiring hospitalization Formatting of this note might be different from the original. Not requiring hospitalization   Hypertension    takes Lisinopril-HCTZ daily   Kidney injury 03/07/2023   Chronic kidney disease, stage 3a (ICD-10: N18.31): GFR 58.20 mL/min on 07/09/2023 indicates mild kidney dysfunction. Monitor closely and adjust medications as needed.            Lab Results      Component    Value    Date/Time           CREATININE    1.35    07/09/2023 02:47 PM            CREATININE    1.20    03/12/2023 12:10 PM           CREATININE    0.97    03/03/2023 02:39 PM           CREATI   Localized swelling of left lower extremity 01/11/2020   Localized swelling of right lower extremity 04/24/2020   Muscle spasm    takes Relafen daily   OSA on CPAP 03/07/2023   Sleep study tonight   Peripheral edema    Plantar fasciitis    right   Situational insomnia 01/06/2018   Sleep apnea    Sprain of deltoid ligament of left ankle 08/10/2019   Past Surgical History:  Procedure Laterality Date   CARDIOVERSION N/A 02/19/2023   Procedure: CARDIOVERSION;  Surgeon: Dolores Patty, MD;  Location: [facility] ENDOSCOPY;  Service: Cardiovascular;  Laterality: N/A;   EAR CYST EXCISION N/A 08/06/2013   Procedure: CYST REMOVAL;  Surgeon: Melvenia Beam, MD;  Location: [facility] OR;  Service: ENT;  Laterality: N/A;   ESOPHAGOGASTRODUODENOSCOPY     left knee arthroscopy  2003   PAROTIDECTOMY  Right 08/06/2013   Procedure: PAROTIDECTOMY;  Surgeon: Melvenia Beam, MD;  Location: [facility] OR;  Service: ENT;  Laterality: Right;   RIGHT HEART CATH N/A 02/17/2023   Procedure: RIGHT HEART CATH;  Surgeon: Dolores Patty, MD;  Location: [facility] INVASIVE CV LAB;  Service: Cardiovascular;  Laterality: N/A;   TEE WITHOUT CARDIOVERSION N/A 02/19/2023   Procedure: TRANSESOPHAGEAL ECHOCARDIOGRAM (TEE);  Surgeon: Dolores Patty, MD;  Location: [facility] ENDOSCOPY;  Service: Cardiovascular;  Laterality: N/A;   TONSILLECTOMY Bilateral 08/06/2013   Procedure: TONSILLECTOMY;  Surgeon: Melvenia Beam, MD;  Location: [facility] OR;  Service: ENT;  Laterality: Bilateral;   Allergies  Allergen Reactions   Iodinated Contrast Media Itching   Other Rash and Other (See Comments)    Other reaction(s): Other (See Comments)  Neoprene: Rash   No current facility-administered medications on file prior to encounter.   Current Outpatient Medications on File Prior to Encounter  Medication Sig Dispense Refill   albuterol (VENTOLIN HFA) 108  (90 Base) MCG/ACT inhaler Inhale 2 puffs into the lungs every 6 (six) hours as needed for shortness of breath.     allopurinol (ZYLOPRIM) 300 MG tablet Take 1 tablet (300 mg total) by mouth daily. 90 tablet 3   augmented betamethasone dipropionate (DIPROLENE-AF) 0.05 % cream Apply topically 2 (two) times daily as needed.     buPROPion ER (WELLBUTRIN SR) 100 MG 12 hr tablet Take 1 tablet (100 mg total) by mouth in the morning. 90 tablet 3   Clotrimazole 1 % OINT Apply 1 Application topically 2 (two) times a week. 30 g 11   Cyanocobalamin (B-12) 1000 MCG SUBL Place 1 tablet under the tongue daily at 6 (six) AM. 90 tablet 3   empagliflozin (JARDIANCE) 10 MG TABS tablet Take 1 tablet (10 mg total) by mouth daily. 90 tablet 3   erythromycin ophthalmic ointment 3 (three) times daily.     fluticasone (FLONASE) 50 MCG/ACT nasal spray Place 2 sprays into both nostrils daily. (Patient taking differently: Place 2 sprays into both nostrils daily as needed for allergies.) 16 g 0   folic acid (FOLVITE) 1 MG tablet Take 1 tablet (1 mg total) by mouth daily as needed (low iron). 90 tablet 3   levofloxacin (LEVAQUIN) 500 MG tablet TAKE 1 TABLET BY MOUTH EVERY DAY FOR 10 DAYS     levothyroxine (SYNTHROID) 50 MCG tablet Take 1 tablet (50 mcg total) by mouth daily. Needs to be adjusted every 3-6 months based on labwork if still taking amiodarone 90 tablet 3   losartan (COZAAR) 50 MG tablet Take 1 tablet by mouth daily.     meclizine (ANTIVERT) 25 MG tablet Take 25 mg by mouth 3 (three) times daily as needed for dizziness.     metoprolol succinate (TOPROL-XL) 50 MG 24 hr tablet Take 1 tablet (50 mg total) by mouth daily. 90 tablet 3   Multiple Vitamins-Minerals (MULTIVITAMIN WITH MINERALS) tablet Take 1 tablet by mouth daily.     naltrexone (DEPADE) 50 MG tablet Take 0.5 tablets (25 mg total) by mouth daily. 90 tablet 1   potassium chloride (KLOR-CON) 10 MEQ tablet Take 1 tablet (10 mEq total) by mouth daily. Take with  torsemide because torsemide lowers potassium 90 tablet 3   prednisoLONE acetate (PRED FORTE) 1 % ophthalmic suspension Place into the right eye.     predniSONE (STERAPRED UNI-PAK 48 TAB) 10 MG (48) TBPK tablet See admin instructions. see package     rivaroxaban (XARELTO) 20 MG TABS tablet Take 1  tablet (20 mg total) by mouth daily with supper. 90 tablet 3   rosuvastatin (CRESTOR) 20 MG tablet Take 1 tablet (20 mg total) by mouth daily. Replaces 10 mg dose, can cause cramps 90 tablet 3   sacubitril-valsartan (ENTRESTO) 49-51 MG Take 1 tablet by mouth 2 (two) times daily. 60 tablet 8   Semaglutide,0.25 or 0.5MG /DOS, (OZEMPIC, 0.25 OR 0.5 MG/DOSE,) 2 MG/3ML SOPN Inject 0.5 mg into the skin once a week. 3 mL 11   spironolactone (ALDACTONE) 25 MG tablet Take 1.5 tablets (37.5 mg total) by mouth daily. Increased dose, to allow to stop torsemide 90 tablet 3   torsemide (DEMADEX) 20 MG tablet Take 20 mg by mouth daily.     traMADol (ULTRAM) 50 MG tablet Take 1 tablet by mouth every 8 (eight) hours as needed.     traZODone (DESYREL) 50 MG tablet Take 25 mg by mouth at bedtime as needed.     Social History   Socioeconomic History   Marital status: Single    Spouse name: Not on file   Number of children: Not on file   Years of education: Not on file   Highest education level: Some college, no degree  Occupational History   Not on file  Tobacco Use   Smoking status: Never   Smokeless tobacco: Never  Vaping Use   Vaping status: Never Used  Substance and Sexual Activity   Alcohol use: Yes    Alcohol/week: 6.0 standard drinks of alcohol    Types: 6 Standard drinks or equivalent per week    Comment: every other day-11/16/23   Drug use: No   Sexual activity: Yes  Other Topics Concern   Not on file  Social History Narrative   Not on file   Social Drivers of Health   Financial Resource Strain: Medium Risk (04/04/2023)   Overall Financial Resource Strain (CARDIA)    Difficulty of Paying Living  Expenses: Somewhat hard  Food Insecurity: Food Insecurity Present (04/04/2023)   Hunger Vital Sign    Worried About Running Out of Food in the Last Year: Never true    Ran Out of Food in the Last Year: Sometimes true  Transportation Needs: Unmet Transportation Needs (04/04/2023)   PRAPARE - Transportation    Lack of Transportation (Medical): No    Lack of Transportation (Non-Medical): Yes  Physical Activity: Insufficiently Active (04/04/2023)   Exercise Vital Sign    Days of Exercise per Week: 4 days    Minutes of Exercise per Session: 30 min  Stress: No Stress Concern Present (05/03/2021)   Received from [facility]   [employer] of Occupational Health - Occupational Stress Questionnaire    Feeling of Stress : Only a little  Social Connections: Moderately Isolated (04/04/2023)   Social Connection and Isolation Panel [NHANES]    Frequency of Communication with Friends and Family: Once a week    Frequency of Social Gatherings with Friends and Family: Once a week    Attends Religious Services: Never    Database administrator or Organizations: Yes    Attends Engineer, structural: More than 4 times per year    Marital Status: Married  Catering manager Violence: Not At Risk (02/19/2023)   Humiliation, Afraid, Rape, and Kick questionnaire    Fear of Current or Ex-Partner: No    Emotionally Abused: No    Physically Abused: No    Sexually Abused: No   No family history on file.  OBJECTIVE:  Vitals:  11/16/23 0942 11/16/23 0943 11/16/23 0945  BP:   (!) 171/107  Pulse:   81  Resp:   18  Temp: 99.4 F (37.4 C)    TempSrc: Oral    SpO2:   98%  Weight:  258 lb (117 kg)   Height:  5\' 11"  (1.803 m)      Physical Exam Vitals and nursing note reviewed.  Constitutional:      General: He is not in acute distress.    Appearance: Normal appearance. He is normal weight. He is not ill-appearing, toxic-appearing or diaphoretic.  Cardiovascular:     Rate and  Rhythm: Normal rate and regular rhythm.     Pulses: Normal pulses.     Heart sounds: Normal heart sounds. No murmur heard.    No friction rub. No gallop.  Pulmonary:     Effort: Pulmonary effort is normal. No respiratory distress.     Breath sounds: Normal breath sounds. No stridor. No wheezing, rhonchi or rales.  Chest:     Chest wall: No tenderness.  Musculoskeletal:     Right hand: Swelling and tenderness present.     Left foot: Swelling and tenderness present.     Comments: Warm to touch  Neurological:     Mental Status: He is alert and oriented to person, place, and time.      LABS:  No results found for this or any previous visit (from the past 24 hours).   ASSESSMENT & PLAN:  1. Exacerbation of gout     Meds ordered this encounter  Medications   DISCONTD: ketorolac (TORADOL) 30 MG/ML injection 30 mg   ketorolac (TORADOL) 30 MG/ML injection 30 mg   colchicine 0.6 MG tablet    Sig: Take 1 tablet by mouth daily as needed for gout flare    Dispense:  30 tablet    Refill:  0   Discharge instructions  Prescribed colchicine take as directed and to completion Continue to take allopurinol take as directed Follow up with PCP for further evaluation and management Return or go to the ED if you have any new or worsening symptoms    Reviewed expectations re: course of current medical issues. Questions answered. Outlined signs and symptoms indicating need for more acute intervention. Patient verbalized understanding. After Visit Summary given.          Durward Parcel, FNP 11/16/23 1115

## 2023-11-17 ENCOUNTER — Ambulatory Visit: Payer: Self-pay

## 2023-11-17 NOTE — Telephone Encounter (Signed)
Was seen in [facility] on 11/16/23  Patient Name First: Parker [name] Last: Parker [name] Gender: Male DOB: 03/18/1966 Age: 57 Y 24 D Return Phone Number: [phone] (Primary) Address: City/ State/ Zip: [place]  [address] Client [facility] - Human resources officer [facility] Provider Glenetta Hew- MD Contact Type Call Who Is Calling Patient / Member / Family / Caregiver Call Type Triage / Clinical Relationship To Patient Self Return Phone Number [phone] (Primary) Chief Complaint Hand Pain Reason for Call Symptomatic / Request for Health Information Initial Comment Remade Chart #[id number]. Caller states he has throbbing pain in his hand. Translation No Nurse Assessment Nurse: Cheree Ditto, RN, Colleen Date/Time (Eastern Time): 11/14/2023 5:36:54 PM Confirm and document reason for call. If symptomatic, describe symptoms. ---Caller states that he is having throbbing pain in his right hand and his left foot. It started in his foot on Tuesday and he is limping. It started in his hand Wednesday and he cannot close it and states that it is swoller. Pain is 10/10. Caller states pain is 12. Caller states that he has taken tylenol PM which helped him sleep. No recent wounds. The hand is swollen and red. Does the patient have any new or worsening symptoms? ---Yes Will a triage be completed? ---Yes Related visit to physician within the last 2 weeks? ---No Does the PT have any chronic conditions? (i.e. diabetes, asthma, this includes High risk factors for pregnancy, etc.) ---Yes List chronic conditions. ---diabetes, gout, CHF Is this a behavioral health or substance abuse call? ---No Guidelines Guideline Title Affirmed Question Affirmed Notes Nurse Date/Time (Eastern Time) Hand Pain Patient sounds very sick or weak to the triager Cheree Ditto, RN, [facility] 11/14/2023 5:39:19 PM Disp. Time [name] Time) Disposition Final  User 11/14/2023 5:15:06 PM Send To RN Personal Schaldach, Rachael 11/14/2023 5:18:41 PM Attempt made - no message left Caren Griffins 11/14/2023 5:44:25 PM Go to ED Now (or PCP triage) Yes Cheree Ditto, RN, Colleen Final Disposition 11/14/2023 5:44:25 PM Go to ED Now (or PCP triage) Pascal Lux, RN, Benjamin Stain Disagree/Comply Disagree Caller Understands Yes PreDisposition Home Care Care Advice Given Per Guideline * IF NO PCP (PRIMARY CARE PROVIDER) SECOND-LEVEL TRIAGE: You need to be seen within the next hour. Go to the ED/UCC at _____________ Hospital. Leave as soon as you can. GO TO ED/UCC NOW (OR PCP TRIAGE): ANOTHER ADULT SHOULD DRIVE: * It is better and safer if another adult drives instead of you. BRING MEDICINES: * Bring a list of your current medicines when you go to the Emergency Department (ER). * Bring the pill bottles too. This will help the doctor (or NP/PA) to make certain you are taking the right medicines and the right dose. CARE ADVICE given per Hand Pain (Adult) guideline. Comments User: Nicoletta Dress, RN Date/Time [name] Time): 11/14/2023 5:44:20 PM Caller wants to wait until morning to go to [facility]. He does not want to go out at night. Referrals [facility] [place] [facility]

## 2023-11-18 NOTE — Telephone Encounter (Signed)
Left patient vm to call back to schedule appt  °

## 2023-12-01 ENCOUNTER — Telehealth: Payer: Self-pay | Admitting: Internal Medicine

## 2023-12-01 NOTE — Telephone Encounter (Signed)
Patient would like to speak with someone regarding his gout flare up.. he was seen at urgent care about 2 weeks ago and given colchicine 0.6 mg but he says it still flared up having one good day and then the next day hurting horribly..   Can someone give him a call back  Thank you  Parker [name] [facility] AWV TEAM Direct Dial [phone]

## 2023-12-10 NOTE — Telephone Encounter (Addendum)
 Called and got patient scheduled with PCP for 12/15/23 @ 3pm. Patient asked me what step provider would be taking in terms of treatment at the time of visit. I informed patient that this would be unknown until he sees the provider. Patient expressed frustration at ongoing illness and previous medical interventions failing. He explained he would be keeping this appointment unless he feels it's no longer needed.

## 2023-12-10 NOTE — Telephone Encounter (Signed)
 Per Havlyn: Called and got patient scheduled with PCP for 12/15/23 @ 3pm

## 2023-12-10 NOTE — Telephone Encounter (Signed)
 Noted.

## 2023-12-14 ENCOUNTER — Other Ambulatory Visit: Payer: Self-pay | Admitting: Internal Medicine

## 2023-12-14 DIAGNOSIS — I50813 Acute on chronic right heart failure: Secondary | ICD-10-CM

## 2023-12-15 ENCOUNTER — Encounter: Payer: Self-pay | Admitting: Internal Medicine

## 2023-12-15 ENCOUNTER — Ambulatory Visit (INDEPENDENT_AMBULATORY_CARE_PROVIDER_SITE_OTHER): Payer: PPO | Admitting: Internal Medicine

## 2023-12-15 VITALS — BP 160/98 | HR 67 | Temp 98.2°F | Ht 71.0 in | Wt 256.4 lb

## 2023-12-15 DIAGNOSIS — T504X5D Adverse effect of drugs affecting uric acid metabolism, subsequent encounter: Secondary | ICD-10-CM | POA: Diagnosis not present

## 2023-12-15 DIAGNOSIS — M1A9XX1 Chronic gout, unspecified, with tophus (tophi): Secondary | ICD-10-CM

## 2023-12-15 DIAGNOSIS — I10 Essential (primary) hypertension: Secondary | ICD-10-CM

## 2023-12-15 DIAGNOSIS — Z789 Other specified health status: Secondary | ICD-10-CM

## 2023-12-15 MED ORDER — MAG GLYCINATE 100 MG PO TABS: 1.0000 | ORAL_TABLET | Freq: Every day | ORAL | 3 refills | Status: DC

## 2023-12-15 MED ORDER — ALLOPURINOL 100 MG PO TABS: 50.0000 mg | ORAL_TABLET | Freq: Every day | ORAL | 1 refills | Status: DC

## 2023-12-15 NOTE — Assessment & Plan Note (Signed)
 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  Hopefully will improve with weight loss .  Patient should resume entresto which he self discontinued which is for this and cardiomyopathy

## 2023-12-15 NOTE — Assessment & Plan Note (Signed)
 Persistent and complicated by aggravating gout, cardiomyopathy, resistant hypertension.  Precontemplative for discontinuation. Encouraged patient to reduce use.

## 2023-12-15 NOTE — Patient Instructions (Addendum)
 Foods that are high in purines, which are compounds that increase uric acid levels, are most associated with gout. These include:  Organ meats: Liver, kidney, sweetbreads, tripe, and brains  Red meat: Beef, lamb, pork, and bacon  Certain seafood: Anchovies, sardines, scallops, mussels, trout, and tuna  Beer and distilled liquors: These can make it harder for the body to get rid of uric acid  Sugary drinks and foods: High-fructose corn syrup, sodas, juices, candy, and desserts  Fried foods: Can increase the risk of gout attacks  High-fat dairy products: Can increase uric acid levels and cause inflammation  Refined carbohydrates: White bread, cakes, and pasta  Foods to eat instead  Low-fat and nondairy dairy products, like yogurt and skim milk Beans, soy, and other plant-based proteins Fresh fruits and vegetables, especially low-sugar fruits Nuts and peanut butter Grains like potatoes, rice, and bread Eggs in moderation   VISIT SUMMARY:  During today's visit, we discussed your ongoing issues with gout, recent significant weight loss, and a painful cyst on your right hand. We reviewed your current treatment plan and made adjustments to better manage your symptoms. We also addressed your concerns about potential side effects and explored new treatment options. Additionally, we discussed your general health maintenance, including improvements in prediabetes and fatty liver disease, and provided recommendations for dietary modifications and supplements.  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 We will restart allopurinol  at 50 mg daily, increasing the dose weekly to the maximum you can tolerate, and continue colchicine  0.6 mg daily to prevent flares. We will also consider Krystexxa injections and refer you to a rheumatologist for this option. Strict dietary  modifications are essential, including avoiding shellfish, beer, and sugary drinks, while encouraging chicken and low-fat dairy. We will monitor your liver function and uric acid levels.  -GENERAL HEALTH MAINTENANCE: Your prediabetes has improved with significant weight loss, and we need to continue monitoring your fatty liver disease. To address low levels of magnesium , calcium , folate, and B12, we recommend taking a daily multivitamin and avoiding alcohol within 12 hours of taking it. We also advise avoiding beer and wine due to their high uric acid content, suggesting vodka, rum, gin, and whiskey as alternatives. Continue with your weight loss and dietary modifications.  INSTRUCTIONS:  Please schedule a follow-up appointment in 3-4 weeks to reassess your uric acid levels and medication tolerance. Additionally, ensure that you have a colonoscopy scheduled due to the positive Cologuard test from last year.

## 2023-12-15 NOTE — Progress Notes (Signed)
 ==============================  [facility]: [phone]   -- Medical Office Visit --  Patient: Parker [name]      Age: 58 y.o.       Sex:  male  Date:   12/15/2023 Today's Healthcare Provider: Bernardino KANDICE Cone, MD  ==============================   CHIEF COMPLAINT: Gout flare up  SUBJECTIVE: 58 y.o. male who has Plantar fasciitis of right foot; Deformity of metatarsal; Acquired equinus deformity of foot; Chronic heart failure with preserved ejection fraction (HCC); Controlled type 2 diabetes mellitus without complication, without long-term current use of insulin  (HCC); Unspecified atrial fibrillation (HCC); HTN (hypertension); Hypothyroidism; Gout; Obesity, Class III, BMI 40-49.9 (morbid obesity) (HCC); OSA (obstructive sleep apnea); IDA (iron deficiency anemia); Vertigo; High risk for readmission; Hyperlipidemia; Erectile dysfunction; History of stroke; hypokalemia, drug induced; Alcohol use; Cervical spondylosis; Chronic neck pain; Depression; Diverticulosis of large intestine without hemorrhage; Edema of both lower extremities; Erectile dysfunction due to arterial insufficiency; Idiopathic chronic gout 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; Large liver; H/O medication noncompliance; LVH (left ventricular hypertrophy); Lymphedema; Mitral valve regurgitation; Prediabetes; Severe obesity (BMI 35.0-39.9) with comorbidity (HCC); Transaminitis; Tubular adenoma of colon; Arthralgia of left ankle; Left knee pain; Hypomagnesemia; Positive cardiac stress test; Primary localized osteoarthritis of left knee; Tricuspid valve regurgitation; Alpha thalassaemia minor; Swelling of hand; Pain in right hand; Pain involving joints of fingers of both hands; NAFLD (nonalcoholic fatty liver disease); B12 deficiency; Folate deficiency; Chronic kidney disease, stage 2 (mild); Positive colorectal cancer screening using Cologuard test; and Diabetic polyneuropathy associated with type 2  diabetes mellitus (HCC) on their problem list.  History of Present Illness The patient, with a known history of gout, presents with persistent symptoms despite resuming allopurinol  therapy. The patient reports high uric acid levels and expresses frustration with the chronic nature of the disease and the perceived ineffectiveness of current treatment strategies. The patient denies significant dietary triggers, reporting a modest appetite and a diet primarily consisting of water, salads, vegetables, and fruits.  The patient also reports a significant weight loss of approximately 30 pounds over the past year, which he attributes to a loss of appetite following discontinuation of Ozempic . The patient has not resumed Ozempic  due to the sustained lack of appetite and weight loss.  In addition to gout, the patient is dealing with a painful cystic formation on the right hand, which is affecting hand function. The patient reports a history of similar cysts, which were previously treated by a dermatologist. The patient also reports a history of fatty liver disease and pre-diabetes, but notes recent improvements in these conditions, possibly related to the significant weight loss.  The patient expresses concern about the potential side effects of escalating gout treatments, including potential cardiovascular risks and liver enzyme elevations. The patient is considering a return to low-dose allopurinol  therapy and stricter dietary modifications to manage the gout symptoms. The patient also expresses interest in exploring new treatment options, such as bi-weekly injections, but is concerned about the cost and potential side effects.  Note that patient  has a past medical history of (HFpEF) heart failure with preserved ejection fraction (HCC), Acute on chronic heart failure (HCC) (05/16/2022), AKI (acute kidney injury) (HCC) (01/23/2023), Arthritis, Cataract, CHF (congestive heart failure) (HCC), Diabetes mellitus  without complication (HCC), Diastolic CHF (HCC) (02/14/2023), Dyspnea, Dysrhythmia, GERD (gastroesophageal reflux disease), History of COVID-19 (05/18/2021), Hypertension, Kidney injury (03/07/2023), Localized swelling of left lower extremity (01/11/2020), Localized swelling of right lower extremity (04/24/2020), Muscle spasm, OSA on CPAP (03/07/2023),  Peripheral edema, Plantar fasciitis, Situational insomnia (01/06/2018), Sleep apnea, and Sprain of deltoid ligament of left ankle (08/10/2019).  Problem list overviews that were updated at today's visit:No problems updated.  Med reconciliation: Current Outpatient Medications on File Prior to Visit  Medication Sig   albuterol  (VENTOLIN  HFA) 108 (90 Base) MCG/ACT inhaler Inhale 2 puffs into the lungs every 6 (six) hours as needed for shortness of breath.   allopurinol  (ZYLOPRIM ) 300 MG tablet Take 1 tablet (300 mg total) by mouth daily.   augmented betamethasone dipropionate (DIPROLENE-AF) 0.05 % cream Apply topically 2 (two) times daily as needed.   buPROPion  ER (WELLBUTRIN  SR) 100 MG 12 hr tablet Take 1 tablet (100 mg total) by mouth in the morning.   Clotrimazole  1 % OINT Apply 1 Application topically 2 (two) times a week.   colchicine  0.6 MG tablet Take 1 tablet by mouth daily as needed for gout flare   Cyanocobalamin  (B-12) 1000 MCG SUBL Place 1 tablet under the tongue daily at 6 (six) AM.   empagliflozin  (JARDIANCE ) 10 MG TABS tablet Take 1 tablet (10 mg total) by mouth daily.   erythromycin ophthalmic ointment 3 (three) times daily.   fluticasone  (FLONASE ) 50 MCG/ACT nasal spray Place 2 sprays into both nostrils daily. (Patient taking differently: Place 2 sprays into both nostrils daily as needed for allergies.)   folic acid  (FOLVITE ) 1 MG tablet Take 1 tablet (1 mg total) by mouth daily as needed (low iron).   levofloxacin (LEVAQUIN) 500 MG tablet TAKE 1 TABLET BY MOUTH EVERY DAY FOR 10 DAYS   levothyroxine  (SYNTHROID ) 50 MCG tablet Take 1  tablet (50 mcg total) by mouth daily. Needs to be adjusted every 3-6 months based on labwork if still taking amiodarone    losartan  (COZAAR ) 50 MG tablet Take 1 tablet by mouth daily.   meclizine (ANTIVERT) 25 MG tablet Take 25 mg by mouth 3 (three) times daily as needed for dizziness.   metoprolol  succinate (TOPROL -XL) 50 MG 24 hr tablet Take 1 tablet (50 mg total) by mouth daily.   Multiple Vitamins-Minerals (MULTIVITAMIN WITH MINERALS) tablet Take 1 tablet by mouth daily.   naltrexone  (DEPADE) 50 MG tablet Take 0.5 tablets (25 mg total) by mouth daily.   potassium chloride  (KLOR-CON ) 10 MEQ tablet Take 1 tablet (10 mEq total) by mouth daily. Take with torsemide  because torsemide  lowers potassium   prednisoLONE acetate (PRED FORTE) 1 % ophthalmic suspension Place into the right eye.   rivaroxaban  (XARELTO ) 20 MG TABS tablet Take 1 tablet (20 mg total) by mouth daily with supper.   rosuvastatin  (CRESTOR ) 20 MG tablet Take 1 tablet (20 mg total) by mouth daily. Replaces 10 mg dose, can cause cramps   Semaglutide ,0.25 or 0.5MG /DOS, (OZEMPIC , 0.25 OR 0.5 MG/DOSE,) 2 MG/3ML SOPN Inject 0.5 mg into the skin once a week.   spironolactone  (ALDACTONE ) 25 MG tablet Take 1.5 tablets (37.5 mg total) by mouth daily. Increased dose, to allow to stop torsemide    torsemide  (DEMADEX ) 20 MG tablet TAKE 1 TABLET BY MOUTH EVERY DAY   traMADol  (ULTRAM ) 50 MG tablet Take 1 tablet by mouth every 8 (eight) hours as needed.   traZODone  (DESYREL ) 50 MG tablet Take 25 mg by mouth at bedtime as needed.   predniSONE  (STERAPRED UNI-PAK 48 TAB) 10 MG (48) TBPK tablet See admin instructions. see package (Patient not taking: Reported on 12/15/2023)   sacubitril -valsartan  (ENTRESTO ) 49-51 MG Take 1 tablet by mouth 2 (two) times daily. (Patient not taking: Reported on 12/15/2023)  No current facility-administered medications on file prior to visit.  There are no discontinued medications.    Objective   Physical Exam      12/15/2023    3:27 PM 12/15/2023    3:00 PM 11/16/2023    9:45 AM  Vitals with BMI  Height  5' 11   Weight  256 lbs 6 oz   BMI  35.78   Systolic 160 160 828  Diastolic 98 106 107  Pulse  67 81   Wt Readings from Last 10 Encounters:  12/15/23 256 lb 6.4 oz (116.3 kg)  11/16/23 258 lb (117 kg)  10/24/23 260 lb 6.4 oz (118.1 kg)  08/11/23 265 lb 6.4 oz (120.4 kg)  07/09/23 258 lb 6.4 oz (117.2 kg)  06/13/23 260 lb (117.9 kg)  05/29/23 262 lb 12.8 oz (119.2 kg)  04/07/23 270 lb 3.2 oz (122.6 kg)  03/07/23 280 lb 6.4 oz (127.2 kg)  03/03/23 279 lb 6.4 oz (126.7 kg)   Vital signs reviewed.  Nursing notes reviewed. Weight trend reviewed. 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 Decreased grip strength in the right hand. Edema in the left lower extremity. SKIN: Cystic formation on the left hand, not consistent with tophi.  Another hard lump on right hand consistent with tophi General Appearance:  No acute distress appreciable.   Well-groomed, healthy-appearing male.  Well proportioned with no abnormal fat distribution.  Good muscle tone. Pulmonary:  Normal work of breathing at rest, no respiratory distress apparent. SpO2: 97 %  Musculoskeletal: All extremities are intact.  Neurological:  Awake, alert, oriented, and engaged.  No obvious focal neurological deficits or cognitive impairments.  Sensorium seems unclouded.   Speech is clear and coherent with logical content. Psychiatric:  Appropriate mood, pleasant and cooperative demeanor, thoughtful and engaged during the exam    No results found for any visits on 12/15/23. Office Visit on 08/11/2023  Component Date Value   Sodium 08/11/2023 144    Potassium 08/11/2023 3.5    Chloride 08/11/2023 98    CO2 08/11/2023 34 (H)    Glucose, Bld 08/11/2023 96    BUN 08/11/2023 8    Creatinine, Ser  08/11/2023 1.02    Total Bilirubin 08/11/2023 1.0    Alkaline Phosphatase 08/11/2023 83    AST 08/11/2023 88 (H)    ALT 08/11/2023 48    Total Protein 08/11/2023 7.3    Albumin  08/11/2023 4.1    GFR 08/11/2023 81.41    Calcium  08/11/2023 9.4    Magnesium  08/11/2023 1.4 (L)    Uric Acid, Serum 08/11/2023 9.8 (H)    WBC 08/11/2023 5.6    RBC 08/11/2023 5.81    Hemoglobin 08/11/2023 15.4    HCT 08/11/2023 49.3    MCV 08/11/2023 84.8    MCHC 08/11/2023 31.2    RDW 08/11/2023 15.7 (H)    Platelets 08/11/2023 250.0    Neutrophils Relative % 08/11/2023 56.4    Lymphocytes Relative 08/11/2023 28.5    Monocytes Relative 08/11/2023 12.0    Eosinophils Relative 08/11/2023 1.5    Basophils Relative 08/11/2023 1.6    Neutro Abs 08/11/2023 3.2    Lymphs Abs 08/11/2023 1.6    Monocytes Absolute 08/11/2023 0.7    Eosinophils Absolute 08/11/2023 0.1    Basophils Absolute 08/11/2023 0.1    Ferritin 08/11/2023 265.7    Vitamin B-12 08/11/2023 176 (L)  Folate 08/11/2023 4.0 (L)    Cholesterol 08/11/2023 202 (H)    Triglycerides 08/11/2023 183.0 (H)    HDL 08/11/2023 55.20    VLDL 08/11/2023 36.6    LDL Cholesterol 08/11/2023 110 (H)    Total CHOL/HDL Ratio 08/11/2023 4    NonHDL 08/11/2023 146.38   Office Visit on 07/09/2023  Component Date Value   Uric Acid, Serum 07/09/2023 10.9 (H)    Hgb A1c MFr Bld 07/09/2023 6.1    Cholesterol 07/09/2023 220 (H)    Triglycerides 07/09/2023 281.0 (H)    HDL 07/09/2023 51.20    VLDL 07/09/2023 56.2 (H)    Total CHOL/HDL Ratio 07/09/2023 4    NonHDL 07/09/2023 169.27    TSH 07/09/2023 4.870 (H)    WBC 07/09/2023 5.6    RBC 07/09/2023 6.01 (H)    Hemoglobin 07/09/2023 15.8    HCT 07/09/2023 50.7    MCV 07/09/2023 84.3    MCHC 07/09/2023 31.2    RDW 07/09/2023 15.9 (H)    Platelets 07/09/2023 239.0    Neutrophils Relative % 07/09/2023 55.3    Lymphocytes Relative 07/09/2023 32.7    Monocytes Relative 07/09/2023 8.8    Eosinophils Relative  07/09/2023 1.5    Basophils Relative 07/09/2023 1.7    Neutro Abs 07/09/2023 3.1    Lymphs Abs 07/09/2023 1.8    Monocytes Absolute 07/09/2023 0.5    Eosinophils Absolute 07/09/2023 0.1    Basophils Absolute 07/09/2023 0.1    Sodium 07/09/2023 138    Potassium 07/09/2023 3.4 (L)    Chloride 07/09/2023 97    CO2 07/09/2023 27    Glucose, Bld 07/09/2023 95    BUN 07/09/2023 11    Creatinine, Ser 07/09/2023 1.35    Total Bilirubin 07/09/2023 1.2    Alkaline Phosphatase 07/09/2023 82    AST 07/09/2023 63 (H)    ALT 07/09/2023 46    Total Protein 07/09/2023 7.5    Albumin  07/09/2023 4.4    GFR 07/09/2023 58.20 (L)    Calcium  07/09/2023 9.4    T4,Free (Direct) 07/09/2023 1.14    Direct LDL 07/09/2023 144.0   Abstract on 05/29/2023  Component Date Value   [facility] Diabetic Eye Exam 04/16/2023 No Retinopathy   Lab on 04/23/2023  Component Date Value   Microalb, Ur 04/23/2023 2.6 (H)    Creatinine,U 04/23/2023 425.0    Microalb Creat Ratio 04/23/2023 0.6    Hgb F 04/23/2023 0.0    Hgb A 04/23/2023 98.0    Hgb A2 04/23/2023 2.0    Hgb S 04/23/2023 0.0    Interpretation, Hgb Fract 04/23/2023 Comment    Ferritin 04/23/2023 270    WBC 04/23/2023 5.6    RBC 04/23/2023 6.72 (H)    Hemoglobin 04/23/2023 15.8    Hematocrit 04/23/2023 50.5    MCV 04/23/2023 75 (L)    MCH 04/23/2023 23.5 (L)    MCHC 04/23/2023 31.3 (L)    RDW 04/23/2023 23.5 (H)    Platelets 04/23/2023 211    Alpha Thal Reflex 04/23/2023 Comment    Free T4 04/23/2023 0.88    TSH 04/23/2023 3.25    Testosterone  04/23/2023 332.31    Alpha-Thalassemia 04/23/2023 Comment:   Hospital Outpatient Visit on 03/12/2023  Component Date Value   Sodium 03/12/2023 135    Potassium 03/12/2023 4.1    Chloride 03/12/2023 100    CO2 03/12/2023 21 (L)    Glucose, Bld 03/12/2023 103 (H)    BUN 03/12/2023 13    Creatinine, Ser 03/12/2023 1.20  Calcium  03/12/2023 9.6    GFR, Estimated 03/12/2023 >60    Anion gap 03/12/2023 14    Office Visit on 03/07/2023  Component Date Value   Microalb, Ur 03/07/2023 <0.7    Creatinine,U 03/07/2023 15.1    Microalb Creat Ratio 03/07/2023 4.6    COLOGUARD 04/23/2023 Positive (A)   Hospital Outpatient Visit on 03/03/2023  Component Date Value   WBC 03/03/2023 5.6    RBC 03/03/2023 7.01 (H)    Hemoglobin 03/03/2023 15.4    HCT 03/03/2023 46.8    MCV 03/03/2023 66.8 (L)    MCH 03/03/2023 22.0 (L)    MCHC 03/03/2023 32.9    RDW 03/03/2023 22.4 (H)    Platelets 03/03/2023 225    nRBC 03/03/2023 0.0    B Natriuretic Peptide 03/03/2023 98.1    Sodium 03/03/2023 138    Potassium 03/03/2023 3.6    Chloride 03/03/2023 102    CO2 03/03/2023 23    Glucose, Bld 03/03/2023 108 (H)    BUN 03/03/2023 11    Creatinine, Ser 03/03/2023 0.97    Calcium  03/03/2023 9.0    GFR, Estimated 03/03/2023 >60    Anion gap 03/03/2023 13   Hospital Outpatient Visit on 02/14/2023  Component Date Value   Sodium 02/14/2023 139    Potassium 02/14/2023 3.3 (L)    Chloride 02/14/2023 100    CO2 02/14/2023 29    Glucose, Bld 02/14/2023 113 (H)    BUN 02/14/2023 7    Creatinine, Ser 02/14/2023 0.83    Calcium  02/14/2023 8.7 (L)    Total Protein 02/14/2023 7.2    Albumin  02/14/2023 3.5    AST 02/14/2023 31    ALT 02/14/2023 15    Alkaline Phosphatase 02/14/2023 54    Total Bilirubin 02/14/2023 1.1    GFR, Estimated 02/14/2023 >60    Anion gap 02/14/2023 10    WBC 02/14/2023 5.0    RBC 02/14/2023 5.69    Hemoglobin 02/14/2023 12.2 (L)    HCT 02/14/2023 38.3 (L)    MCV 02/14/2023 67.3 (L)    MCH 02/14/2023 21.4 (L)    MCHC 02/14/2023 31.9    RDW 02/14/2023 20.5 (H)    Platelets 02/14/2023 199    nRBC 02/14/2023 0.0    B Natriuretic Peptide 02/14/2023 248.5 (H)    TSH 02/14/2023 3.423    Lactic Acid, Venous 02/14/2023 1.6   Admission on 01/23/2023, Discharged on 01/25/2023  Component Date Value   WBC 01/23/2023 6.5    RBC 01/23/2023 5.48    Hemoglobin 01/23/2023 11.8 (L)    HCT  01/23/2023 37.2 (L)    MCV 01/23/2023 67.9 (L)    MCH 01/23/2023 21.5 (L)    MCHC 01/23/2023 31.7    RDW 01/23/2023 19.7 (H)    Platelets 01/23/2023 228    nRBC 01/23/2023 0.0    Troponin I (High Sensiti* 01/23/2023 35 (H)    Magnesium  01/23/2023 1.7    B Natriuretic Peptide 01/23/2023 377.4 (H)    Sodium 01/23/2023 137    Potassium 01/23/2023 3.6    Chloride 01/23/2023 101    CO2 01/23/2023 21 (L)    Glucose, Bld 01/23/2023 102 (H)    BUN 01/23/2023 20    Creatinine, Ser 01/23/2023 1.41 (H)    Calcium  01/23/2023 8.8 (L)    Total Protein 01/23/2023 7.4    Albumin  01/23/2023 3.6    AST 01/23/2023 24    ALT 01/23/2023 12    Alkaline Phosphatase 01/23/2023 60    Total Bilirubin 01/23/2023  1.2    GFR, Estimated 01/23/2023 58 (L)    Anion gap 01/23/2023 15    Troponin I (High Sensiti* 01/23/2023 31 (H)    D-Dimer, Quant 01/23/2023 10.31 (H)    WBC 01/23/2023 7.0    RBC 01/23/2023 5.64    Hemoglobin 01/23/2023 12.4 (L)    HCT 01/23/2023 38.0 (L)    MCV 01/23/2023 67.4 (L)    MCH 01/23/2023 22.0 (L)    MCHC 01/23/2023 32.6    RDW 01/23/2023 20.1 (H)    Platelets 01/23/2023 231    nRBC 01/23/2023 0.0    Creatinine, Ser 01/23/2023 1.39 (H)    GFR, Estimated 01/23/2023 59 (L)    Sodium 01/24/2023 137    Potassium 01/24/2023 3.4 (L)    Chloride 01/24/2023 99    CO2 01/24/2023 26    Glucose, Bld 01/24/2023 110 (H)    BUN 01/24/2023 21 (H)    Creatinine, Ser 01/24/2023 1.32 (H)    Calcium  01/24/2023 8.8 (L)    GFR, Estimated 01/24/2023 >60    Anion gap 01/24/2023 12    Magnesium  01/24/2023 1.7    WBC 01/24/2023 6.0    RBC 01/24/2023 5.63    Hemoglobin 01/24/2023 12.2 (L)    HCT 01/24/2023 38.1 (L)    MCV 01/24/2023 67.7 (L)    MCH 01/24/2023 21.7 (L)    MCHC 01/24/2023 32.0    RDW 01/24/2023 19.8 (H)    Platelets 01/24/2023 215    nRBC 01/24/2023 0.0    Neutrophils Relative % 01/24/2023 58    Neutro Abs 01/24/2023 3.5    Lymphocytes Relative 01/24/2023 30    Lymphs  Abs 01/24/2023 1.8    Monocytes Relative 01/24/2023 4    Monocytes Absolute 01/24/2023 0.2    Eosinophils Relative 01/24/2023 4    Eosinophils Absolute 01/24/2023 0.2    Basophils Relative 01/24/2023 4    Basophils Absolute 01/24/2023 0.2 (H)    nRBC 01/24/2023 0    Abs Immature Granulocytes 01/24/2023 0.00    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    Weight 01/24/2023 4,761.94    Height 01/24/2023 71    BP 01/24/2023 121/80    S' Lateral 01/24/2023 3.40    Est EF 01/24/2023 55 - 60%    HIV Screen 4th Generatio* 01/24/2023 Non Reactive    TSH 01/24/2023 3.562    Hgb A1c MFr Bld 01/25/2023 6.1 (H)    Mean Plasma Glucose 01/25/2023 128.37    Sodium 01/25/2023 135    Potassium 01/25/2023 3.8    Chloride 01/25/2023 100    CO2 01/25/2023 23    Glucose, Bld 01/25/2023 173 (H)    BUN 01/25/2023 23 (H)    Creatinine, Ser 01/25/2023 1.23    Calcium  01/25/2023 9.0    GFR, Estimated 01/25/2023 >60    Anion gap 01/25/2023 12   No image results found. No results found.No results found.     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 Chronic gout with tophaceous deposits in the right hand causes significant pain and deformity, with an elevated uric acid level of 10.9. Previous use of allopurinol  300 mg resulted in diarrhea, vision issues, and neuropathy, while colchicine  0.6 mg was ineffective for pain and inflammation. Restart allopurinol  at 50 mg daily, titrating up weekly to the maximum tolerated dose, and monitor for side effects. Consider Krystexxa (pegloticase) injections, though  effective but expensive. Refer to rheumatology for potential Krystexxa injections. Continue colchicine  0.6 mg daily  to prevent flares. Emphasized strict dietary modifications, avoiding shellfish, beer, and sugary drinks, while encouraging chicken and low-fat dairy. Order liver function tests and uric acid levels today. Follow up in 3-4 weeks  to reassess uric acid levels and medication tolerance. Adverse effect of allopurinol , subsequent encounter  Hypocalcemia  Hypomagnesemia  Alcohol use Persistent and complicated by aggravating gout, cardiomyopathy, resistant hypertension.  Precontemplative for discontinuation. Encouraged patient to reduce use. Primary hypertension 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  Hopefully will improve with weight loss .  Patient should resume entresto  which he self discontinued which is for this and cardiomyopathy      Orders Placed During this Encounter:  No orders of the defined types were placed in this encounter.  No orders of the defined types were placed in this encounter.   General Health Maintenance Prediabetes has improved with a 30 lbs weight loss over the past year. Fatty liver disease and low magnesium , calcium , folate, and B12 levels are likely exacerbated by alcohol consumption. Advise avoiding beer and wine due to high uric acid content, suggesting vodka, rum, gin, and whiskey as alternatives if unable to fully dc alcohol as per recommendations. Encourage continuation of weight loss and dietary modifications. Recommend a daily multivitamin for magnesium , calcium , folate, and B12 deficiencies, and advise avoiding alcohol within 12 hours of taking the multivitamin. Order liver function tests and uric acid levels today.    Follow-up Schedule a follow-up in 3-4 weeks to reassess uric acid levels and medication tolerance. Ensure a colonoscopy is scheduled due to a positive Cologuard test from last year.    Attestation:  I have personally spent  48 minutes involved in face-to-face and non-face-to-face activities for this patient on the day of the visit. Professional time spent includes the following activities:  Preparing to see the patient by reviewing medical records prior to and during the encounter; Obtaining, documenting, and reviewing an  updated medical history; Performing a medically appropriate examination;  Evaluating, synthesizing, and documenting the available clinical information in the EMR;   Communicating, counseling, educating about results to the patient (not separately reported); Collaboratively developing and communicating an individualized treatment plan with the patient; Placing medically necessary orders (for medications/tests/procedures/referrals);   This time was independent of any separately billable procedure(s).  The extended duration of this patient visit was medically necessary due to several factors:  The patient's health condition is multifaceted, requiring a comprehensive evaluation of patient and their past records to ensure accurate diagnosis and treatment planning;  He is particularly complex due to having side effect(s) from allopurinol  so we had full discussion of all treatment options and their risks and benefits.  Effective patient education and communication, particularly for patients with complex care needs, often require additional time to ensure the patient (or caregivers) fully understand the care plan;  Coordination of care with other healthcare professionals and services depends on thorough documentation, extending both documentation time and visit durations.  All these factors are integral to providing high-quality patient care and ensuring optimal health outcomes.   This document was synthesized by artificial intelligence ([employer]) using HIPAA-compliant recording of the clinical interaction;   We discussed the use of AI scribe software for clinical note transcription with the patient, who gave verbal consent to proceed.    Additional Info: This encounter employed state-of-the-art, real-time, collaborative documentation. The patient actively reviewed and assisted in updating their electronic medical record on a shared screen, ensuring transparency and facilitating joint problem-solving  for the problem  list, overview, and plan. This approach promotes accurate, informed care. The treatment plan was discussed and reviewed in detail, including medication safety, potential side effects, and all patient questions. We confirmed understanding and comfort with the plan. Follow-up instructions were established, including contacting the office for any concerns, returning if symptoms worsen, persist, or new symptoms develop, and precautions for potential emergency department visits.

## 2023-12-16 ENCOUNTER — Encounter: Payer: Self-pay | Admitting: Internal Medicine

## 2023-12-16 DIAGNOSIS — R7401 Elevation of levels of liver transaminase levels: Secondary | ICD-10-CM | POA: Insufficient documentation

## 2023-12-16 DIAGNOSIS — E559 Vitamin D deficiency, unspecified: Secondary | ICD-10-CM | POA: Insufficient documentation

## 2023-12-16 DIAGNOSIS — R7989 Other specified abnormal findings of blood chemistry: Secondary | ICD-10-CM | POA: Insufficient documentation

## 2023-12-16 LAB — VITAMIN D 25 HYDROXY (VIT D DEFICIENCY, FRACTURES): VITD: 8.45 ng/mL — ABNORMAL LOW (ref 30.00–100.00)

## 2023-12-16 LAB — MAGNESIUM: Magnesium: 1.4 mg/dL — ABNORMAL LOW (ref 1.5–2.5)

## 2023-12-16 LAB — CBC WITH DIFFERENTIAL/PLATELET
Basophils Absolute: 0.1 10*3/uL (ref 0.0–0.1)
Basophils Relative: 1.7 % (ref 0.0–3.0)
Eosinophils Absolute: 0.1 10*3/uL (ref 0.0–0.7)
Eosinophils Relative: 2.2 % (ref 0.0–5.0)
HCT: 46.8 % (ref 39.0–52.0)
Hemoglobin: 14.8 g/dL (ref 13.0–17.0)
Lymphocytes Relative: 48.5 % — ABNORMAL HIGH (ref 12.0–46.0)
Lymphs Abs: 2.4 10*3/uL (ref 0.7–4.0)
MCHC: 31.7 g/dL (ref 30.0–36.0)
MCV: 85.5 fL (ref 78.0–100.0)
Monocytes Absolute: 0.4 10*3/uL (ref 0.1–1.0)
Monocytes Relative: 9.2 % (ref 3.0–12.0)
Neutro Abs: 1.9 10*3/uL (ref 1.4–7.7)
Neutrophils Relative %: 38.4 % — ABNORMAL LOW (ref 43.0–77.0)
Platelets: 250 10*3/uL (ref 150.0–400.0)
RBC: 5.47 Mil/uL (ref 4.22–5.81)
RDW: 15 % (ref 11.5–15.5)
WBC: 4.9 10*3/uL (ref 4.0–10.5)

## 2023-12-16 LAB — COMPREHENSIVE METABOLIC PANEL
ALT: 38 U/L (ref 0–53)
AST: 90 U/L — ABNORMAL HIGH (ref 0–37)
Albumin: 4.1 g/dL (ref 3.5–5.2)
Alkaline Phosphatase: 78 U/L (ref 39–117)
BUN: 5 mg/dL — ABNORMAL LOW (ref 6–23)
CO2: 23 meq/L (ref 19–32)
Calcium: 8.9 mg/dL (ref 8.4–10.5)
Chloride: 102 meq/L (ref 96–112)
Creatinine, Ser: 0.83 mg/dL (ref 0.40–1.50)
GFR: 96.72 mL/min (ref >60.00)
Glucose, Bld: 85 mg/dL (ref 70–99)
Potassium: 3.7 meq/L (ref 3.5–5.1)
Sodium: 141 meq/L (ref 135–145)
Total Bilirubin: 0.6 mg/dL (ref 0.2–1.2)
Total Protein: 7.7 g/dL (ref 6.0–8.3)

## 2023-12-16 LAB — URIC ACID: Uric Acid, Serum: 9.1 mg/dL — ABNORMAL HIGH (ref 4.0–7.8)

## 2023-12-16 NOTE — Progress Notes (Signed)
 Reviewed labs from 12/15/23. Notable findings: Severe vitamin D  deficiency (8.45 ng/mL), elevated liver enzymes (AST 90), and low magnesium  (1.4). Abnormal CBC with low neutrophils (38.4%) and elevated lymphocytes (48.5%). Liver enzymes and magnesium  addressed at recent visit. Starting vitamin D  supplementation. Detailed explanation and plan provided in Patient Message.

## 2023-12-17 NOTE — Progress Notes (Signed)
 Left vm for patient to call the office back for lab results/notes. Mailed lab results/notes to patient.

## 2023-12-17 NOTE — Telephone Encounter (Signed)
 Left vm for patient to call the office back for lab results/notes. Mailing lab results.

## 2024-01-06 ENCOUNTER — Telehealth (HOSPITAL_COMMUNITY): Payer: Self-pay | Admitting: Family Medicine

## 2024-01-06 ENCOUNTER — Other Ambulatory Visit: Payer: Self-pay | Admitting: Orthopedic Surgery

## 2024-01-06 DIAGNOSIS — M65932 Unspecified synovitis and tenosynovitis, left forearm: Secondary | ICD-10-CM | POA: Diagnosis not present

## 2024-01-07 ENCOUNTER — Ambulatory Visit: Payer: PPO | Admitting: Internal Medicine

## 2024-01-08 ENCOUNTER — Telehealth (HOSPITAL_COMMUNITY): Payer: Self-pay

## 2024-01-08 NOTE — Telephone Encounter (Signed)
  AD[facility]ANCED [facility] FAIL[facility]IN[facility]   Pre-operative Risk Assessment      Request for Surgical Clearance  Procedure:  EXTENSOR TENOSYNOVECTOMY LEFT WRIST  { Date of Surgery:  Clearance 01/26/24                                Surgeon:  Dr. Franky Curia Surgeon's Group or Practice Name:  [facility]  Phone number:  [phone] Fax number:  [phone] { Type of Clearance Requested:   - Medical  - Pharmacy:  Hold Rivaroxaban  (Xarelto ) how long can patient need to hold xarelto  and is this okay?  { Type of Anesthesia:   Auxillary block   Additional requests/questions:  Please fax a copy of clearance with medication recommendations to the surgeon's office.  Signed, Parker [name]   01/08/2024, 11:53 AM   [facility]

## 2024-01-09 NOTE — Telephone Encounter (Signed)
 [place], [place], [place]  You20 minutes ago (9:20 AM)    OK from CV standpoint for procedure. Can hold Xarelto  1 day before procedure and resume as soon as safe to do so afterwards    Routed to requesting office via [employer]

## 2024-01-14 ENCOUNTER — Encounter: Payer: Self-pay | Admitting: Internal Medicine

## 2024-01-19 ENCOUNTER — Telehealth (HOSPITAL_COMMUNITY): Payer: Self-pay

## 2024-01-19 NOTE — Telephone Encounter (Signed)
Called and left patient a voice message  to confirm/remind patient of their appointment at the [facility] on 01/20/24.   And to bring in all medications and/or complete list.

## 2024-01-20 ENCOUNTER — Encounter (HOSPITAL_BASED_OUTPATIENT_CLINIC_OR_DEPARTMENT_OTHER): Payer: Self-pay | Admitting: Orthopedic Surgery

## 2024-01-20 ENCOUNTER — Ambulatory Visit (HOSPITAL_COMMUNITY)
Admission: RE | Admit: 2024-01-20 | Discharge: 2024-01-20 | Disposition: A | Discharge status: Home / Self Care | Payer: PPO | Source: Ambulatory Visit | Attending: Family Medicine | Admitting: Family Medicine

## 2024-01-20 ENCOUNTER — Other Ambulatory Visit: Payer: Self-pay

## 2024-01-20 ENCOUNTER — Encounter (HOSPITAL_COMMUNITY): Payer: Self-pay

## 2024-01-20 VITALS — BP 166/110 | HR 66 | Wt 250.2 lb

## 2024-01-20 DIAGNOSIS — M109 Gout, unspecified: Secondary | ICD-10-CM | POA: Insufficient documentation

## 2024-01-20 DIAGNOSIS — R0602 Shortness of breath: Secondary | ICD-10-CM | POA: Diagnosis not present

## 2024-01-20 DIAGNOSIS — Z7984 Long term (current) use of oral hypoglycemic drugs: Secondary | ICD-10-CM | POA: Insufficient documentation

## 2024-01-20 DIAGNOSIS — E669 Obesity, unspecified: Secondary | ICD-10-CM | POA: Diagnosis not present

## 2024-01-20 DIAGNOSIS — G4733 Obstructive sleep apnea (adult) (pediatric): Secondary | ICD-10-CM

## 2024-01-20 DIAGNOSIS — E1122 Type 2 diabetes mellitus with diabetic chronic kidney disease: Secondary | ICD-10-CM | POA: Diagnosis not present

## 2024-01-20 DIAGNOSIS — Z8673 Personal history of transient ischemic attack (TIA), and cerebral infarction without residual deficits: Secondary | ICD-10-CM | POA: Insufficient documentation

## 2024-01-20 DIAGNOSIS — I4819 Other persistent atrial fibrillation: Secondary | ICD-10-CM | POA: Insufficient documentation

## 2024-01-20 DIAGNOSIS — Z6834 Body mass index (BMI) 34.0-34.9, adult: Secondary | ICD-10-CM | POA: Diagnosis not present

## 2024-01-20 DIAGNOSIS — Z79899 Other long term (current) drug therapy: Secondary | ICD-10-CM | POA: Diagnosis not present

## 2024-01-20 DIAGNOSIS — I5032 Chronic diastolic (congestive) heart failure: Secondary | ICD-10-CM | POA: Diagnosis not present

## 2024-01-20 DIAGNOSIS — E039 Hypothyroidism, unspecified: Secondary | ICD-10-CM | POA: Insufficient documentation

## 2024-01-20 DIAGNOSIS — I10 Essential (primary) hypertension: Secondary | ICD-10-CM | POA: Diagnosis not present

## 2024-01-20 DIAGNOSIS — Z7901 Long term (current) use of anticoagulants: Secondary | ICD-10-CM | POA: Insufficient documentation

## 2024-01-20 DIAGNOSIS — I639 Cerebral infarction, unspecified: Secondary | ICD-10-CM | POA: Diagnosis not present

## 2024-01-20 DIAGNOSIS — I38 Endocarditis, valve unspecified: Secondary | ICD-10-CM | POA: Diagnosis not present

## 2024-01-20 DIAGNOSIS — E119 Type 2 diabetes mellitus without complications: Secondary | ICD-10-CM

## 2024-01-20 DIAGNOSIS — I272 Pulmonary hypertension, unspecified: Secondary | ICD-10-CM | POA: Insufficient documentation

## 2024-01-20 DIAGNOSIS — N1831 Chronic kidney disease, stage 3a: Secondary | ICD-10-CM | POA: Diagnosis not present

## 2024-01-20 DIAGNOSIS — I130 Hypertensive heart and chronic kidney disease with heart failure and stage 1 through stage 4 chronic kidney disease, or unspecified chronic kidney disease: Secondary | ICD-10-CM | POA: Diagnosis not present

## 2024-01-20 LAB — CBC
HCT: 46.9 % (ref 39.0–52.0)
Hemoglobin: 15.7 g/dL (ref 13.0–17.0)
MCH: 26.6 pg (ref 26.0–34.0)
MCHC: 33.5 g/dL (ref 30.0–36.0)
MCV: 79.5 fL — ABNORMAL LOW (ref 80.0–100.0)
Platelets: 233 10*3/uL (ref 150–400)
RBC: 5.9 MIL/uL — ABNORMAL HIGH (ref 4.22–5.81)
RDW: 14.8 % (ref 11.5–15.5)
WBC: 6.4 10*3/uL (ref 4.0–10.5)
nRBC: 0.6 % — ABNORMAL HIGH (ref 0.0–0.2)

## 2024-01-20 LAB — BASIC METABOLIC PANEL
Anion gap: 13 (ref 5–15)
BUN: <5 mg/dL — ABNORMAL LOW (ref 6–20)
CO2: 27 mmol/L (ref 22–32)
Calcium: 8.5 mg/dL — ABNORMAL LOW (ref 8.9–10.3)
Chloride: 99 mmol/L (ref 98–111)
Creatinine, Ser: 1.11 mg/dL (ref 0.61–1.24)
GFR, Estimated: >60 mL/min (ref >60)
Glucose, Bld: 106 mg/dL — ABNORMAL HIGH (ref 70–99)
Potassium: 3.6 mmol/L (ref 3.5–5.1)
Sodium: 139 mmol/L (ref 135–145)

## 2024-01-20 LAB — BRAIN NATRIURETIC PEPTIDE: B Natriuretic Peptide: 127.9 pg/mL — ABNORMAL HIGH (ref 0.0–100.0)

## 2024-01-20 LAB — TSH: TSH: 9.186 u[IU]/mL — ABNORMAL HIGH (ref 0.350–4.500)

## 2024-01-20 MED ORDER — SACUBITRIL-VALSARTAN 97-103 MG PO TABS: 1.0000 | ORAL_TABLET | Freq: Two times a day (BID) | ORAL | 11 refills | Status: AC

## 2024-01-20 NOTE — Progress Notes (Signed)
ReDS Vest / Clip - 01/20/24 1200       ReDS Vest / Clip   Station Marker D    Ruler Value 38    ReDS Value Range Low volume    ReDS Actual Value 31

## 2024-01-20 NOTE — Progress Notes (Signed)
   01/20/24 1039  PAT Phone Screen  Is the patient taking a GLP-1 receptor agonist? No  Do You Have Diabetes? Yes  Do You Have Hypertension? Yes  Have You Ever Been to the ER for Asthma? No  Have You Taken Oral Steroids in the Past 3 Months? No  Do you Take Phenteramine or any Other Diet Drugs? No  Recent  Lab Work, EKG, CXR? Yes  Where was this test performed? 03-03-23 EKG  Do you have a history of heart problems? (S)  Yes (heart failure with preserved EF, A-fib)  Cardiologist Name Dr Gala Romney  Have you ever had tests on your heart? Yes  What cardiac tests were performed? Cardiac Cath;Echo  What date/year were cardiac tests completed? 02-19-23 ECHO EF 55-60%, 02-17-23 Heart cath  Results viewable: [facility] Media Tab  Any Recent Hospitalizations? No  Height 5\' 11"  (1.803 m)  Weight 112.5 kg  Pat Appointment Scheduled (S)  Yes (BMP)

## 2024-01-20 NOTE — Patient Instructions (Signed)
STOP Losartan  INCREASE Entresto to 97/103 mg Twice daily  Labs done today, your results will be available in MyChart, we will contact you for abnormal readings.  Your physician has requested that you have an echocardiogram. Echocardiography is a painless test that uses sound waves to create images of your heart. It provides your doctor with information about the size and shape of your heart and how well your heart's chambers and valves are working. This procedure takes approximately one hour. There are no restrictions for this procedure. Please do NOT wear cologne, perfume, aftershave, or lotions (deodorant is allowed). Please arrive 15 minutes prior to your appointment time.  Please note: We ask at that you not bring children with you during ultrasound (echo/ vascular) testing. Due to room size and safety concerns, children are not allowed in the ultrasound rooms during exams. Our front office staff cannot provide observation of children in our lobby area while testing is being conducted. An adult accompanying a patient to their appointment will only be allowed in the ultrasound room at the discretion of the ultrasound technician under special circumstances. We apologize for any inconvenience.  Please follow up with our heart failure pharmacist in 3 weeks.  Your physician recommends that you schedule a follow-up appointment in: 3 months ( May) ** PLEASE CALL THE OFFICE IN MARCH TO ARRANGE YOUR FOLLOW UP APPOINTMENT.**  If you have any questions or concerns before your next appointment please send [place] a message through [place] or call our office at [phone].    TO LEAVE A MESSAGE FOR THE NURSE SELECT OPTION 2, PLEASE LEAVE A MESSAGE INCLUDING: YOUR NAME DATE OF BIRTH CALL BACK NUMBER REASON FOR CALL**this is important as we prioritize the call backs  YOU WILL RECEIVE A CALL BACK THE SAME DAY AS LONG AS YOU CALL BEFORE 4:00 PM  At the [facility], you and your health  needs are our priority. As part of our continuing mission to provide you with exceptional heart care, we have created designated Provider Care Teams. These Care Teams include your primary Cardiologist (physician) and Advanced Practice Providers (APPs- Physician Assistants and Nurse Practitioners) who all work together to provide you with the care you need, when you need it.   You may see any of the following providers on your designated Care Team at your next follow up: Dr Arvilla Meres Dr Marca Ancona Dr. Dorthula Nettles Dr. Clearnce Hasten Amy Filbert Schilder, NP Robbie Lis, [place] [facility] [place], [place] Brynda Peon, NP [place] Lee, NP Karle Plumber, PharmD   Please be sure to bring in all your medications bottles to every appointment.    Thank you for choosing [facility]

## 2024-01-20 NOTE — Progress Notes (Signed)
ADVANCED HF CLINIC CONSULT NOTE  PCP: Dr. Jon Billings HF Cardiologist: Dr. Gala Romney  HPI: [name] is a 58 y.o.male with HFrEF, DM2, atrial fibrillation, obesity and hypothyroidism. Typically follows with [facility] cardiology. Has had multiple admissions in the past for PAF and CHF. Has been cardioverted in the past.    [name] was recently discharged from [facility] 2/24 with a fib RVR and a/c HFpEF. Diuresed well during admission however refused to stay longer and still had volume on him at d/c. Was referred to AHF TOC.    He was seen at initial TOC appt 02/14/23 and found to be markedly volume overloaded and in atrial fibrillation with RVR. He was diuresed with IV lasix and metolazone, overall down 20 lbs. Underwent RHC showing mild pulmonary HTN with R>L heart failure with elevated filling pressures. He was placed on IV amio for afib and underwent TEE/DCCV to NSR. Drips weaned and GDMT titrated, he was discharged home weight 275 lbs.  Sleep study 4/24 showed moderate OSA, AHI 26/hr  Returns today for HF follow up. Having surgery on his left wrist 01/26/24. Weight at home stable 247-250. Breathing okay, only gets SOB with inclines, occasional lower extremity swelling. BP 150s-160s/90s-100s at home. Has been dealing with gout flares and feels that this contributes. Of note, has been taking losartan and Entresto. Only taking spiro 12.5 daily and torsemide PRN Did sleep study and was told he has OSA, but not on CPAP. Denies chest pain, palpitations, orthopnea, headaches, abnormal bleeding.   Cardiac Testing  - TEE (3/24): LVEF 55-60%, RV severely reduced, mild to mod MR, mod to severe TR, no LAA thrombus   - RHC (3/24): 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 with elevated filling pressures RA 15 PA 44/23 (33) PCW 19 CO/CI (Fick) 6.6/2.7 PVR 2.2 WU PAPi 1.4  - Echo (2/24): EF 55%, mild LVH, severe RV enlargement with moderate RV systolic dysfunction.   - [facility] at [facility] (8/[facility]): EF 45-50%, mild  LVH   - LHC (7/22):mild plaque in mid LAD, normal ramus, normal left circumflex, normal RCA. There was slightly sluggish flow, suspicious for microvascular dysfunction.    - Nuc stress test (2022):  abnormal with moderate inducible ischemia in the apical-mid inferior lateral wall segments.    - Echo (7/22): EF 55%, mild LVH, RV free wall was hypokinetic, mild MVR and TVR   - Echo (2/21): Echo EF > 65%, findings consistent with hypertrophic obstructive cardiomyopathy with subtle chordal SAM with LVOT gradient of .   - Stress echo (2019) negative for inducible ischemia   - Echo (4/19): EF 62%   - TEE (4/19): no thrombus, s/p DCCV AFL --> NSR   - Echo (9/18):  EF 50-55%, normal RV systolic function, mildly dilated LA and RA.  Past Medical History:  Diagnosis Date   (HFpEF) heart failure with preserved ejection fraction (HCC)    Acute on chronic heart failure (HCC) 05/16/2022   AKI (acute kidney injury) (HCC) 01/23/2023   Lab Results  Component  Value  Date     CREATININE  0.97  03/03/2023     CREATININE  1.14  02/19/2023     CREATININE  1.29 (H)  02/18/2023         Arthritis    knees. elbow, ankle   Cataract    CHF (congestive heart failure) (HCC)    Diabetes mellitus without complication (HCC)    Diastolic CHF (HCC) 02/14/2023   Dyspnea    when in A-fib  Dysrhythmia    A-fib   GERD (gastroesophageal reflux disease)    takes Dexilant prn   History of COVID-19 05/18/2021   Formatting of this note might be different from the original. Not requiring hospitalization Formatting of this note might be different from the original. Not requiring hospitalization   Hypertension    takes Lisinopril-HCTZ daily   Hypothyroidism    Kidney injury 03/07/2023   Chronic kidney disease, stage 3a (ICD-10: N18.31): GFR 58.20 mL/min on 07/09/2023 indicates mild kidney dysfunction. Monitor closely and adjust medications as needed.            Lab Results      Component    Value    Date/Time            CREATININE    1.35    07/09/2023 02:47 PM           CREATININE    1.20    03/12/2023 12:10 PM           CREATININE    0.97    03/03/2023 02:39 PM           CREATI   Localized swelling of left lower extremity 01/11/2020   Localized swelling of right lower extremity 04/24/2020   Muscle spasm    takes Relafen daily   OSA on CPAP 03/07/2023   does not use CPAP   Peripheral edema    Plantar fasciitis    right   Situational insomnia 01/06/2018   Sleep apnea    Sprain of deltoid ligament of left ankle 08/10/2019   Current Outpatient Medications  Medication Sig Dispense Refill   albuterol (VENTOLIN HFA) 108 (90 Base) MCG/ACT inhaler Inhale 2 puffs into the lungs every 6 (six) hours as needed for shortness of breath.     allopurinol (ZYLOPRIM) 100 MG tablet Take 0.5 tablets (50 mg total) by mouth daily. 180 tablet 1   amiodarone (PACERONE) 200 MG tablet Take 200 mg by mouth daily.     augmented betamethasone dipropionate (DIPROLENE-AF) 0.05 % cream Apply topically 2 (two) times daily as needed.     Clotrimazole 1 % OINT Apply 1 Application topically 2 (two) times a week. (Patient taking differently: Apply 1 Application topically 2 (two) times a week. As needed) 30 g 11   colchicine 0.6 MG tablet Take 1 tablet by mouth daily as needed for gout flare 30 tablet 0   Cyanocobalamin (B-12) 1000 MCG SUBL Place 1 tablet under the tongue daily at 6 (six) AM. 90 tablet 3   empagliflozin (JARDIANCE) 10 MG TABS tablet Take 1 tablet (10 mg total) by mouth daily. 90 tablet 3   erythromycin ophthalmic ointment 3 (three) times daily. As needed     fluticasone (FLONASE) 50 MCG/ACT nasal spray Place 2 sprays into both nostrils daily. (Patient taking differently: Place 2 sprays into both nostrils daily as needed for allergies.) 16 g 0   folic acid (FOLVITE) 1 MG tablet Take 1 tablet (1 mg total) by mouth daily as needed (low iron). 90 tablet 3   levothyroxine (SYNTHROID) 50 MCG tablet Take 1 tablet (50 mcg  total) by mouth daily. Needs to be adjusted every 3-6 months based on labwork if still taking amiodarone 90 tablet 3   Magnesium Bisglycinate (MAG GLYCINATE) 100 MG TABS Take 1 tablet by mouth daily. 90 tablet 3   meclizine (ANTIVERT) 25 MG tablet Take 25 mg by mouth 3 (three) times daily as needed for dizziness.     metoprolol succinate (  TOPROL-XL) 50 MG 24 hr tablet Take 1 tablet (50 mg total) by mouth daily. 90 tablet 3   Multiple Vitamins-Minerals (MULTIVITAMIN WITH MINERALS) tablet Take 1 tablet by mouth daily.     naltrexone (DEPADE) 50 MG tablet Take 0.5 tablets (25 mg total) by mouth daily. 90 tablet 1   potassium chloride (KLOR-CON) 10 MEQ tablet Take 1 tablet (10 mEq total) by mouth daily. Take with torsemide because torsemide lowers potassium 90 tablet 3   rivaroxaban (XARELTO) 20 MG TABS tablet Take 1 tablet (20 mg total) by mouth daily with supper. 90 tablet 3   rosuvastatin (CRESTOR) 20 MG tablet Take 1 tablet (20 mg total) by mouth daily. Replaces 10 mg dose, can cause cramps 90 tablet 3   sacubitril-valsartan (ENTRESTO) 97-103 MG Take 1 tablet by mouth 2 (two) times daily. 60 tablet 11   spironolactone (ALDACTONE) 25 MG tablet Take 1.5 tablets (37.5 mg total) by mouth daily. Increased dose, to allow to stop torsemide (Patient taking differently: Take 37.5 mg by mouth daily. Patient takes 0.5 tablet by mouth daily.) 90 tablet 3   torsemide (DEMADEX) 20 MG tablet TAKE 1 TABLET BY MOUTH EVERY DAY 90 tablet 1   traMADol (ULTRAM) 50 MG tablet Take 1 tablet by mouth every 8 (eight) hours as needed.     traZODone (DESYREL) 50 MG tablet Take 25 mg by mouth at bedtime as needed.     No current facility-administered medications for this encounter.   Allergies  Allergen Reactions   Iodinated Contrast Media Itching   Other Rash and Other (See Comments)    Other reaction(s): Other (See Comments)  Neoprene: Rash   Social History   Socioeconomic History   Marital status: Single     Spouse name: Not on file   Number of children: Not on file   Years of education: Not on file   Highest education level: Some college, no degree  Occupational History   Not on file  Tobacco Use   Smoking status: Never   Smokeless tobacco: Never  Vaping Use   Vaping status: Never Used  Substance and Sexual Activity   Alcohol use: Yes    Alcohol/week: 6.0 standard drinks of alcohol    Types: 6 Standard drinks or equivalent per week    Comment: drinks wine   Drug use: No   Sexual activity: Yes  Other Topics Concern   Not on file  Social History Narrative   Not on file   Social Drivers of Health   Financial Resource Strain: Medium Risk (04/04/2023)   Overall Financial Resource Strain (CARDIA)    Difficulty of Paying Living Expenses: Somewhat hard  Food Insecurity: Low Risk  (01/06/2024)   Received from [facility]   Hunger Vital Sign    Worried About Running Out of Food in the Last Year: Never true    Ran Out of Food in the Last Year: Never true  Transportation Needs: No Transportation Needs (01/06/2024)   Received from [facility]    In the past 12 months, has lack of reliable transportation kept you from medical appointments, meetings, work or from getting things needed for daily living? : No  Physical Activity: Insufficiently Active (04/04/2023)   Exercise Vital Sign    Days of Exercise per Week: 4 days    Minutes of Exercise per Session: 30 min  Stress: No Stress Concern Present (05/03/2021)   Received from [employer]   [employer] of Occupational Health -  Occupational Stress Questionnaire    Feeling of Stress : Only a little  Social Connections: Moderately Isolated (04/04/2023)   Social Connection and Isolation Panel [NHANES]    Frequency of Communication with Friends and Family: Once a week    Frequency of Social Gatherings with Friends and Family: Once a week    Attends Religious Services: Never    Database administrator or  Organizations: Yes    Attends Engineer, structural: More than 4 times per year    Marital Status: Married  Catering manager Violence: Not At Risk (02/19/2023)   Humiliation, Afraid, Rape, and Kick questionnaire    Fear of Current or Ex-Partner: No    Emotionally Abused: No    Physically Abused: No    Sexually Abused: No    No family history on file.  BP (!) 166/110   Pulse 66   Wt 113.5 kg   SpO2 97%   BMI 34.90 kg/m   Wt Readings from Last 3 Encounters:  01/20/24 113.5 kg  12/15/23 116.3 kg  11/16/23 117 kg   PHYSICAL EXAM: General:  NAD. No resp difficulty, walked into clinic HEENT: Normal Neck: Supple. No JVD. Cor: Regular rate & rhythm. No rubs, gallops or murmurs. Lungs: Clear Abdomen: Soft, nontender, nondistended.  Extremities: No cyanosis, clubbing, rash, edema Neuro: Alert & oriented x 3, moves all 4 extremities w/o difficulty. Affect pleasant.  ReDs reading: 31 %, normal  ECG (personally reviewed): NSR 67, QTc 521  ASSESSMENT & PLAN: 1. Chronic Diastolic Heart Failure  - Echo (2/24): EF 55%, mild LVH, severe RV enlargement with moderate RV systolic dysfunction. - Admitted from AHF clinic on 02/14/23 for volume overload, in setting of AF RVR. NYHA III-IIIb symptoms, ReDs 43%. - RHC 02/17/23: RA 15, PA 44/23 (33), CO 6.6, CI 2.8, PAPi 1.4. 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 with elevated filling pressures - NYHA II today, euvolemic. - Stop losartan. - Increase Entresto to 97/103 mg bid. - needs to take torsemide 20 mg daily, not PRN - Continue spiro 37.5 mg daily. - Continue Jardiance 10 mg daily. - Continue Toprol XL 50 mg daily. - BMET/BNP today.   2. Persistent Atrial fibrillation - on-going since 2019 - TEE (9/18) moderate size thrombus in the LAA, no DCCV.   - TEE (4/19) no LAA thrombus, DCCV --> NSR - Admit 2/24 with AF with RVR & 3/24. - s/p TEE/DCCV 3/24 - NSR on ECG today. - Continue amiodarone 200 mg daily. Not ideal with young  age, but suspect unlikely to hold SR w/o AAD. LFTs okay 1/25. Check TSH today. - Continue Toprol XL 50 mg daily. - Continue Xarelto 20 mg daily. CBC today.  3. HTN - BP not controlled - Med changes as above - Needs OSA treated - Consider BiDil next   4. 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  - Mild MR - TEE (3/24) showed mod/severe TR, mild to moderate MR   5. OSA - Says he's unable to tolerate CPAP mask. - Refer tback to Dr. Mayford Knife to discuss options.   6. DM2 - Controlled. - hgb A1C 6.1 (07/09/23) - Continue SGLT2i.   7. 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 - Continue Xarelto + statin.   8. Obesity - Body mass index is 34.9 kg/m. - Consider GLP1RA, will defer to PCP.  Follow up with HF  pharmacist in 2-3 weeks (BP check and GDMT titration) and 3 months with Dr. Gala Romney.  Prince Rome, FNP-BC 01/20/24

## 2024-01-21 ENCOUNTER — Telehealth (HOSPITAL_COMMUNITY): Payer: Self-pay

## 2024-01-21 NOTE — Telephone Encounter (Signed)
-----   Message from Jacklynn Ganong sent at 01/20/2024  3:29 PM EST ----- Labs stable but TSH elevated. He tells me he struggles to remember to take his levothyroxine consistently. I will forward labs to his PCP.

## 2024-01-21 NOTE — Telephone Encounter (Signed)
Spoke with patient regarding the following results. Patient made aware and patient verbalized understanding.   Patient does report that he has not been consistent with taking his levothyroxine as it is hard for him to take in the mornings before meals. Educated patient on the importance of this. Also advised that he follow up management with his PCP. Patient is aware and verbalized understanding.

## 2024-01-21 NOTE — Telephone Encounter (Addendum)
Pt aware, agreeable, and verbalized understanding Rx updated   ----- Message from Jacklynn Ganong sent at 01/21/2024  9:51 AM EST ----- Sinus rhythm personally reviewed, QTc long.   Please decrease amiodarone to 100 mg daily. Minimize use of trazadone & meclizine (as these can prolong QTc).  Repeat ECG at pharmacy follow up on 02/11/24

## 2024-01-26 ENCOUNTER — Encounter (HOSPITAL_BASED_OUTPATIENT_CLINIC_OR_DEPARTMENT_OTHER): Payer: Self-pay | Admitting: Orthopedic Surgery

## 2024-01-26 ENCOUNTER — Ambulatory Visit (HOSPITAL_BASED_OUTPATIENT_CLINIC_OR_DEPARTMENT_OTHER): Payer: PPO | Admitting: Certified Registered"

## 2024-01-26 ENCOUNTER — Other Ambulatory Visit: Payer: Self-pay

## 2024-01-26 ENCOUNTER — Ambulatory Visit (HOSPITAL_BASED_OUTPATIENT_CLINIC_OR_DEPARTMENT_OTHER)
Admission: RE | Admit: 2024-01-26 | Discharge: 2024-01-26 | Disposition: A | Discharge status: Home / Self Care | Payer: PPO | Attending: Orthopedic Surgery | Admitting: Orthopedic Surgery

## 2024-01-26 ENCOUNTER — Encounter (HOSPITAL_BASED_OUTPATIENT_CLINIC_OR_DEPARTMENT_OTHER)
Admission: RE | Disposition: A | Discharge status: Home / Self Care | Payer: Self-pay | Source: Home / Self Care | Attending: Orthopedic Surgery

## 2024-01-26 DIAGNOSIS — E039 Hypothyroidism, unspecified: Secondary | ICD-10-CM | POA: Insufficient documentation

## 2024-01-26 DIAGNOSIS — M65842 Other synovitis and tenosynovitis, left hand: Secondary | ICD-10-CM | POA: Diagnosis not present

## 2024-01-26 DIAGNOSIS — I5032 Chronic diastolic (congestive) heart failure: Secondary | ICD-10-CM | POA: Insufficient documentation

## 2024-01-26 DIAGNOSIS — I509 Heart failure, unspecified: Secondary | ICD-10-CM | POA: Diagnosis not present

## 2024-01-26 DIAGNOSIS — N1831 Chronic kidney disease, stage 3a: Secondary | ICD-10-CM | POA: Diagnosis not present

## 2024-01-26 DIAGNOSIS — E1122 Type 2 diabetes mellitus with diabetic chronic kidney disease: Secondary | ICD-10-CM | POA: Diagnosis not present

## 2024-01-26 DIAGNOSIS — K219 Gastro-esophageal reflux disease without esophagitis: Secondary | ICD-10-CM | POA: Insufficient documentation

## 2024-01-26 DIAGNOSIS — Z79899 Other long term (current) drug therapy: Secondary | ICD-10-CM | POA: Diagnosis not present

## 2024-01-26 DIAGNOSIS — G4733 Obstructive sleep apnea (adult) (pediatric): Secondary | ICD-10-CM | POA: Diagnosis not present

## 2024-01-26 DIAGNOSIS — E119 Type 2 diabetes mellitus without complications: Secondary | ICD-10-CM | POA: Diagnosis not present

## 2024-01-26 DIAGNOSIS — I130 Hypertensive heart and chronic kidney disease with heart failure and stage 1 through stage 4 chronic kidney disease, or unspecified chronic kidney disease: Secondary | ICD-10-CM | POA: Insufficient documentation

## 2024-01-26 DIAGNOSIS — Z7984 Long term (current) use of oral hypoglycemic drugs: Secondary | ICD-10-CM | POA: Diagnosis not present

## 2024-01-26 DIAGNOSIS — Z79890 Hormone replacement therapy (postmenopausal): Secondary | ICD-10-CM | POA: Diagnosis not present

## 2024-01-26 DIAGNOSIS — M65932 Unspecified synovitis and tenosynovitis, left forearm: Secondary | ICD-10-CM | POA: Diagnosis not present

## 2024-01-26 DIAGNOSIS — I5030 Unspecified diastolic (congestive) heart failure: Secondary | ICD-10-CM | POA: Diagnosis not present

## 2024-01-26 DIAGNOSIS — M65832 Other synovitis and tenosynovitis, left forearm: Secondary | ICD-10-CM | POA: Diagnosis not present

## 2024-01-26 DIAGNOSIS — I110 Hypertensive heart disease with heart failure: Secondary | ICD-10-CM | POA: Diagnosis not present

## 2024-01-26 DIAGNOSIS — I10 Essential (primary) hypertension: Secondary | ICD-10-CM

## 2024-01-26 HISTORY — PX: SYNOVECTOMY: SHX5180

## 2024-01-26 HISTORY — DX: Hypothyroidism, unspecified: E03.9

## 2024-01-26 LAB — GLUCOSE, CAPILLARY
Glucose-Capillary: 105 mg/dL — ABNORMAL HIGH (ref 70–99)
Glucose-Capillary: 91 mg/dL (ref 70–99)

## 2024-01-26 SURGERY — SYNOVECTOMY
Anesthesia: Monitor Anesthesia Care | Site: Wrist | Laterality: Left

## 2024-01-26 MED ORDER — ROPIVACAINE HCL 5 MG/ML IJ SOLN
INTRAMUSCULAR | Status: DC | PRN
  Administered 2024-01-26: 30 mL via PERINEURAL

## 2024-01-26 MED ORDER — MIDAZOLAM HCL 2 MG/2ML IJ SOLN
2.0000 mg | Freq: Once | INTRAMUSCULAR | Status: AC
  Administered 2024-01-26: 2 mg via INTRAVENOUS

## 2024-01-26 MED ORDER — AMISULPRIDE (ANTIEMETIC) 5 MG/2ML IV SOLN: 10.0000 mg | Freq: Once | INTRAVENOUS | Status: DC | PRN

## 2024-01-26 MED ORDER — FENTANYL CITRATE (PF) 100 MCG/2ML IJ SOLN
50.0000 ug | Freq: Once | INTRAMUSCULAR | Status: AC
  Administered 2024-01-26: 50 ug via INTRAVENOUS

## 2024-01-26 MED ORDER — ACETAMINOPHEN 500 MG PO TABS
ORAL_TABLET | ORAL | Status: AC
  Filled 2024-01-26: qty 2

## 2024-01-26 MED ORDER — FENTANYL CITRATE (PF) 100 MCG/2ML IJ SOLN: 25.0000 ug | INTRAMUSCULAR | Status: DC | PRN

## 2024-01-26 MED ORDER — CEFAZOLIN SODIUM-DEXTROSE 2-4 GM/100ML-% IV SOLN
2.0000 g | INTRAVENOUS | Status: AC
  Administered 2024-01-26: 2 g via INTRAVENOUS

## 2024-01-26 MED ORDER — PROPOFOL 500 MG/50ML IV EMUL
INTRAVENOUS | Status: AC
  Filled 2024-01-26: qty 50

## 2024-01-26 MED ORDER — CEFAZOLIN SODIUM 1 G IJ SOLR
INTRAMUSCULAR | Status: AC
Start: 2024-01-26 — End: ?
  Filled 2024-01-26: qty 20

## 2024-01-26 MED ORDER — BUPIVACAINE HCL (PF) 0.25 % IJ SOLN
INTRAMUSCULAR | Status: AC
  Filled 2024-01-26: qty 60

## 2024-01-26 MED ORDER — ACETAMINOPHEN 500 MG PO TABS
1000.0000 mg | ORAL_TABLET | Freq: Once | ORAL | Status: AC
  Administered 2024-01-26: 1000 mg via ORAL

## 2024-01-26 MED ORDER — LACTATED RINGERS IV SOLN: INTRAVENOUS | Status: DC

## 2024-01-26 MED ORDER — SODIUM CHLORIDE (PF) 0.9 % IJ SOLN
INTRAMUSCULAR | Status: AC
  Filled 2024-01-26: qty 20

## 2024-01-26 MED ORDER — CEFAZOLIN SODIUM-DEXTROSE 2-4 GM/100ML-% IV SOLN
INTRAVENOUS | Status: AC
  Filled 2024-01-26: qty 100

## 2024-01-26 MED ORDER — PROPOFOL 500 MG/50ML IV EMUL
INTRAVENOUS | Status: DC | PRN
  Administered 2024-01-26: 50 ug/kg/min via INTRAVENOUS
  Administered 2024-01-26: 100 ug/kg/min via INTRAVENOUS

## 2024-01-26 MED ORDER — FENTANYL CITRATE (PF) 100 MCG/2ML IJ SOLN
INTRAMUSCULAR | Status: DC | PRN
Start: 2024-01-26 — End: 2024-01-26
  Administered 2024-01-26 (×2): 25 ug via INTRAVENOUS

## 2024-01-26 MED ORDER — FENTANYL CITRATE (PF) 100 MCG/2ML IJ SOLN
INTRAMUSCULAR | Status: AC
Start: 2024-01-26 — End: ?
  Filled 2024-01-26: qty 2

## 2024-01-26 MED ORDER — 0.9 % SODIUM CHLORIDE (POUR BTL) OPTIME
TOPICAL | Status: DC | PRN
  Administered 2024-01-26: 100 mL

## 2024-01-26 MED ORDER — BUPIVACAINE-EPINEPHRINE (PF) 0.25% -1:200000 IJ SOLN
INTRAMUSCULAR | Status: AC
  Filled 2024-01-26: qty 30

## 2024-01-26 MED ORDER — MIDAZOLAM HCL 2 MG/2ML IJ SOLN
INTRAMUSCULAR | Status: AC
  Filled 2024-01-26: qty 2

## 2024-01-26 MED ORDER — HYDROCODONE-ACETAMINOPHEN 5-325 MG PO TABS: 1.0000 | ORAL_TABLET | Freq: Four times a day (QID) | ORAL | 0 refills | Status: DC | PRN

## 2024-01-26 MED ORDER — CLONIDINE HCL (ANALGESIA) 100 MCG/ML EP SOLN
EPIDURAL | Status: DC | PRN
  Administered 2024-01-26: 50 ug

## 2024-01-26 MED ORDER — SODIUM CHLORIDE 0.9 % IV SOLN: INTRAVENOUS | Status: DC | PRN

## 2024-01-26 MED ORDER — EPHEDRINE SULFATE (PRESSORS) 50 MG/ML IJ SOLN
INTRAMUSCULAR | Status: DC | PRN
  Administered 2024-01-26: 5 mg via INTRAVENOUS

## 2024-01-26 MED ORDER — FENTANYL CITRATE (PF) 100 MCG/2ML IJ SOLN
INTRAMUSCULAR | Status: AC
  Filled 2024-01-26: qty 2

## 2024-01-26 MED ORDER — PROPOFOL 10 MG/ML IV BOLUS
INTRAVENOUS | Status: DC | PRN
  Administered 2024-01-26: 30 mg via INTRAVENOUS

## 2024-01-26 SURGICAL SUPPLY — 77 items
BAG DECANTER FOR FLEXI CONT (MISCELLANEOUS) IMPLANT
BENZOIN TINCTURE PRP APPL 2/3 (GAUZE/BANDAGES/DRESSINGS) IMPLANT
BLADE MINI RND TIP GREEN BEAV (BLADE) IMPLANT
BLADE SURG 15 STRL LF DISP TIS (BLADE) ×2 IMPLANT
BNDG ELASTIC 2INX 5YD STR LF (GAUZE/BANDAGES/DRESSINGS) IMPLANT
BNDG ELASTIC 3INX 5YD STR LF (GAUZE/BANDAGES/DRESSINGS) ×1 IMPLANT
BNDG ESMARK 4X9 LF (GAUZE/BANDAGES/DRESSINGS) ×1 IMPLANT
BNDG GAUZE DERMACEA FLUFF 4 (GAUZE/BANDAGES/DRESSINGS) IMPLANT
CHLORAPREP W/TINT 26 (MISCELLANEOUS) ×1 IMPLANT
CORD BIPOLAR FORCEPS 12FT (ELECTRODE) ×1 IMPLANT
COTTONBALL LRG STERILE PKG (GAUZE/BANDAGES/DRESSINGS) IMPLANT
COVER BACK TABLE 60X90IN (DRAPES) ×1 IMPLANT
COVER MAYO STAND STRL (DRAPES) ×1 IMPLANT
CUFF TOURN SGL QUICK 18X4 (TOURNIQUET CUFF) ×1 IMPLANT
DRAPE EXTREMITY T 121X128X90 (DISPOSABLE) ×1 IMPLANT
DRAPE OEC MINIVIEW 54X84 (DRAPES) IMPLANT
DRAPE SURG 17X23 STRL (DRAPES) ×1 IMPLANT
GAUZE 4X4 16PLY ~~LOC~~+RFID DBL (SPONGE) IMPLANT
GAUZE PAD ABD 8X10 STRL (GAUZE/BANDAGES/DRESSINGS) IMPLANT
GAUZE SPONGE 4X4 12PLY STRL (GAUZE/BANDAGES/DRESSINGS) ×1 IMPLANT
GAUZE STRETCH 2X75IN STRL (MISCELLANEOUS) IMPLANT
GAUZE XEROFORM 1X8 LF (GAUZE/BANDAGES/DRESSINGS) ×1 IMPLANT
GLOVE BIO SURGEON STRL SZ 6.5 (GLOVE) IMPLANT
GLOVE BIO SURGEON STRL SZ7.5 (GLOVE) ×1 IMPLANT
GLOVE BIOGEL PI IND STRL 8 (GLOVE) ×1 IMPLANT
GLOVE INDICATOR 7.0 STRL GRN (GLOVE) IMPLANT
GLOVE INDICATOR 7.5 STRL GRN (GLOVE) IMPLANT
GLOVE INDICATOR 8.0 STRL GRN (GLOVE) IMPLANT
GLOVE SURG SS PI 7.0 STRL IVOR (GLOVE) IMPLANT
GOWN STRL REUS W/ TWL LRG LVL3 (GOWN DISPOSABLE) ×1 IMPLANT
GOWN STRL REUS W/TWL XL LVL3 (GOWN DISPOSABLE) ×1 IMPLANT
K-WIRE DBL .035X4 NSTRL (WIRE)
KWIRE DBL .035X4 NSTRL (WIRE) IMPLANT
LOOP VASCLR MAXI BLUE 18IN ST (MISCELLANEOUS) IMPLANT
NDL HYPO 22X1.5 SAFETY MO (MISCELLANEOUS) IMPLANT
NDL HYPO 25X1 1.5 SAFETY (NEEDLE) IMPLANT
NDL KEITH (NEEDLE) IMPLANT
NEEDLE HYPO 22X1.5 SAFETY MO (MISCELLANEOUS)
NEEDLE HYPO 25X1 1.5 SAFETY (NEEDLE)
NEEDLE KEITH (NEEDLE)
NS IRRIG 1000ML POUR BTL (IV SOLUTION) ×1 IMPLANT
PACK BASIN DAY SURGERY FS (CUSTOM PROCEDURE TRAY) ×1 IMPLANT
PAD CAST 3X4 CTTN HI CHSV (CAST SUPPLIES) ×1 IMPLANT
PAD CAST 4YDX4 CTTN HI CHSV (CAST SUPPLIES) IMPLANT
PADDING CAST ABS COTTON 3X4 (CAST SUPPLIES) IMPLANT
PADDING CAST ABS COTTON 4X4 ST (CAST SUPPLIES) ×1 IMPLANT
SLEEVE SCD COMPRESS KNEE MED (STOCKING) IMPLANT
SLING ARM FOAM STRAP XLG (SOFTGOODS) IMPLANT
SPIKE FLUID TRANSFER (MISCELLANEOUS) IMPLANT
SPLINT PLASTER CAST XFAST 3X15 (CAST SUPPLIES) IMPLANT
SPLINT PLASTER CAST XFAST 4X15 (CAST SUPPLIES) IMPLANT
STOCKINETTE 4X48 STRL (DRAPES) ×1 IMPLANT
STRIP CLOSURE SKIN 1/2X4 (GAUZE/BANDAGES/DRESSINGS) IMPLANT
SUT CHROMIC 5 0 P 3 (SUTURE) IMPLANT
SUT ETHIBOND 3-0 V-5 (SUTURE) IMPLANT
SUT ETHILON 3 0 PS 1 (SUTURE) IMPLANT
SUT ETHILON 4 0 PS 2 18 (SUTURE) IMPLANT
SUT FIBERWIRE 3-0 18 TAPR NDL (SUTURE)
SUT FIBERWIRE 4-0 18 DIAM BLUE (SUTURE)
SUT MERSILENE 2.0 SH NDLE (SUTURE) IMPLANT
SUT MERSILENE 4 0 P 3 (SUTURE) IMPLANT
SUT MNCRL AB 4-0 PS2 18 (SUTURE) IMPLANT
SUT PROLENE 2 0 SH DA (SUTURE) IMPLANT
SUT PROLENE 6 0 P 1 18 (SUTURE) IMPLANT
SUT SILK 2 0 PERMA HAND 18 BK (SUTURE) IMPLANT
SUT SILK 4 0 PS 2 (SUTURE) IMPLANT
SUT VIC AB 3-0 PS1 18XBRD (SUTURE) IMPLANT
SUT VIC AB 4-0 P-3 18XBRD (SUTURE) IMPLANT
SUT VIC AB 4-0 PS2 18 (SUTURE) IMPLANT
SUTURE FIBERWR 3-0 18 TAPR NDL (SUTURE) IMPLANT
SUTURE FIBERWR 4-0 18 DIA BLUE (SUTURE) IMPLANT
SYR BULB EAR ULCER 3OZ GRN STR (SYRINGE) ×1 IMPLANT
SYR CONTROL 10ML LL (SYRINGE) IMPLANT
TIE VASCULAR MAXI BLUE 18IN ST (MISCELLANEOUS)
TOWEL GREEN STERILE FF (TOWEL DISPOSABLE) ×2 IMPLANT
TUBE NG 5FR 35IN ENFIT (TUBING) IMPLANT
UNDERPAD 30X36 HEAVY ABSORB (UNDERPADS AND DIAPERS) ×1 IMPLANT

## 2024-01-26 NOTE — Discharge Instructions (Addendum)
 [facility] Center Instructions Hand Surgery  Wound Care: Keep your hand elevated above the level of your heart.  Do not allow it to dangle by your side.  Keep the dressing dry and do not remove it unless your doctor advises you to do so.  He will usually change it at the time of your post-op visit.  Moving your fingers is advised to stimulate circulation but will depend on the site of your surgery.  If you have a splint applied, your doctor will advise you regarding movement.  Activity: Do not drive or operate machinery today.  Rest today and then you may return to your normal activity and work as indicated by your physician.  Diet:  Drink liquids today or eat a light diet.  You may resume a regular diet tomorrow.    General expectations: Pain for two to three days. Fingers may become slightly swollen.  Call your doctor if any of the following occur: Severe pain not relieved by pain medication. Elevated temperature. Dressing soaked with blood. Inability to move fingers. White or bluish color to fingers.   No Tylenol before 5pm tonight.   Post Anesthesia Home Care Instructions  Activity: Get plenty of rest for the remainder of the day. A responsible individual must stay with you for 24 hours following the procedure.  For the next 24 hours, DO NOT: -Drive a car -Advertising copywriter -Drink alcoholic beverages -Take any medication unless instructed by your physician -Make any legal decisions or sign important papers.  Meals: Start with liquid foods such as gelatin or soup. Progress to regular foods as tolerated. Avoid greasy, spicy, heavy foods. If nausea and/or vomiting occur, drink only clear liquids until the nausea and/or vomiting subsides. Call your physician if vomiting continues.  Special Instructions/Symptoms: Your throat may feel dry or sore from the anesthesia or the breathing tube placed in your throat during surgery. If this causes discomfort, gargle with warm salt water. The  discomfort should disappear within 24 hours.  If you had a scopolamine patch placed behind your ear for the management of post- operative nausea and/or vomiting:  1. The medication in the patch is effective for 72 hours, after which it should be removed.  Wrap patch in a tissue and discard in the trash. Wash hands thoroughly with soap and water. 2. You may remove the patch earlier than 72 hours if you experience unpleasant side effects which may include dry mouth, dizziness or visual disturbances. 3. Avoid touching the patch. Wash your hands with soap and water after contact with the patch.   Regional Anesthesia Blocks  1. You may not be able to move or feel the "blocked" extremity after a regional anesthetic block. This may last may last from 3-48 hours after placement, but it will go away. The length of time depends on the medication injected and your individual response to the medication. As the nerves start to wake up, you may experience tingling as the movement and feeling returns to your extremity. If the numbness and inability to move your extremity has not gone away after 48 hours, please call your surgeon.   2. The extremity that is blocked will need to be protected until the numbness is gone and the strength has returned. Because you cannot feel it, you will need to take extra care to avoid injury. Because it may be weak, you may have difficulty moving it or using it. You may not know what position it is in without looking at it while  the block is in effect.  3. For blocks in the legs and feet, returning to weight bearing and walking needs to be done carefully. You will need to wait until the numbness is entirely gone and the strength has returned. You should be able to move your leg and foot normally before you try and bear weight or walk. You will need someone to be with you when you first try to ensure you do not fall and possibly risk injury.  4. Bruising and tenderness at the needle  site are common side effects and will resolve in a few days.  5. Persistent numbness or new problems with movement should be communicated to the surgeon or the [facility] [phone] [facility] [phone]).

## 2024-01-26 NOTE — H&P (Signed)
 Parker [name] is an 58 y.o. male.   Chief Complaint: extensor tenosynovitis HPI: 58 yo male with left wrist extensor tenosynovitis.  It is bothersome to him.  Had an injection which was not very helpful.  He wishes to proceed with left wrist extensor tenosynovectomy.  Allergies:  Allergies  Allergen Reactions   Iodinated Contrast Media Itching   Other Rash and Other (See Comments)    Other reaction(s): Other (See Comments)  Neoprene: Rash    Past Medical History:  Diagnosis Date   (HFpEF) heart failure with preserved ejection fraction (HCC)    Acute on chronic heart failure (HCC) 05/16/2022   AKI (acute kidney injury) (HCC) 01/23/2023   Lab Results  Component  Value  Date     CREATININE  0.97  03/03/2023     CREATININE  1.14  02/19/2023     CREATININE  1.29 (H)  02/18/2023         Arthritis    knees. elbow, ankle   Cataract    CHF (congestive heart failure) (HCC)    Diabetes mellitus without complication (HCC)    Diastolic CHF (HCC) 02/14/2023   Dyspnea    when in A-fib   Dysrhythmia    A-fib   GERD (gastroesophageal reflux disease)    takes Dexilant prn   History of COVID-19 05/18/2021   Formatting of this note might be different from the original. Not requiring hospitalization Formatting of this note might be different from the original. Not requiring hospitalization   Hypertension    takes Lisinopril-HCTZ daily   Hypothyroidism    Kidney injury 03/07/2023   Chronic kidney disease, stage 3a (ICD-10: N18.31): GFR 58.20 mL/min on 07/09/2023 indicates mild kidney dysfunction. Monitor closely and adjust medications as needed.            Lab Results      Component    Value    Date/Time           CREATININE    1.35    07/09/2023 02:47 PM           CREATININE    1.20    03/12/2023 12:10 PM           CREATININE    0.97    03/03/2023 02:39 PM           CREATI   Localized swelling of left lower extremity 01/11/2020   Localized swelling of right lower extremity 04/24/2020   Muscle  spasm    takes Relafen daily   OSA on CPAP 03/07/2023   does not use CPAP   Peripheral edema    Plantar fasciitis    right   Situational insomnia 01/06/2018   Sleep apnea    Sprain of deltoid ligament of left ankle 08/10/2019    Past Surgical History:  Procedure Laterality Date   CARDIOVERSION N/A 02/19/2023   Procedure: CARDIOVERSION;  Surgeon: Dolores Patty, MD;  Location: [facility] ENDOSCOPY;  Service: Cardiovascular;  Laterality: N/A;   EAR CYST EXCISION N/A 08/06/2013   Procedure: CYST REMOVAL;  Surgeon: Melvenia Beam, MD;  Location: [facility] OR;  Service: ENT;  Laterality: N/A;   ESOPHAGOGASTRODUODENOSCOPY     left knee arthroscopy  2003   PAROTIDECTOMY Right 08/06/2013   Procedure: PAROTIDECTOMY;  Surgeon: Melvenia Beam, MD;  Location: [facility] OR;  Service: ENT;  Laterality: Right;   RIGHT HEART CATH N/A 02/17/2023   Procedure: RIGHT HEART CATH;  Surgeon: Dolores Patty, MD;  Location: [facility] INVASIVE CV LAB;  Service: Cardiovascular;  Laterality: N/A;   TEE WITHOUT CARDIOVERSION N/A 02/19/2023   Procedure: TRANSESOPHAGEAL ECHOCARDIOGRAM (TEE);  Surgeon: Dolores Patty, MD;  Location: [facility] ENDOSCOPY;  Service: Cardiovascular;  Laterality: N/A;   TONSILLECTOMY Bilateral 08/06/2013   Procedure: TONSILLECTOMY;  Surgeon: Melvenia Beam, MD;  Location: [facility] OR;  Service: ENT;  Laterality: Bilateral;    Family History: History reviewed. No pertinent family history.  Social History:   reports that he has never smoked. He has never used smokeless tobacco. He reports current alcohol use of about 6.0 standard drinks of alcohol per week. He reports that he does not use drugs.  Medications: Medications Prior to Admission  Medication Sig Dispense Refill   allopurinol (ZYLOPRIM) 100 MG tablet Take 0.5 tablets (50 mg total) by mouth daily. 180 tablet 1   amiodarone (PACERONE) 200 MG tablet Take 100 mg by mouth daily.     augmented betamethasone dipropionate (DIPROLENE-AF) 0.05 % cream Apply  topically 2 (two) times daily as needed.     colchicine 0.6 MG tablet Take 1 tablet by mouth daily as needed for gout flare 30 tablet 0   Cyanocobalamin (B-12) 1000 MCG SUBL Place 1 tablet under the tongue daily at 6 (six) AM. 90 tablet 3   folic acid (FOLVITE) 1 MG tablet Take 1 tablet (1 mg total) by mouth daily as needed (low iron). 90 tablet 3   levothyroxine (SYNTHROID) 50 MCG tablet Take 1 tablet (50 mcg total) by mouth daily. Needs to be adjusted every 3-6 months based on labwork if still taking amiodarone 90 tablet 3   Magnesium Bisglycinate (MAG GLYCINATE) 100 MG TABS Take 1 tablet by mouth daily. 90 tablet 3   metoprolol succinate (TOPROL-XL) 50 MG 24 hr tablet Take 1 tablet (50 mg total) by mouth daily. 90 tablet 3   Multiple Vitamins-Minerals (MULTIVITAMIN WITH MINERALS) tablet Take 1 tablet by mouth daily.     naltrexone (DEPADE) 50 MG tablet Take 0.5 tablets (25 mg total) by mouth daily. 90 tablet 1   potassium chloride (KLOR-CON) 10 MEQ tablet Take 1 tablet (10 mEq total) by mouth daily. Take with torsemide because torsemide lowers potassium 90 tablet 3   rivaroxaban (XARELTO) 20 MG TABS tablet Take 1 tablet (20 mg total) by mouth daily with supper. 90 tablet 3   rosuvastatin (CRESTOR) 20 MG tablet Take 1 tablet (20 mg total) by mouth daily. Replaces 10 mg dose, can cause cramps 90 tablet 3   sacubitril-valsartan (ENTRESTO) 97-103 MG Take 1 tablet by mouth 2 (two) times daily. 60 tablet 11   spironolactone (ALDACTONE) 25 MG tablet Take 1.5 tablets (37.5 mg total) by mouth daily. Increased dose, to allow to stop torsemide (Patient taking differently: Take 37.5 mg by mouth daily. Patient takes 0.5 tablet by mouth daily.) 90 tablet 3   torsemide (DEMADEX) 20 MG tablet TAKE 1 TABLET BY MOUTH EVERY DAY 90 tablet 1   traMADol (ULTRAM) 50 MG tablet Take 1 tablet by mouth every 8 (eight) hours as needed.     traZODone (DESYREL) 50 MG tablet Take 25 mg by mouth at bedtime as needed.      albuterol (VENTOLIN HFA) 108 (90 Base) MCG/ACT inhaler Inhale 2 puffs into the lungs every 6 (six) hours as needed for shortness of breath.     Clotrimazole 1 % OINT Apply 1 Application topically 2 (two) times a week. (Patient taking differently: Apply 1 Application topically 2 (two) times a week. As needed) 30 g 11   empagliflozin (  JARDIANCE) 10 MG TABS tablet Take 1 tablet (10 mg total) by mouth daily. 90 tablet 3   erythromycin ophthalmic ointment 3 (three) times daily. As needed     fluticasone (FLONASE) 50 MCG/ACT nasal spray Place 2 sprays into both nostrils daily. (Patient taking differently: Place 2 sprays into both nostrils daily as needed for allergies.) 16 g 0   meclizine (ANTIVERT) 25 MG tablet Take 25 mg by mouth 3 (three) times daily as needed for dizziness.      Results for orders placed or performed during the hospital encounter of 01/26/24 (from the past 48 hours)  Glucose, capillary     Status: Abnormal   Collection Time: 01/26/24 11:11 AM  Result Value Ref Range   Glucose-Capillary 105 (H) 70 - 99 mg/dL    Comment: Glucose reference range applies only to samples taken after fasting for at least 8 hours.    No results found.    Blood pressure 120/75, pulse 61, temperature (!) 97.5 F (36.4 C), temperature source Temporal, resp. rate 15, height 5\' 11"  (1.803 m), weight 112.9 kg, SpO2 97%.  General appearance: alert, cooperative, and appears stated age Head: Normocephalic, without obvious abnormality, atraumatic Neck: supple, symmetrical, trachea midline Extremities: Intact sensation and capillary refill all digits.  +epl/fpl/io.  No wounds.  Skin: Skin color, texture, turgor normal. No rashes or lesions Neurologic: Grossly normal Incision/Wound: none  Assessment/Plan Left wrist extensor tenosynovitis.  Non operative and operative treatment options have been discussed with the patient and patient wishes to proceed with operative treatment. Risks, benefits, and  alternatives of surgery have been discussed and the patient agrees with the plan of care.   Betha Loa 01/26/2024, 1:18 PM

## 2024-01-26 NOTE — Op Note (Signed)
 NAME: Parker [name] MEDICAL RECORD NO: [id number] DATE OF BIRTH: 12/15/1965 FACILITY: [facility] LOCATION: [facility] PHYSICIAN: Tami Ribas, MD   OPERATIVE REPORT   DATE OF PROCEDURE: 01/26/24    PREOPERATIVE DIAGNOSIS: Left wrist fourth dorsal compartment extensor tenosynovitis   POSTOPERATIVE DIAGNOSIS: Left wrist fourth dorsal compartment extensor tenosynovitis   PROCEDURE: Left wrist fourth dorsal compartment extensor tenosynovectomy   SURGEON:  Betha Loa, M.D.   ASSISTANT: none   ANESTHESIA:  Regional with sedation   INTRAVENOUS FLUIDS:  Per anesthesia flow sheet.   ESTIMATED BLOOD LOSS:  Minimal.   COMPLICATIONS:  None.   SPECIMENS: Left wrist extensor tenosynovium to pathology   TOURNIQUET TIME:    Total Tourniquet Time Documented: Upper Arm (Left) - 43 minutes Total: Upper Arm (Left) - 43 minutes    DISPOSITION:  Stable to PACU.   INDICATIONS: 58 year old male with mass on dorsum of left hand.  It is bothersome to him.  He has had an injection without lasting relief.  He wishes to proceed with extensor tenosynovitis to me.  Risks, benefits and alternatives of surgery were discussed including the risks of blood loss, infection, damage to nerves, vessels, tendons, ligaments, bone for surgery, need for additional surgery, complications with wound healing, continued pain, stiffness, , recurrence.  He voiced understanding of these risks and elected to proceed.  OPERATIVE COURSE:  After being identified preoperatively by myself,  the patient and I agreed on the procedure and site of the procedure.  The surgical site was marked.  Surgical consent had been signed. Preoperative IV antibiotic prophylaxis was given. He was transferred to the operating room and placed on the operating table in supine position with the Left upper extremity on an arm board.  Sedation was induced by the anesthesiologist. A regional block had been performed by anesthesia in  preoperative holding.    Left upper extremity was prepped and draped in normal sterile orthopedic fashion.  A surgical pause was performed between the surgeons, anesthesia, and operating room staff and all were in agreement as to the patient, procedure, and site of procedure.  Tourniquet at the proximal aspect of the extremity was inflated to 250 mmHg after exsanguination of the arm with an Esmarch bandage.  Incision was made over the mass on the dorsum of the left hand.  This is carried in subcutaneous tissues by spreading technique.  Bipolar electrocautery was used to obtain hemostasis.  Cutaneous nerves were protected throughout the case.  The mass was fatty in nature.  It was adherent to the extensor tendons and surrounding them.  It was freed up from the extensor tendons and removed.  There was no rent in the capsule.  The extensor tendons were able to be pulled from underneath the extensor retinaculum and any tenosynovium removed.  An additional incision was then made more proximally over the distal forearm.  This was carried in subcutaneous tissues by spreading technique.  The extensor tendons to the fourth dorsal compartment were identified.  There was more inflamed tenosynovium surrounding the tendons in this area.  Again this was freed up and removed.  The tendons were placed under traction to bring them out from underneath the extensor retinaculum and all tenosynovium removed.  The tenosynovium was sent to pathology for examination.  The wounds were copiously irrigated with sterile saline.  They were closed with 4-0 nylon in a horizontal mattress fashion.  The tenosynovium was sent to pathology for examination.  The wounds were copiously irrigated  with sterile saline.  They were closed with 4-0 nylon in a horizontal mattress fashion.  There were then dressed with sterile Xeroform 4 x 4's and wrapped with a Kerlix bandage.  Volar splint was placed and wrapped with Kerlix and Ace bandage.  The tourniquet  was deflated at 43 minutes.  Fingertips were pink with brisk capillary refill after deflation of tourniquet.  The operative  drapes were broken down.  The patient was awoken from anesthesia safely.  He was transferred back to the stretcher and taken to PACU in stable condition.  I will see him back in the office in 1 week for postoperative followup.  I will give him a prescription for Norco 5/325 1-2 tabs PO q6 hours prn pain, dispense # 20.   Betha Loa, MD Electronically signed, 01/26/24

## 2024-01-26 NOTE — Anesthesia Preprocedure Evaluation (Signed)
 Anesthesia Evaluation  Patient identified by MRN, date of birth, ID band Patient awake    Reviewed: Allergy & Precautions, NPO status , Patient's Chart, lab work & pertinent test results  Airway Mallampati: II  TM Distance: >3 FB Neck ROM: Full    Dental   Pulmonary sleep apnea and Continuous Positive Airway Pressure Ventilation    Pulmonary exam normal        Cardiovascular hypertension, Pt. on medications and Pt. on home beta blockers +CHF   Rhythm:Regular Rate:Normal     Neuro/Psych  Neuromuscular disease    GI/Hepatic Neg liver ROS,GERD  ,,  Endo/Other  diabetesHypothyroidism    Renal/GU Renal disease     Musculoskeletal   Abdominal   Peds  Hematology   Anesthesia Other Findings   Reproductive/Obstetrics                             Anesthesia Physical Anesthesia Plan  ASA: 3  Anesthesia Plan: Regional and MAC   Post-op Pain Management: Tylenol PO (pre-op)*   Induction:   PONV Risk Score and Plan: 1 and Propofol infusion, Ondansetron and Treatment may vary due to age or medical condition  Airway Management Planned: Natural Airway and Simple Face Mask  Additional Equipment:   Intra-op Plan:   Post-operative Plan:   Informed Consent: I have reviewed the patients History and Physical, chart, labs and discussed the procedure including the risks, benefits and alternatives for the proposed anesthesia with the patient or authorized representative who has indicated his/her understanding and acceptance.       Plan Discussed with: CRNA  Anesthesia Plan Comments:        Anesthesia Quick Evaluation

## 2024-01-26 NOTE — Anesthesia Postprocedure Evaluation (Signed)
 Anesthesia Post Note  Patient: Parker [name]  Procedure(s) Performed: EXTENSOR TENOSYNOVECTOMY LEFT WRIST (Left: Wrist)     Patient location during evaluation: PACU Anesthesia Type: Regional Level of consciousness: awake and alert Pain management: pain level controlled Vital Signs Assessment: post-procedure vital signs reviewed and stable Respiratory status: spontaneous breathing, nonlabored ventilation, respiratory function stable and patient connected to nasal cannula oxygen Cardiovascular status: stable and blood pressure returned to baseline Postop Assessment: no apparent nausea or vomiting Anesthetic complications: no  No notable events documented.  Last Vitals:  Vitals:   01/26/24 1445 01/26/24 1453  BP: (!) 141/79 110/74  Pulse: (!) 55 (!) 52  Resp: 18 16  Temp:  (!) 36.2 C  SpO2: 95% 96%    Last Pain:  Vitals:   01/26/24 1453  TempSrc:   PainSc: 0-No pain                 Kennieth Rad

## 2024-01-26 NOTE — Transfer of Care (Signed)
 Immediate Anesthesia Transfer of Care Note  Patient: Parker [name]  Procedure(s) Performed: EXTENSOR TENOSYNOVECTOMY LEFT WRIST (Left: Wrist)  Patient Location: PACU  Anesthesia Type:MAC combined with regional for post-op pain  Level of Consciousness: drowsy  Airway & Oxygen Therapy: Patient Spontanous Breathing and Patient connected to face mask oxygen  Post-op Assessment: Report given to RN and Post -op Vital signs reviewed and stable  Post vital signs: Reviewed and stable  Last Vitals:  Vitals Value Taken Time  BP 140/73 01/26/24 1432  Temp    Pulse 60 01/26/24 1434  Resp 19 01/26/24 1434  SpO2 98 % 01/26/24 1434  Vitals shown include unfiled device data.  Last Pain:  Vitals:   01/26/24 1107  TempSrc: Temporal  PainSc: 8       Patients Stated Pain Goal: 8 (01/26/24 1107)  Complications: No notable events documented.

## 2024-01-26 NOTE — Progress Notes (Signed)
Assisted Dr. Rob Fitzgerald with left, axillary, ultrasound guided block. Side rails up, monitors on throughout procedure. See vital signs in flow sheet. Tolerated Procedure well. 

## 2024-01-26 NOTE — Anesthesia Procedure Notes (Signed)
 Anesthesia Regional Block: Axillary brachial plexus block   Pre-Anesthetic Checklist: , timeout performed,  Correct Patient, Correct Site, Correct Laterality,  Correct Procedure, Correct Position, site marked,  Risks and benefits discussed,  Surgical consent,  Pre-op evaluation,  At surgeon's request and post-op pain management  Laterality: Left  Prep: chloraprep       Needles:  Injection technique: Single-shot  Needle Type: Echogenic Stimulator Needle     Needle Length: 9cm  Needle Gauge: 21     Additional Needles:   Procedures:, nerve stimulator,,, ultrasound used (permanent image in chart),,     Nerve Stimulator or Paresthesia:  Response: MC, radial, median, and ulnar responses elicited, 0.5 mA  Additional Responses:   Narrative:  Start time: 01/26/2024 1:07 PM End time: 01/26/2024 1:14 PM Injection made incrementally with aspirations every 5 mL.  Performed by: Personally  Anesthesiologist: Marcene Duos, MD

## 2024-01-27 ENCOUNTER — Encounter (HOSPITAL_BASED_OUTPATIENT_CLINIC_OR_DEPARTMENT_OTHER): Payer: Self-pay | Admitting: Orthopedic Surgery

## 2024-01-27 LAB — SURGICAL PATHOLOGY

## 2024-02-02 ENCOUNTER — Ambulatory Visit (INDEPENDENT_AMBULATORY_CARE_PROVIDER_SITE_OTHER): Payer: PPO

## 2024-02-02 VITALS — Ht 71.0 in | Wt 248.0 lb

## 2024-02-02 DIAGNOSIS — Z1211 Encounter for screening for malignant neoplasm of colon: Secondary | ICD-10-CM

## 2024-02-02 DIAGNOSIS — Z0000 Encounter for general adult medical examination without abnormal findings: Secondary | ICD-10-CM

## 2024-02-02 DIAGNOSIS — Z1331 Encounter for screening for depression: Secondary | ICD-10-CM | POA: Diagnosis not present

## 2024-02-02 DIAGNOSIS — M65932 Unspecified synovitis and tenosynovitis, left forearm: Secondary | ICD-10-CM | POA: Diagnosis not present

## 2024-02-02 NOTE — Patient Instructions (Signed)
 [name] , Thank you for taking time to come for your Medicare Wellness Visit. I appreciate your ongoing commitment to your health goals. Please review the following plan we discussed and let me know if I can assist you in the future.   Referrals/Orders/Follow-Ups/Clinician Recommendations: Aim for 30 minutes of exercise or brisk walking, 6-8 glasses of water, and 5 servings of fruits and vegetables each day.   This is a list of the screening recommended for you and due dates:  Health Maintenance  Topic Date Due   Medicare Annual Wellness Visit  Never done   Zoster (Shingles) Vaccine (1 of 2) Never done   Colon Cancer Screening  Never done   Pneumococcal Vaccination (2 of 2 - PCV) 06/28/2022   Hemoglobin A1C  01/09/2024   Complete foot exam   04/06/2024   Eye exam for diabetics  04/15/2024   Yearly kidney health urinalysis for diabetes  04/22/2024   DTaP/Tdap/Td vaccine (2 - Td or Tdap) 08/26/2024   Yearly kidney function blood test for diabetes  01/19/2025   Flu Shot  Completed   HIV Screening  Completed   HPV Vaccine  Aged Out   COVID-19 Vaccine  Discontinued   Hepatitis C Screening  Discontinued    Advanced directives: (Declined) Advance directive discussed with you today. Even though you declined this today, please call our office should you change your mind, and we can give you the proper paperwork for you to fill out.  Next Medicare Annual Wellness Visit scheduled for next year: Yes

## 2024-02-02 NOTE — Progress Notes (Addendum)
 Subjective:   OLON RUSS is a 58 y.o. who presents for a Medicare Wellness preventive visit.  Visit Complete: Virtual I connected with  Lestine Box on 02/02/24 by a audio enabled telemedicine application and verified that I am speaking with the correct person using two identifiers.  Patient Location: Home  Provider Location: Office/Clinic  I discussed the limitations of evaluation and management by telemedicine. The patient expressed understanding and agreed to proceed.  Vital Signs: Because this visit was a virtual/telehealth visit, some criteria may be missing or patient reported. Any vitals not documented were not able to be obtained and vitals that have been documented are patient reported.  VideoDeclined- This patient declined Librarian, academic. Therefore the visit was completed with audio only.  AWV Questionnaire: No: Patient Medicare AWV questionnaire was not completed prior to this visit.  Cardiac Risk Factors include: advanced age (>64men, >41 women);diabetes mellitus;dyslipidemia;male gender;hypertension;obesity (BMI >30kg/m2)     Objective:    Today's Vitals   02/02/24 0846 02/02/24 0848  Weight:  248 lb (112.5 kg)  Height:  5\' 11"  (1.803 m)  PainSc: 8     Body mass index is 34.59 kg/m.     02/02/2024    8:54 AM 01/26/2024   11:04 AM 01/20/2024   10:46 AM 11/16/2023    9:45 AM 02/19/2023    8:25 AM 02/19/2023    5:00 AM 01/24/2023    3:35 PM  Advanced Directives  Does Patient Have a Medical Advance Directive? No No No No   No  Would patient like information on creating a medical advance directive? No - Patient declined No - Patient declined No - Patient declined         Information is confidential and restricted. Go to Review Flowsheets to unlock data.    Current Medications (verified) Outpatient Encounter Medications as of 02/02/2024  Medication Sig   albuterol (VENTOLIN HFA) 108 (90 Base) MCG/ACT inhaler Inhale 2 puffs into  the lungs every 6 (six) hours as needed for shortness of breath.   allopurinol (ZYLOPRIM) 100 MG tablet Take 0.5 tablets (50 mg total) by mouth daily.   amiodarone (PACERONE) 200 MG tablet Take 100 mg by mouth daily.   augmented betamethasone dipropionate (DIPROLENE-AF) 0.05 % cream Apply topically 2 (two) times daily as needed.   Clotrimazole 1 % OINT Apply 1 Application topically 2 (two) times a week. (Patient taking differently: Apply 1 Application topically 2 (two) times a week. As needed)   colchicine 0.6 MG tablet Take 1 tablet by mouth daily as needed for gout flare   Cyanocobalamin (B-12) 1000 MCG SUBL Place 1 tablet under the tongue daily at 6 (six) AM.   empagliflozin (JARDIANCE) 10 MG TABS tablet Take 1 tablet (10 mg total) by mouth daily.   fluticasone (FLONASE) 50 MCG/ACT nasal spray Place 2 sprays into both nostrils daily. (Patient taking differently: Place 2 sprays into both nostrils daily as needed for allergies.)   folic acid (FOLVITE) 1 MG tablet Take 1 tablet (1 mg total) by mouth daily as needed (low iron).   HYDROmorphone (DILAUDID) 2 MG tablet Take by mouth.   levothyroxine (SYNTHROID) 50 MCG tablet Take 1 tablet (50 mcg total) by mouth daily. Needs to be adjusted every 3-6 months based on labwork if still taking amiodarone   Magnesium Bisglycinate (MAG GLYCINATE) 100 MG TABS Take 1 tablet by mouth daily.   meclizine (ANTIVERT) 25 MG tablet Take 25 mg by mouth 3 (three) times daily  as needed for dizziness.   metoprolol succinate (TOPROL-XL) 50 MG 24 hr tablet Take 1 tablet (50 mg total) by mouth daily.   Multiple Vitamins-Minerals (MULTIVITAMIN WITH MINERALS) tablet Take 1 tablet by mouth daily.   naltrexone (DEPADE) 50 MG tablet Take 0.5 tablets (25 mg total) by mouth daily.   potassium chloride (KLOR-CON) 10 MEQ tablet Take 1 tablet (10 mEq total) by mouth daily. Take with torsemide because torsemide lowers potassium   rivaroxaban (XARELTO) 20 MG TABS tablet Take 1 tablet  (20 mg total) by mouth daily with supper.   sacubitril-valsartan (ENTRESTO) 97-103 MG Take 1 tablet by mouth 2 (two) times daily.   spironolactone (ALDACTONE) 25 MG tablet Take 1.5 tablets (37.5 mg total) by mouth daily. Increased dose, to allow to stop torsemide (Patient taking differently: Take 37.5 mg by mouth daily. Patient takes 0.5 tablet by mouth daily.)   torsemide (DEMADEX) 20 MG tablet TAKE 1 TABLET BY MOUTH EVERY DAY   traZODone (DESYREL) 50 MG tablet Take 25 mg by mouth at bedtime as needed.   rosuvastatin (CRESTOR) 20 MG tablet Take 1 tablet (20 mg total) by mouth daily. Replaces 10 mg dose, can cause cramps (Patient not taking: Reported on 02/02/2024)   [DISCONTINUED] erythromycin ophthalmic ointment 3 (three) times daily. As needed   [DISCONTINUED] HYDROcodone-acetaminophen (NORCO/VICODIN) 5-325 MG tablet Take 1 tablet by mouth every 6 (six) hours as needed for moderate pain (pain score 4-6).   No facility-administered encounter medications on file as of 02/02/2024.    Allergies (verified) Iodinated contrast media and Other   History: Past Medical History:  Diagnosis Date   (HFpEF) heart failure with preserved ejection fraction (HCC)    Acute on chronic heart failure (HCC) 05/16/2022   AKI (acute kidney injury) (HCC) 01/23/2023   Lab Results  Component  Value  Date     CREATININE  0.97  03/03/2023     CREATININE  1.14  02/19/2023     CREATININE  1.29 (H)  02/18/2023         Arthritis    knees. elbow, ankle   Cataract    CHF (congestive heart failure) (HCC)    Diabetes mellitus without complication (HCC)    Diastolic CHF (HCC) 02/14/2023   Dyspnea    when in A-fib   Dysrhythmia    A-fib   GERD (gastroesophageal reflux disease)    takes Dexilant prn   History of COVID-19 05/18/2021   Formatting of this note might be different from the original. Not requiring hospitalization Formatting of this note might be different from the original. Not requiring hospitalization    Hypertension    takes Lisinopril-HCTZ daily   Hypothyroidism    Kidney injury 03/07/2023   Chronic kidney disease, stage 3a (ICD-10: N18.31): GFR 58.20 mL/min on 07/09/2023 indicates mild kidney dysfunction. Monitor closely and adjust medications as needed.            Lab Results      Component    Value    Date/Time           CREATININE    1.35    07/09/2023 02:47 PM           CREATININE    1.20    03/12/2023 12:10 PM           CREATININE    0.97    03/03/2023 02:39 PM           CREATI   Localized swelling of left lower extremity 01/11/2020  Localized swelling of right lower extremity 04/24/2020   Muscle spasm    takes Relafen daily   OSA on CPAP 03/07/2023   does not use CPAP   Peripheral edema    Plantar fasciitis    right   Situational insomnia 01/06/2018   Sleep apnea    Sprain of deltoid ligament of left ankle 08/10/2019   Past Surgical History:  Procedure Laterality Date   CARDIOVERSION N/A 02/19/2023   Procedure: CARDIOVERSION;  Surgeon: Dolores Patty, MD;  Location: [facility] ENDOSCOPY;  Service: Cardiovascular;  Laterality: N/A;   EAR CYST EXCISION N/A 08/06/2013   Procedure: CYST REMOVAL;  Surgeon: Melvenia Beam, MD;  Location: [facility] OR;  Service: ENT;  Laterality: N/A;   ESOPHAGOGASTRODUODENOSCOPY     left knee arthroscopy  2003   PAROTIDECTOMY Right 08/06/2013   Procedure: PAROTIDECTOMY;  Surgeon: Melvenia Beam, MD;  Location: [facility] OR;  Service: ENT;  Laterality: Right;   RIGHT HEART CATH N/A 02/17/2023   Procedure: RIGHT HEART CATH;  Surgeon: Dolores Patty, MD;  Location: [facility] INVASIVE CV LAB;  Service: Cardiovascular;  Laterality: N/A;   SYNOVECTOMY Left 01/26/2024   Procedure: EXTENSOR TENOSYNOVECTOMY LEFT WRIST;  Surgeon: Betha Loa, MD;  Location: [facility];  Service: Orthopedics;  Laterality: Left;  Regional block   TEE WITHOUT CARDIOVERSION N/A 02/19/2023   Procedure: TRANSESOPHAGEAL ECHOCARDIOGRAM (TEE);  Surgeon: Dolores Patty, MD;  Location:  [facility] ENDOSCOPY;  Service: Cardiovascular;  Laterality: N/A;   TONSILLECTOMY Bilateral 08/06/2013   Procedure: TONSILLECTOMY;  Surgeon: Melvenia Beam, MD;  Location: [facility];  Service: ENT;  Laterality: Bilateral;   History reviewed. No pertinent family history. Social History   Socioeconomic History   Marital status: Single    Spouse name: Not on file   Number of children: Not on file   Years of education: Not on file   Highest education level: Some college, no degree  Occupational History   Not on file  Tobacco Use   Smoking status: Never   Smokeless tobacco: Never  Vaping Use   Vaping status: Never Used  Substance and Sexual Activity   Alcohol use: Yes    Alcohol/week: 6.0 standard drinks of alcohol    Types: 6 Standard drinks or equivalent per week    Comment: drinks wine   Drug use: No   Sexual activity: Yes  Other Topics Concern   Not on file  Social History Narrative   Not on file   Social Drivers of Health   Financial Resource Strain: Low Risk  (02/02/2024)   Overall Financial Resource Strain (CARDIA)    Difficulty of Paying Living Expenses: Not very hard  Food Insecurity: Food Insecurity Present (02/02/2024)   Hunger Vital Sign    Worried About Running Out of Food in the Last Year: Sometimes true    Ran Out of Food in the Last Year: Sometimes true  Transportation Needs: No Transportation Needs (02/02/2024)   PRAPARE - Administrator, Civil Service (Medical): No    Lack of Transportation (Non-Medical): No  Physical Activity: Sufficiently Active (02/02/2024)   Exercise Vital Sign    Days of Exercise per Week: 4 days    Minutes of Exercise per Session: 50 min  Stress: Stress Concern Present (02/02/2024)   [employer] of Occupational Health - Occupational Stress Questionnaire    Feeling of Stress : To some extent  Social Connections: Socially Isolated (02/02/2024)   Social Connection and Isolation Panel [NHANES]    Frequency of  Communication with Friends and  Family: More than three times a week    Frequency of Social Gatherings with Friends and Family: Three times a week    Attends Religious Services: Never    Active Member of Clubs or Organizations: No    Attends Banker Meetings: Never    Marital Status: Never married    Tobacco Counseling Counseling given: Not Answered    Clinical Intake:  Pre-visit preparation completed: Yes  Pain : 0-10 Pain Score: 8  Pain Type: Chronic pain Pain Location: Hand Pain Orientation: Left Pain Descriptors / Indicators: Aching, Sharp, Other (Comment) (swelling) Pain Onset: 1 to 4 weeks ago Pain Frequency: Constant (pt has a FOLLOW UP APPT THIS A.M)     BMI - recorded: 34.59 Nutritional Status: BMI > 30  Obese Nutritional Risks: None Diabetes: Yes CBG done?: No Did pt. bring in CBG monitor from home?: No  How often do you need to have someone help you when you read instructions, pamphlets, or other written materials from your doctor or pharmacy?: 1 - Never  Interpreter Needed?: No  Information entered by :: Lanier Ensign, LPN   Activities of Daily Living     02/02/2024    8:49 AM 01/26/2024   11:08 AM  In your present state of health, do you have any difficulty performing the following activities:  Hearing? 0 0  Vision? 0 0  Difficulty concentrating or making decisions? 0 0  Walking or climbing stairs? 1   Dressing or bathing? 1   Comment use of 1 hand at this time   Doing errands, shopping? 0   Preparing Food and eating ? Y   Comment use of 1 hand at this time   Using the Toilet? N   In the past six months, have you accidently leaked urine? N   Do you have problems with loss of bowel control? N   Managing your Medications? N   Managing your Finances? N   Housekeeping or managing your Housekeeping? Y   Comment use of 1 hand at this time     Patient Care Team: Lula Olszewski, MD as PCP - General (Internal Medicine) Quintella Reichert, MD as Consulting Physician  (Sleep Medicine) Bensimhon, Bevelyn Buckles, MD as Consulting Physician (Cardiology)  Indicate any recent Medical Services you may have received from other than [facility] providers in the past year (date may be approximate).     Assessment:   This is a routine wellness examination for Bonifacio.  Hearing/Vision screen Hearing Screening - Comments:: Pt denies any hearing issues  Vision Screening - Comments:: Pt follows up with [facility] for annual eye exams    Goals Addressed             This Visit's Progress    Patient Stated       Aim for 30 minutes of exercise or brisk walking, 6-8 glasses of water, and 5 servings of fruits and vegetables each day.        Depression Screen     02/02/2024    8:55 AM 08/11/2023    1:30 PM 07/09/2023    2:12 PM 05/29/2023   10:49 AM 03/07/2023    1:20 PM  PHQ 2/9 Scores  PHQ - 2 Score 2 2 3  0 0  PHQ- 9 Score 8 6 10       Fall Risk     02/02/2024    8:59 AM 08/11/2023    1:25 PM 05/29/2023   10:49  AM 04/07/2023    2:28 PM  Fall Risk   Falls in the past year? 1 1 0 1  Number falls in past yr: 1 0 0 0  Injury with Fall? 1 1 0 0  Comment right knee     Risk for fall due to : Impaired balance/gait;Impaired mobility;History of fall(s) No Fall Risks No Fall Risks No Fall Risks  Risk for fall due to: Comment    tripped over an object  Follow up Falls prevention discussed Falls evaluation completed Falls evaluation completed Falls evaluation completed    MEDICARE RISK AT HOME:  Medicare Risk at Home Any stairs in or around the home?: Yes If so, are there any without handrails?: No Home free of loose throw rugs in walkways, pet beds, electrical cords, etc?: Yes Adequate lighting in your home to reduce risk of falls?: Yes Life alert?: No Use of a cane, walker or w/c?: Yes (crutches as needed) Grab bars in the bathroom?: Yes Shower chair or bench in shower?: Yes Elevated toilet seat or a handicapped toilet?: No  TIMED UP AND GO:  Was the test  performed?  No  Cognitive Function: 6CIT completed        02/02/2024    9:00 AM  6CIT Screen  What Year? 0 points  What month? 0 points  What time? 0 points  Count back from 20 0 points  Months in reverse 0 points  Repeat phrase 0 points  Total Score 0 points    Immunizations Immunization History  Administered Date(s) Administered   Influenza Inj Mdck Quad Pf 08/26/2014   Influenza, Quadrivalent, Recombinant, Inj, Pf 10/30/2018, 09/30/2019   Influenza-Unspecified 08/26/2014, 10/30/2018, 09/30/2019, 10/03/2023   Pneumococcal Polysaccharide-23 06/28/2021   Tdap 08/26/2014    Screening Tests Health Maintenance  Topic Date Due   Zoster Vaccines- Shingrix (1 of 2) Never done   Colonoscopy  Never done   Pneumococcal Vaccine 62-16 Years old (2 of 2 - PCV) 06/28/2022   HEMOGLOBIN A1C  01/09/2024   FOOT EXAM  04/06/2024   OPHTHALMOLOGY EXAM  04/15/2024   Diabetic kidney evaluation - Urine ACR  04/22/2024   DTaP/Tdap/Td (2 - Td or Tdap) 08/26/2024   Diabetic kidney evaluation - eGFR measurement  01/19/2025   Medicare Annual Wellness (AWV)  02/01/2025   INFLUENZA VACCINE  Completed   HIV Screening  Completed   HPV VACCINES  Aged Out   COVID-19 Vaccine  Discontinued   Hepatitis C Screening  Discontinued    Health Maintenance  Health Maintenance Due  Topic Date Due   Zoster Vaccines- Shingrix (1 of 2) Never done   Colonoscopy  Never done   Pneumococcal Vaccine 80-36 Years old (2 of 2 - PCV) 06/28/2022   HEMOGLOBIN A1C  01/09/2024   Health Maintenance Items Addressed: See Nurse Notes  Additional Screening:  Vision Screening: Recommended annual ophthalmology exams for early detection of glaucoma and other disorders of the eye.  Dental Screening: Recommended annual dental exams for proper oral hygiene  Community Resource Referral / Chronic Care Management: CRR required this visit?  Yes   CCM required this visit?  PCP informed of CCM need      Plan:     I have  personally reviewed and noted the following in the patient's chart:   Medical and social history Use of alcohol, tobacco or illicit drugs  Current medications and supplements including opioid prescriptions. Patient is currently taking opioid prescriptions. Information provided to patient regarding non-opioid alternatives. Patient advised  to discuss non-opioid treatment plan with their provider. Functional ability and status Nutritional status Physical activity Advanced directives List of other physicians Hospitalizations, surgeries, and ER visits in previous 12 months Vitals Screenings to include cognitive, depression, and falls Referrals and appointments  In addition, I have reviewed and discussed with patient certain preventive protocols, quality metrics, and best practice recommendations. A written personalized care plan for preventive services as well as general preventive health recommendations were provided to patient.     Marzella Schlein, LPN   12/07/1094   After Visit Summary: (MyChart) Due to this being a telephonic visit, the after visit summary with patients personalized plan was offered to patient via MyChart   Notes: Please refer to Routing Comments.

## 2024-02-02 NOTE — Addendum Note (Signed)
 Addended by: Marzella Schlein on: 02/02/2024 03:34 PM   Modules accepted: Orders

## 2024-02-03 ENCOUNTER — Ambulatory Visit: Payer: PPO

## 2024-02-03 ENCOUNTER — Telehealth: Payer: Self-pay | Admitting: *Deleted

## 2024-02-03 NOTE — Progress Notes (Unsigned)
 Complex Care Management Note Care Guide Note  02/03/2024 Name: Parker [name] MRN: [id number] DOB: 1966/02/21   Complex Care Management Outreach Attempts: An unsuccessful telephone outreach was attempted today to offer the patient information about available complex care management services.  Follow Up Plan:  Additional outreach attempts will be made to offer the patient complex care management information and services.   Encounter Outcome:  No Answer  Burman Nieves, CMA, Care Guide [facility]  [facility], [facility] Guide Direct Dial: [phone]  Fax: [phone] Website: [email or web address]

## 2024-02-04 NOTE — Progress Notes (Unsigned)
 Complex Care Management Note Care Guide Note  02/04/2024 Name: Parker [name] MRN: [id number] DOB: February 25, 1966   Complex Care Management Outreach Attempts: A second unsuccessful outreach was attempted today to offer the patient with information about available complex care management services.  Follow Up Plan:  Additional outreach attempts will be made to offer the patient complex care management information and services.   Encounter Outcome:  No Answer  Burman Nieves, CMA, Care Guide [facility]  [facility], [facility] Guide Direct Dial: [phone]  Fax: [phone] Website: [email or web address]

## 2024-02-05 NOTE — Progress Notes (Signed)
 Complex Care Management Note Care Guide Note  02/05/2024 Name: Parker [name] MRN: [id number] DOB: 11/20/1966   Complex Care Management Outreach Attempts: A third unsuccessful outreach was attempted today to offer the patient with information about available complex care management services.  Follow Up Plan:  No further outreach attempts will be made at this time. We have been unable to contact the patient to offer or enroll patient in complex care management services.  Encounter Outcome:  No Answer  Burman Nieves, CMA, Care Guide [facility]  [facility], [facility] Guide Direct Dial: [phone]  Fax: [phone] Website: [email or web address]

## 2024-02-10 DIAGNOSIS — M79642 Pain in left hand: Secondary | ICD-10-CM | POA: Diagnosis not present

## 2024-02-10 DIAGNOSIS — M25532 Pain in left wrist: Secondary | ICD-10-CM | POA: Diagnosis not present

## 2024-02-10 DIAGNOSIS — M25642 Stiffness of left hand, not elsewhere classified: Secondary | ICD-10-CM | POA: Diagnosis not present

## 2024-02-10 NOTE — Progress Notes (Incomplete)
 ***In Progress***    Advanced Heart Failure Clinic Note   PCP: Dr. Jon Billings HF Cardiologist: Dr. Gala Romney   HPI: Mr. Tullier is a 58 y.o.male with HFrEF, DM2, atrial fibrillation with hx of CVA (2023), obesity and hypothyroidism. Typically follows with [facility] cardiology. Has had multiple admissions in the past for PAF and CHF. Has been cardioverted in the past.    Mr. Nocera was admitted Feb 2024 with a fib RVR and a/c HFpEF. Diuresed well during admission however refused to stay longer and still had volume on him at d/c. Was referred to AHF TOC.    He was seen at initial [facility] appt March 2024 and found to be markedly volume overloaded and in atrial fibrillation with RVR. He was diuresed with IV lasix and metolazone, overall down 20 lbs. Underwent RHC showing mild pulmonary HTN with R>L heart failure with elevated filling pressures. He was placed on IV amio for afib and underwent TEE/DCCV to NSR. Drips weaned and GDMT titrated, he was discharged home weight 275 lbs. Sleep study April 2024 showed moderate OSA, AHI 26/hr - not on CPAP.    He returned on 01/20/24 for HF follow up. Weight at home stable 247-250. Reported breathing okay, only gets SOB with inclines, occasional lower extremity swelling. BP 150s-160s/90s-100s at home. Had been dealing with gout flares and feels that this contributes. Noted to be taking both losartan and Entresto. Was only taking spironolactone 12.5 daily and torsemide PRN. Denied chest pain, palpitations, orthopnea, headaches, abnormal bleeding. Had surgery on his left wrist 01/26/24. BP was elevated in clinic to 166/110 (HR 66) - instructed to stop losartan, increase Entresto to 97/103 mg BID, and take torsemide 20 mg daily. Found to have prolonged Qtc on EKG, and was instructed to decrease amiodarone to 100 mg daily.   Today he returns to HF clinic for pharmacist medication titration. At last visit with APP clinic, he was instructed to stop losartan, increase Entresto to 97/103  mg BID, take torsemide 20 mg daily rather than PRN, decrease amiodarone to 100 mg daily, and continue all other medications. ***  Overall feeling ***. Dizziness, lightheadedness, fatigue:  Chest pain or palpitations:  How is your breathing?: *** SOB: Able to complete all ADLs. Activity level ***  Weight at home pounds. Takes furosemide/torsemide/bumex *** mg *** daily.  LEE PND/Orthopnea  Appetite *** Low-salt diet:   Physical Exam Cost/affordability of meds  Jardiance adherence - assistance? Copay check? Entresto affordability?  Cleda Daub dosing? Would not be much benefit in increasing metop 2/2 HFpEF Need BMP after increaing Entresto (did pick up higher dose)  HF Medications: Metoprolol succinate 50 mg daily ( Entresto 97-103 mg BID Spironolactone 37.5 mg (1.5 tabs) daily (filled 07/16/23 for 90ds *** was taking 0.5 tabs daily - prescribed 1 tab daily 90 tabs 90 ds) Jardiance 10 mg daily (last filled 05/04/23 for 90ds ***) Torsemide 20 mg daily  Has the patient been experiencing any side effects to the medications prescribed?  {YES NO:22349}  Does the patient have any problems obtaining medications due to transportation or finances?   {YES NO:22349}  Understanding of regimen: {excellent/good/fair/poor:19665} Understanding of indications: {excellent/good/fair/poor:19665} Potential of compliance: {excellent/good/fair/poor:19665} Patient understands to avoid NSAIDs. Patient understands to avoid decongestants.    Pertinent Lab Values: 01/20/24: Serum creatinine 1.11, BUN < 5, Potassium 3.6, Sodium 139, BNP 127  Vital Signs: Weight: *** (last clinic weight: 250.2 lbs) Blood pressure: ***  Heart rate: ***   Assessment/Plan:  1. Chronic Diastolic Heart Failure. (EF 55%  on Feb 2024 echo), due to ***. NYHA class *** symptoms. - Echo (2/24): EF 55%, mild LVH, severe RV enlargement with moderate RV systolic dysfunction. - Admitted from AHF clinic on 02/14/23 for volume overload,  in setting of AF RVR. NYHA III-IIIb symptoms, ReDs 43%. - RHC 02/17/23: RA 15, PA 44/23 (33), CO 6.6, CI 2.8, PAPi 1.4. 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 with elevated filling pressures - NYHA II today, euvolemic. - Stop losartan. - Increase Entresto to 97/103 mg bid. - needs to take torsemide 20 mg daily, not PRN - Continue spiro 37.5 mg daily. - Continue Jardiance 10 mg daily. - Continue Toprol XL 50 mg daily. - BMET/BNP today.   2. Persistent Atrial fibrillation - on-going since 2019 - TEE (9/18) moderate size thrombus in the LAA, no DCCV.   - TEE (4/19) no LAA thrombus, DCCV --> NSR - Admit 2/24 with AF with RVR & 3/24. - s/p TEE/DCCV 3/24 - NSR on ECG today. - Continue amiodarone 200 mg daily. Not ideal with young age, but suspect unlikely to hold SR w/o AAD. LFTs okay 1/25. Check TSH today. - Continue Toprol XL 50 mg daily. - Continue Xarelto 20 mg daily. CBC today.   3. HTN - BP not controlled - Med changes as above - Needs OSA treated - Consider BiDil next   4. 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  - Mild MR - TEE (3/24) showed mod/severe TR, mild to moderate MR   5. OSA - Says he's unable to tolerate CPAP mask. - Refer tback to Dr. Mayford Knife to discuss options.   6. DM2 - Controlled. - hgb A1C 6.1 (07/09/23) - Continue SGLT2i.   7. 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 - Continue Xarelto + statin.   8. Obesity - Body mass index is 34.9 kg/m. - Consider GLP1RA, will defer to PCP.   - Basic disease state pathophysiology, medication indication, mechanism and side effects reviewed at length with patient and he verbalized understanding  Follow up  Pharmacist ??*** 2 mo with [facility] (needs to be scheduled)   Karle Plumber, PharmD, BCPS, BCCP, CPP Heart Failure Clinic Pharmacist [phone]

## 2024-02-11 ENCOUNTER — Encounter (HOSPITAL_COMMUNITY): Payer: Self-pay

## 2024-02-11 ENCOUNTER — Other Ambulatory Visit (HOSPITAL_COMMUNITY): Payer: PPO

## 2024-02-11 ENCOUNTER — Ambulatory Visit (HOSPITAL_COMMUNITY)
Admission: RE | Admit: 2024-02-11 | Discharge: 2024-02-11 | Disposition: A | Discharge status: Home / Self Care | Payer: PPO | Source: Ambulatory Visit | Attending: Family Medicine | Admitting: Family Medicine

## 2024-02-11 ENCOUNTER — Other Ambulatory Visit (HOSPITAL_COMMUNITY): Payer: Self-pay

## 2024-02-11 ENCOUNTER — Ambulatory Visit (HOSPITAL_BASED_OUTPATIENT_CLINIC_OR_DEPARTMENT_OTHER)
Admission: RE | Admit: 2024-02-11 | Discharge: 2024-02-11 | Disposition: A | Discharge status: Home / Self Care | Payer: PPO | Source: Ambulatory Visit | Attending: Family Medicine

## 2024-02-11 VITALS — BP 178/118 | HR 76 | Wt 246.6 lb

## 2024-02-11 DIAGNOSIS — G4733 Obstructive sleep apnea (adult) (pediatric): Secondary | ICD-10-CM | POA: Insufficient documentation

## 2024-02-11 DIAGNOSIS — I4819 Other persistent atrial fibrillation: Secondary | ICD-10-CM | POA: Diagnosis not present

## 2024-02-11 DIAGNOSIS — Z7984 Long term (current) use of oral hypoglycemic drugs: Secondary | ICD-10-CM | POA: Insufficient documentation

## 2024-02-11 DIAGNOSIS — Z6834 Body mass index (BMI) 34.0-34.9, adult: Secondary | ICD-10-CM | POA: Insufficient documentation

## 2024-02-11 DIAGNOSIS — I130 Hypertensive heart and chronic kidney disease with heart failure and stage 1 through stage 4 chronic kidney disease, or unspecified chronic kidney disease: Secondary | ICD-10-CM | POA: Diagnosis not present

## 2024-02-11 DIAGNOSIS — Z8673 Personal history of transient ischemic attack (TIA), and cerebral infarction without residual deficits: Secondary | ICD-10-CM | POA: Diagnosis not present

## 2024-02-11 DIAGNOSIS — I5032 Chronic diastolic (congestive) heart failure: Secondary | ICD-10-CM | POA: Diagnosis not present

## 2024-02-11 DIAGNOSIS — E1142 Type 2 diabetes mellitus with diabetic polyneuropathy: Secondary | ICD-10-CM

## 2024-02-11 DIAGNOSIS — I50813 Acute on chronic right heart failure: Secondary | ICD-10-CM | POA: Diagnosis not present

## 2024-02-11 DIAGNOSIS — Z79899 Other long term (current) drug therapy: Secondary | ICD-10-CM | POA: Insufficient documentation

## 2024-02-11 DIAGNOSIS — I428 Other cardiomyopathies: Secondary | ICD-10-CM | POA: Diagnosis not present

## 2024-02-11 DIAGNOSIS — E119 Type 2 diabetes mellitus without complications: Secondary | ICD-10-CM | POA: Insufficient documentation

## 2024-02-11 DIAGNOSIS — E669 Obesity, unspecified: Secondary | ICD-10-CM | POA: Diagnosis not present

## 2024-02-11 LAB — BASIC METABOLIC PANEL
Anion gap: 11 (ref 5–15)
BUN: 5 mg/dL — ABNORMAL LOW (ref 6–20)
CO2: 23 mmol/L (ref 22–32)
Calcium: 9.1 mg/dL (ref 8.9–10.3)
Chloride: 105 mmol/L (ref 98–111)
Creatinine, Ser: 0.81 mg/dL (ref 0.61–1.24)
GFR, Estimated: >60 mL/min (ref >60)
Glucose, Bld: 107 mg/dL — ABNORMAL HIGH (ref 70–99)
Potassium: 3.9 mmol/L (ref 3.5–5.1)
Sodium: 139 mmol/L (ref 135–145)

## 2024-02-11 LAB — ECHOCARDIOGRAM COMPLETE
Area-P 1/2: 2.87 cm2
S' Lateral: 2.9 cm

## 2024-02-11 MED ORDER — SPIRONOLACTONE 25 MG PO TABS: 25.0000 mg | ORAL_TABLET | Freq: Every day | ORAL | 3 refills | Status: DC

## 2024-02-11 MED ORDER — EMPAGLIFLOZIN 10 MG PO TABS: 10.0000 mg | ORAL_TABLET | Freq: Every day | ORAL | 3 refills | Status: AC

## 2024-02-11 MED ORDER — MAG GLYCINATE 100 MG PO TABS: 1.0000 | ORAL_TABLET | Freq: Every day | ORAL | 3 refills | Status: AC

## 2024-02-11 NOTE — Patient Instructions (Signed)
 It was a pleasure seeing you today!  MEDICATIONS: -We are changing your medications today -Start Jardiance 10 mg daily -Increase spironolactone to 1 tablet (25 mg) once daily -Continue Entresto 97-103 mg TWICE DAILY -Continue torsemide 20 mg daily -Continue metoprolol succinate 50 mg daily -Call if you have questions about your medications.  LABS: -We will call you if your labs need attention.  NEXT APPOINTMENT: Return to clinic in 2 weeks with pharmacy (02/25/24 at 1PM).  In general, to take care of your heart failure: -Limit your fluid intake to 2 Liters (half-gallon) per day.   -Limit your salt intake to ideally 2-3 grams (2000-3000 mg) per day. -Weigh yourself daily and record, and bring that "weight diary" to your next appointment.  (Weight gain of 2-3 pounds in 1 day typically means fluid weight.) -The medications for your heart are to help your heart and help you live longer.   -Please contact us before stopping any of your heart medications.  Call the clinic at [phone] with questions or to reschedule future appointments.

## 2024-02-11 NOTE — Progress Notes (Signed)
 Advanced Heart Failure Clinic Note   PCP: Dr. Jon Billings HF Cardiologist: Dr. Gala Romney   HPI: Parker [name] is a 58 y.o.male with HFpEF, DM2, atrial fibrillation with hx of CVA (2023), obesity and hypothyroidism. Typically follows with [facility] cardiology. Has had multiple admissions in the past for PAF and CHF. Has been cardioverted in the past.    Parker [name] was admitted Feb 2024 with a fib RVR and a/c HFpEF. Diuresed well during admission however refused to stay longer and still had volume on him at d/c. Was referred to AHF TOC.    He was seen at initial [facility] appt March 2024 and found to be markedly volume overloaded and in atrial fibrillation with RVR. He was diuresed with IV Lasix and metolazone, overall down 20 lbs. Underwent RHC showing mild pulmonary HTN with R>L heart failure with elevated filling pressures. He was placed on IV amio for afib and underwent TEE/DCCV to NSR. Drips weaned and GDMT titrated, he was discharged home weight 275 lbs. Sleep study April 2024 showed moderate OSA, AHI 26/hr - not on CPAP.    He returned on 01/20/24 for HF follow up. Weight at home stable 247-250. Reported breathing okay, only gets SOB with inclines, occasional lower extremity swelling. BP 150s-160s/90s-100s at home. Had been dealing with gout flares and feels that this contributes. Noted to be taking both losartan and Entresto. Was only taking spironolactone 12.5 daily and torsemide PRN. Denied chest pain, palpitations, orthopnea, headaches, abnormal bleeding. Had surgery on his left wrist 01/26/24. BP was elevated in clinic to 166/110 (HR 66) - instructed to stop losartan, increase Entresto to 97/103 mg BID, and take torsemide 20 mg daily. Found to have prolonged Qtc on EKG, and was instructed to decrease amiodarone to 100 mg daily.   Today he returns to HF clinic for pharmacist medication titration. At last visit with APP clinic, he was instructed to stop losartan, increase Entresto to 97/103 mg BID, take  torsemide 20 mg daily rather than PRN, decrease amiodarone to 100 mg daily, and continue all other medications. Today he reports that he is overall feeling pretty good. Endorses dizziness or lightheadedness that occurs 1-2x/week every other week with positional changes - has issues with vertigo. He is in a lot of pain due to his recent hand surgery. Denies CP or palpitations. Reports that he does get SOB with exertion, like climbing stairs. Feels he could walk 0.5-1 mi before needing to stop to catch his breath. He is taking his weight at home a couple times per week (usually 247 lbs). He is taking torsemide 20 mg most days. No LEE on exam today- patient reports this has not been an issue for him recently. Denies PND/orthopnea. Endorses stable appetite - 1 meal/day for past 6 mo. Does notice occasional bloating and fluid retention in his abdomen. He usually cooks at home- does not add salt to foods. Reports medications have been affordable. This morning he took Entresto (97-103 mg x1 tab), metoprolol 50 mg, and amiodarone 100 mg. He is out of spironolactone. He reports he has not taken Jardiance since Nov-Dec due to reading about concerning side effects (unable to recall specific concerns). [place] Pepper is $0 on test claim today. Clinic BP today was 175/121 (during echo) followed by 180/118. Expresses that it has been difficult for him to remember twice daily medications and he was previously taking Entrsto 2 tabs once daily.  HF Medications: Metoprolol succinate 50 mg daily  Entresto 97-103 mg BID - reports he  had been taking two tablets once daily. Took 1 tablet this AM. Spironolactone 37.5 mg (1.5 tabs) daily - was taking 0.5 tabs daily, recently ran out Jardiance 10 mg daily - not taking due to concern for serious side effects, last filled 05/04/23 for 90ds Torsemide 20 mg daily - taking most days  Has the patient been experiencing any side effects to the medications prescribed?  No  Does the patient have  any problems obtaining medications due to transportation or finances?   No  Understanding of regimen: fair Understanding of indications: fair Potential of compliance: fair Patient understands to avoid NSAIDs. Patient understands to avoid decongestants.    Pertinent Lab Values: 01/20/24: Serum creatinine 1.11, BUN < 5, Potassium 3.6, Sodium 139, BNP 127 02/11/24: Serum creatinine 0.81, BUN < 5, Potassium 3.9, Sodium 139   Vital Signs: Weight: 246.6 lbs (last clinic weight: 250.2 lbs) Blood pressure: 178/118 mmHg  Heart rate: 76 bpm  Assessment/Plan:  1. Chronic Diastolic Heart Failure. (EF 55% on Feb 2024 echo), due to nonischemic cardiomyopathy, possibly hypertensive heart disease. - Echo (01/2023): EF 55%, mild LVH, severe RV enlargement with moderate RV systolic dysfunction. - Admitted from AHF clinic on 02/14/23 for volume overload, in setting of AF RVR. NYHA III-IIIb symptoms, ReDs 43%. - RHC 02/17/23: RA 15, PA 44/23 (33), CO 6.6, CI 2.8, PAPi 1.4. 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 with elevated filling pressures - Echo today (February 11 2024): EF 65-70%, mild LVH, G1DD, RV normal - NYHA II today, euvolemic. Patient requires further titration of GDMT. Scr and K WNL after increasing dose of Entresto to 97-103 mg (though patient was not taking twice daily). Appropriate to titrate spironolactone today for additional BP control. Suspect pain contributing to elevated BP today. Patient agreeable to restart Jardiance after discussion of reduced risk of HF hospitalization and potential to reduce his torsemide (+ potassium) requirements. If BP remains uncontrolled at follow-up, consider further titration of spironolactone vs addition of amlodipine.  - Continue metoprolol succinate 50 mg daily (for Afib) - Continue Entresto 97/103 mg BID - discussed importance of twice daily dosing - Increase spironolactone to 25 mg daily  - Restart Jardiance 10 mg daily ($0 copay) - BMET today   2.  Persistent Atrial fibrillation - on-going since 2019 - TEE (08/2017) moderate size thrombus in the LAA, no DCCV.   - TEE (03/2018) no LAA thrombus, DCCV --> NSR - Admit 2/24 with AF with RVR & 01/2023. - s/p TEE/DCCV 01/2023 - NSR on ECG 01/20/24. Prolonged Qtc. Will recheck at pharmacy follow-up on 02/25/24.  - Continue amiodarone 100 mg daily. Not ideal with young age, but suspect unlikely to hold SR w/o AAD. LFTs okay 1/25. TSH elevated to 9.1 on 01/20/24 - patient educated on importance of levothyroxine adherence. - Continue metoprolol XL 50 mg daily. - Continue Xarelto 20 mg daily. CBC 01/20/24 unremarkable.    3. HTN - BP uncontrolled above goal < 130/80 mmHg - Med changes as above - Needs OSA treated - Consider amlodipine next   4. 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  - Mild MR - TEE (01/2023) showed mod/severe TR, mild to moderate MR - TEE (01/2024) showed no evidence of TR or MR regurgitation   5. OSA - Unable to tolerate CPAP mask. - Referred back to Dr. Mayford Knife to discuss options.   6. DM2 - Controlled. - hgb A1C 6.1 (07/09/23) - Appropriate to restart SGLT2i   7. CVA -  pontine, midbrain and superior cerebellar infarct 07/2022 - Felt to be 2/2 to AF with poor compliance with AC. - Continue Xarelto + statin.   8. Obesity - Body mass index is 34.9 kg/m. - Consider GLP1RA, will defer to PCP.   Follow up  Pharmacist 2 weeks Dr. Gala Romney vs APP clinic in 2-3 mo (needs to be scheduled)  Nils Pyle, PharmD PGY1 Pharmacy Resident  Karle Plumber, PharmD, BCPS, BCCP, CPP Heart Failure Clinic Pharmacist [phone]

## 2024-02-11 NOTE — Progress Notes (Signed)
  Echocardiogram 2D Echocardiogram has been performed.  Parker [name] 02/11/2024, 10:47 AM

## 2024-02-23 NOTE — Progress Notes (Incomplete)
 Advanced Heart Failure Clinic Note   PCP: Dr. Jon Billings HF Cardiologist: Dr. Gala Romney   HPI: Mr. Devita is a 58 y.o.male with HFpEF, DM2, atrial fibrillation with hx of CVA (2023), obesity and hypothyroidism. Typically follows with [facility] cardiology. Has had multiple admissions in the past for PAF and CHF. Has been cardioverted in the past.    Mr. Bathgate was admitted Feb 2024 with a fib RVR and a/c HFpEF. Diuresed well during admission however refused to stay longer and still had volume on him at d/c. Was referred to AHF TOC.    He was seen at initial [facility] appt March 2024 and found to be markedly volume overloaded and in atrial fibrillation with RVR. He was diuresed with IV Lasix and metolazone, overall down 20 lbs. Underwent RHC showing mild pulmonary HTN with R>L heart failure with elevated filling pressures. He was placed on IV amio for afib and underwent TEE/DCCV to NSR. Drips weaned and GDMT titrated, he was discharged home weight 275 lbs. Sleep study April 2024 showed moderate OSA, AHI 26/hr - not on CPAP.    He returned on 01/20/24 for HF follow up. Weight at home stable 247-250. Reported breathing okay, only gets SOB with inclines, occasional lower extremity swelling. BP 150s-160s/90s-100s at home. Had been dealing with gout flares and feels that this contributes. Noted to be taking both losartan and Entresto. Was only taking spironolactone 12.5 daily and torsemide PRN. Denied chest pain, palpitations, orthopnea, headaches, abnormal bleeding. Had surgery on his left wrist 01/26/24. BP was elevated in clinic to 166/110 (HR 66) - instructed to stop losartan, increase Entresto to 97/103 mg BID, and take torsemide 20 mg daily. Found to have prolonged Qtc on EKG, and was instructed to decrease amiodarone to 100 mg daily. At pharmacy clinic on 02/11/24, patient reported doing well overall. BP was elevated to 178/118 mmHg, which may have been exacerbated by pain s/p wrist surgery. He was instructed to  restart Jardiance ($0 copay), increase spironolactone to 25 mg daily, and start taking Entresto twice daily.   Today he returns to HF clinic for pharmacist medication titration. At last visit with pharmacist, restarted Jardiance, increased spironolactone to 25 mg daily, and counseled to take Entresto twice daily.   Overall feeling ***. Dizziness, lightheadedness, fatigue:  Chest pain or palpitations:  How is your breathing?: *** SOB: Able to complete all ADLs. Activity level ***  Weight at home pounds. Takes furosemide/torsemide/bumex *** mg *** daily.  LEE PND/Orthopnea  Appetite *** Low-salt diet:   Physical Exam Cost/affordability of meds   If BP remains uncontrolled at follow-up, consider further titration of spironolactone vs addition of amlodipine.  Repeat BMP today after incr spiro Check if Jardiance, spiro, filled  Check on statin? Metop adherence? For Afib Repeat EKG today! With prolonged Qtc (trazodone, meclicizine) - after decr amio to 100 mg daily.    OLD: Today he returns to HF clinic for pharmacist medication titration. At last visit with APP clinic, he was instructed to stop losartan, increase Entresto to 97/103 mg BID, take torsemide 20 mg daily rather than PRN, decrease amiodarone to 100 mg daily, and continue all other medications. Today he reports that he is overall feeling pretty good. Endorses dizziness or lightheadedness that occurs 1-2x/week every other week with positional changes - has issues with vertigo. He is in a lot of pain due to his recent hand surgery. Denies CP or palpitations. Reports that he does get SOB with exertion, like climbing stairs. Feels he  could walk 0.5-1 mi before needing to stop to catch his breath. He is taking his weight at home a couple times per week (usually 247 lbs). He is taking torsemide 20 mg most days. No LEE on exam today- patient reports this has not been an issue for him recently. Denies PND/orthopnea. Endorses stable  appetite - 1 meal/day for past 6 mo. Does notice occasional bloating and fluid retention in his abdomen. He usually cooks at home- does not add salt to foods. Reports medications have been affordable. This morning he took Entresto (97-103 mg x1 tab), metoprolol 50 mg, and amiodarone 100 mg. He is out of spironolactone. He reports he has not taken Jardiance since Nov-Dec due to reading about concerning side effects (unable to recall specific concerns). [place] Pepper is $0 on test claim today. Clinic BP today was 175/121 (during echo) followed by 180/118. Expresses that it has been difficult for him to remember twice daily medications and he was previously taking Entresto 2 tabs once daily.  HF Medications: Metoprolol succinate 50 mg daily  Entresto 97-103 mg BID - reports he had been taking two tablets once daily. Took 1 tablet this AM. Spironolactone 37.5 mg (1.5 tabs) daily - was taking 0.5 tabs daily, recently ran out Jardiance 10 mg daily - not taking due to concern for serious side effects, last filled 05/04/23 for 90ds Torsemide 20 mg daily - taking most days  Has the patient been experiencing any side effects to the medications prescribed?  No  Does the patient have any problems obtaining medications due to transportation or finances?   No  Understanding of regimen: fair Understanding of indications: fair Potential of compliance: fair Patient understands to avoid NSAIDs. Patient understands to avoid decongestants.    Pertinent Lab Values: 01/20/24: Serum creatinine 1.11, BUN < 5, Potassium 3.6, Sodium 139, BNP 127 02/11/24: Serum creatinine 0.81, BUN < 5, Potassium 3.9, Sodium 139   Vital Signs: Weight: 246.6 lbs (last clinic weight: 250.2 lbs) Blood pressure: 178/118 mmHg  Heart rate: 76 bpm  Assessment/Plan:  1. Chronic Diastolic Heart Failure. (EF 55% on Feb 2024 echo), due to nonischemic cardiomyopathy, possibly hypertensive heart disease. - Echo (01/2023): EF 55%, mild LVH, severe  RV enlargement with moderate RV systolic dysfunction. - Admitted from AHF clinic on 02/14/23 for volume overload, in setting of AF RVR. NYHA III-IIIb symptoms, ReDs 43%. - RHC 02/17/23: RA 15, PA 44/23 (33), CO 6.6, CI 2.8, PAPi 1.4. 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 with elevated filling pressures - Echo today (February 11 2024): EF 65-70%, mild LVH, G1DD, RV normal - NYHA II today, euvolemic. Patient requires further titration of GDMT. Scr and K WNL after increasing dose of Entresto to 97-103 mg (though patient was not taking twice daily). Appropriate to titrate spironolactone today for additional BP control. Suspect pain contributing to elevated BP today. Patient agreeable to restart Jardiance after discussion of reduced risk of HF hospitalization and potential to reduce his torsemide (+ potassium) requirements. If BP remains uncontrolled at follow-up, consider further titration of spironolactone vs addition of amlodipine.  - Continue metoprolol succinate 50 mg daily (for Afib) - Continue Entresto 97/103 mg BID - discussed importance of twice daily dosing - Increase spironolactone to 25 mg daily  - Restart Jardiance 10 mg daily ($0 copay) - BMET today   2. Persistent Atrial fibrillation - on-going since 2019 - TEE (08/2017) moderate size thrombus in the LAA, no DCCV.   - TEE (03/2018) no LAA thrombus, DCCV -->  NSR - Admit 2/24 with AF with RVR & 01/2023. - s/p TEE/DCCV 01/2023 - NSR on ECG 01/20/24. Prolonged Qtc. Will recheck at pharmacy follow-up on 02/25/24.  - Continue amiodarone 100 mg daily. Not ideal with young age, but suspect unlikely to hold SR w/o AAD. LFTs okay 1/25. TSH elevated to 9.1 on 01/20/24 - patient educated on importance of levothyroxine adherence. - Continue metoprolol XL 50 mg daily. - Continue Xarelto 20 mg daily. CBC 01/20/24 unremarkable.    3. HTN - BP uncontrolled above goal < 130/80 mmHg - Med changes as above - Needs OSA treated - Consider amlodipine next   4.  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  - Mild MR - TEE (01/2023) showed mod/severe TR, mild to moderate MR - TEE (01/2024) showed no evidence of TR or MR regurgitation   5. OSA - Unable to tolerate CPAP mask. - Referred back to Dr. Mayford Knife to discuss options.   6. DM2 - Controlled. - hgb A1C 6.1 (07/09/23) - Appropriate to restart SGLT2i   7. 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 - Continue Xarelto + statin.   8. Obesity - Body mass index is 34.9 kg/m. - Consider GLP1RA, will defer to PCP.   Follow up  Pharmacist 2 weeks Dr. Gala Romney vs APP clinic in 2-3 mo (needs to be scheduled)***  Nils Pyle, PharmD PGY1 Pharmacy Resident  Karle Plumber, PharmD, BCPS, BCCP, CPP Heart Failure Clinic Pharmacist [phone]

## 2024-02-25 ENCOUNTER — Ambulatory Visit (HOSPITAL_COMMUNITY)
Admission: RE | Admit: 2024-02-25 | Discharge: 2024-02-25 | Disposition: A | Discharge status: Home / Self Care | Source: Ambulatory Visit | Attending: Cardiology | Admitting: Cardiology

## 2024-02-25 ENCOUNTER — Telehealth: Payer: Self-pay

## 2024-02-25 VITALS — BP 142/100 | HR 94 | Wt 242.2 lb

## 2024-02-25 DIAGNOSIS — Z8673 Personal history of transient ischemic attack (TIA), and cerebral infarction without residual deficits: Secondary | ICD-10-CM | POA: Diagnosis not present

## 2024-02-25 DIAGNOSIS — G4733 Obstructive sleep apnea (adult) (pediatric): Secondary | ICD-10-CM | POA: Diagnosis not present

## 2024-02-25 DIAGNOSIS — I428 Other cardiomyopathies: Secondary | ICD-10-CM | POA: Diagnosis not present

## 2024-02-25 DIAGNOSIS — I38 Endocarditis, valve unspecified: Secondary | ICD-10-CM | POA: Diagnosis not present

## 2024-02-25 DIAGNOSIS — Z7984 Long term (current) use of oral hypoglycemic drugs: Secondary | ICD-10-CM | POA: Insufficient documentation

## 2024-02-25 DIAGNOSIS — E039 Hypothyroidism, unspecified: Secondary | ICD-10-CM | POA: Diagnosis not present

## 2024-02-25 DIAGNOSIS — Z6834 Body mass index (BMI) 34.0-34.9, adult: Secondary | ICD-10-CM | POA: Insufficient documentation

## 2024-02-25 DIAGNOSIS — E119 Type 2 diabetes mellitus without complications: Secondary | ICD-10-CM | POA: Insufficient documentation

## 2024-02-25 DIAGNOSIS — E669 Obesity, unspecified: Secondary | ICD-10-CM | POA: Insufficient documentation

## 2024-02-25 DIAGNOSIS — I5032 Chronic diastolic (congestive) heart failure: Secondary | ICD-10-CM | POA: Insufficient documentation

## 2024-02-25 DIAGNOSIS — Z79899 Other long term (current) drug therapy: Secondary | ICD-10-CM | POA: Insufficient documentation

## 2024-02-25 DIAGNOSIS — I4819 Other persistent atrial fibrillation: Secondary | ICD-10-CM | POA: Insufficient documentation

## 2024-02-25 DIAGNOSIS — I110 Hypertensive heart disease with heart failure: Secondary | ICD-10-CM | POA: Insufficient documentation

## 2024-02-25 DIAGNOSIS — Z7901 Long term (current) use of anticoagulants: Secondary | ICD-10-CM | POA: Insufficient documentation

## 2024-02-25 NOTE — Patient Instructions (Signed)
 It was a pleasure seeing you today!  MEDICATIONS: -No medication changes today -Please pick up spironolactone from the pharmacy and start taking 25 mg daily. You will need to come back for labs which has been scheduled as listed on the next page.  -Please continue taking all other medications as prescribed.  -Call if you have questions about your medications.  NEXT APPOINTMENT: Return to clinic in 3 weeks for labs and 2 months with Dr. Gala Romney  In general, to take care of your heart failure: -Limit your fluid intake to 2 Liters (half-gallon) per day.   -Limit your salt intake to ideally 2-3 grams (2000-3000 mg) per day. -Weigh yourself daily and record, and bring that "weight diary" to your next appointment.  (Weight gain of 2-3 pounds in 1 day typically means fluid weight.) -The medications for your heart are to help your heart and help you live longer.   -Please contact us before stopping any of your heart medications.  Call the clinic at [phone] with questions or to reschedule future appointments.

## 2024-02-25 NOTE — Progress Notes (Signed)
 Advanced Heart Failure Clinic Note   PCP: Dr. Jon Billings HF Cardiologist: Dr. Gala Romney   HPI: Parker [name] is a 58 y.o.male with HFpEF, DM2, atrial fibrillation with hx of CVA (2023), obesity and hypothyroidism. Typically follows with [facility] cardiology. Has had multiple admissions in the past for PAF and CHF. Has been cardioverted in the past.    Parker [name] was admitted Feb 2024 with a fib RVR and a/c HFpEF. Diuresed well during admission however refused to stay longer and still had volume on him at d/c. Was referred to AHF TOC.    He was seen at initial [facility] appt March 2024 and found to be markedly volume overloaded and in atrial fibrillation with RVR. He was diuresed with IV Lasix and metolazone, overall down 20 lbs. Underwent RHC showing mild pulmonary HTN with R>L heart failure with elevated filling pressures. He was placed on IV amio for afib and underwent TEE/DCCV to NSR. Drips weaned and GDMT titrated, he was discharged home weight 275 lbs. Sleep study April 2024 showed moderate OSA, AHI 26/hr - not on CPAP.    He returned on 01/20/24 for HF follow up. Weight at home stable 247-250 lbs. Reported breathing okay, only gets SOB with inclines, occasional lower extremity swelling. BP 150s-160s/90s-100s at home. Had been dealing with gout flares and feels that this contributes. Noted to be taking both losartan and Entresto. Was only taking spironolactone 12.5 daily and torsemide PRN. Denied chest pain, palpitations, orthopnea, headaches, abnormal bleeding. Had surgery on his left wrist 01/26/24. BP was elevated in clinic to 166/110 (HR 66) - instructed to stop losartan, increase Entresto to 97/103 mg BID, and take torsemide 20 mg daily. Found to have prolonged Qtc on EKG, and was instructed to decrease amiodarone to 100 mg daily. At pharmacy clinic on 02/11/24, patient reported doing well overall. BP was elevated to 178/118 mmHg, which may have been exacerbated by pain s/p wrist surgery. He was instructed to  restart Jardiance ($0 copay), increase spironolactone to 25 mg daily, and start taking Entresto twice daily.   Today he returns to HF clinic for pharmacist medication titration. At last visit with pharmacist, restarted Jardiance, increased spironolactone to 25 mg daily, and counseled to take Entresto twice daily. Today patient reports that he restarted Jardiance from an old supply (none dispensed per fill history), but he did not refill/increase spironolactone as instructed. He reports he has been taking Entresto twice daily. He is feeling well, continues to have some baseline dizziness/lightheadedness which he feels is due to vertigo. Endorses very occasional sharp BP. Denies issues with SOB with regular activity, no change from previous. Reports his weight at home is stable - he is taking torsemide 20 mg everyday. States he missed doses of medications x1 day this week. He is planning to request delivery of spironolactone this week. Reports that he monitors his BP at home and it ranges 132-138/87-89 mmHg. He has an automatic upper arm cuff.   HF Medications: Metoprolol succinate 50 mg daily - last filled 12/15/23 for 38 day supply, patient reports he took this AM.  Sherryll Burger 97-103 mg BID  Spironolactone 25 mg daily - not taking Jardiance 10 mg daily  Torsemide 20 mg daily  Has the patient been experiencing any side effects to the medications prescribed?  No  Does the patient have any problems obtaining medications due to transportation or finances?   No; [place] [employer]  Understanding of regimen: fair Understanding of indications: fair Potential of compliance: fair Patient  understands to avoid NSAIDs. Patient understands to avoid decongestants.    Pertinent Lab Values: 01/20/24: Serum creatinine 1.11, BUN < 5, Potassium 3.6, Sodium 139, BNP 127 02/11/24: Serum creatinine 0.81, BUN < 5, Potassium 3.9, Sodium 139   Vital Signs: Weight: 242.2 lbs (last clinic weight: 246.6 lbs) Blood  pressure: 142/100 mmHg Heart rate: 94 bpm  Assessment/Plan:  1. Chronic Diastolic Heart Failure. (EF 55% on Feb 2024 echo), due to nonischemic cardiomyopathy, possibly hypertensive heart disease. - Echo (01/2023): EF 55%, mild LVH, severe RV enlargement with moderate RV systolic dysfunction. - Admitted from AHF clinic on 02/14/23 for volume overload, in setting of AF RVR. NYHA III-IIIb symptoms, ReDs 43%. - RHC 02/17/23: RA 15, PA 44/23 (33), CO 6.6, CI 2.8, PAPi 1.4. 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 with elevated filling pressures - Echo (February 11 2024): EF 65-70%, mild LVH, G1DD, RV normal - NYHA II today, euvolemic. Weight is down slightly from previous visit since restarting Jardiance and taking Entresto twice daily. Patient did not increase spironolactone as instructed, so he does not need a repeat BMET today. BP is elevated but improved from previous. Anticipate that if patient starts spironolactone, he may be able to reduce torsemide and K supplement to PRN - but it has been difficult to determine accuracy of home medication regimen. Patient was agreeable to requesting fill and starting spironolactone as previously discussed. If BP remains uncontrolled at follow-up, consider further titration of spironolactone vs addition of amlodipine.  - Continue metoprolol succinate 50 mg daily (for Afib) - Continue Entresto 97/103 mg BID - reemphasize importance of twice daily dosing - Start spironolactone 25 mg daily as previously instructed - Continue Jardiance 10 mg daily ($0 copay) - Ideally would repeat BMET in 2 weeks after starting spironolactone, however patient only agreeable to scheduling lab appointment in 3 weeks.    2. Persistent Atrial fibrillation - on-going since 2019 - TEE (08/2017) moderate size thrombus in the LAA, no DCCV.   - TEE (03/2018) no LAA thrombus, DCCV --> NSR - Admit 2/24 with AF with RVR & 01/2023. - s/p TEE/DCCV 01/2023 - NSR on ECG 01/20/24. Prolonged Qtc.  -ECG  02/25/24 with NSR and Qtc improved to 491 ms - Continue amiodarone 100 mg daily. Not ideal with young age, but suspect unlikely to hold SR w/o AAD. LFTs okay 1/25. TSH elevated to 9.1 on 01/20/24 - patient educated on importance of levothyroxine adherence. - Continue metoprolol XL 50 mg daily. - Continue Xarelto 20 mg daily. CBC 01/20/24 unremarkable.    3. HTN - BP uncontrolled above goal < 130/80 mmHg - Med changes as above - Needs OSA treated - Recommended addition of amlodipine today, but patient was not agreeable to addition of more medications at this time. Educated on importance of taking Entresto and spironolactone as prescribed.    4. 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  - Mild MR - TEE (01/2023) showed mod/severe TR, mild to moderate MR - TEE (01/2024) showed no evidence of TR or MR regurgitation   5. OSA - Unable to tolerate CPAP mask. - Referred back to Dr. Mayford Knife to discuss options.   6. DM2 - Controlled. - hgb A1C 6.1 (07/09/23)   7. 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 - Continue Xarelto + statin.   8. Obesity - Body mass index is 34.9 kg/m. - Consider GLP1RA,  will defer to PCP.  Follow up  Labs in 3 weeks Dr. Gala Romney in 2 months  Nils Pyle, PharmD PGY1 Pharmacy Resident  Karle Plumber, PharmD, BCPS, [facility], CPP Heart Failure Clinic Pharmacist [phone]

## 2024-02-25 NOTE — Telephone Encounter (Signed)
 Copied from CRM [id number]. Topic: Clinical - Medication Question >> Feb 25, 2024 12:04 PM [place] wrote: Reason for CRM: patient wanting to know if she can get a prescription for Flexeril called in for muscle spasms.    Forwarding to Dr. Jon Billings. Donzetta Starch, CMA

## 2024-02-27 ENCOUNTER — Telehealth: Payer: Self-pay

## 2024-02-27 ENCOUNTER — Other Ambulatory Visit (HOSPITAL_COMMUNITY): Payer: Self-pay | Admitting: Cardiology

## 2024-02-27 ENCOUNTER — Ambulatory Visit

## 2024-02-27 ENCOUNTER — Ambulatory Visit (INDEPENDENT_AMBULATORY_CARE_PROVIDER_SITE_OTHER): Admitting: Family

## 2024-02-27 ENCOUNTER — Encounter: Payer: Self-pay | Admitting: Family

## 2024-02-27 VITALS — BP 136/91 | HR 74 | Temp 97.2°F | Ht 71.0 in | Wt 243.2 lb

## 2024-02-27 DIAGNOSIS — M545 Low back pain, unspecified: Secondary | ICD-10-CM | POA: Diagnosis not present

## 2024-02-27 DIAGNOSIS — M549 Dorsalgia, unspecified: Secondary | ICD-10-CM | POA: Diagnosis not present

## 2024-02-27 DIAGNOSIS — I878 Other specified disorders of veins: Secondary | ICD-10-CM | POA: Diagnosis not present

## 2024-02-27 DIAGNOSIS — I50813 Acute on chronic right heart failure: Secondary | ICD-10-CM

## 2024-02-27 MED ORDER — CYCLOBENZAPRINE HCL 10 MG PO TABS: 10.0000 mg | ORAL_TABLET | Freq: Three times a day (TID) | ORAL | 0 refills | Status: AC | PRN

## 2024-02-27 MED ORDER — SPIRONOLACTONE 25 MG PO TABS
25.0000 mg | ORAL_TABLET | Freq: Every day | ORAL | 3 refills | Status: DC
Start: 2024-02-27 — End: 2024-04-30

## 2024-02-27 NOTE — Telephone Encounter (Signed)
 Copied from CRM [id number]. Topic: Clinical - Medication Question >> Feb 25, 2024 12:04 PM [place] wrote: Reason for CRM: patient wanting to know if she can get a prescription for Flexeril called in for muscle spasms.  Rx was sent in previously this am according to pt chart. Nothing further needed at this time.

## 2024-02-27 NOTE — Telephone Encounter (Signed)
 Left vm for patient to schedule an video visit or send a detailed my chart message with the reason for the flexeril.

## 2024-02-27 NOTE — Progress Notes (Signed)
 Patient ID: Parker [name], male    DOB: 1966-07-25, 58 y.o.   MRN: [id number]  Chief Complaint  Patient presents with   Back Pain    Pt c/o lower back pain for 1 week after falling. Has tried tylenol which does help, pain is achy and dull.        Discussed the use of AI scribe software for clinical note transcription with the patient, who gave verbal consent to proceed.  History of Present Illness Parker [name], a patient with a history of stroke, heart disease, and recent hand surgery, presents with persistent back pain following a fall due to vertigo. The patient reports the pain as constant and located in the lower back, on both sides of the spine, and describes it as similar to a previous kidney issue. He reports his urine is yellow in color and denies any urinary symptoms, recent BMP normal per review in chart. The pain is exacerbated by movement, particularly bending over, and is only temporarily relieved by the use of a heating pad. The patient also mentions experiencing neuropathy in the feet, but does not associate this with the current back pain. The patient has been managing the pain with over-the-counter Tylenol, but reports that this is not providing sufficient relief. The patient also mentions a significant weight loss, from 390 pounds to 242 pounds, and a recent hand surgery to remove a cyst.  Assessment & Plan Low Back Pain - Acute low back pain post-fall, likely musculoskeletal strain. Tenderness with palpation on bilateral muscular tissue either side of spine. No neuropathy or sciatica. Unlikely kidney involvement. - Ordering lumbar spine X-ray due to pain related to fall, pt understands it may take up to a week to get results, advised to go to ER if any severe pain, numbness, tingling occurs. -Sending Cyclobenzaprine 10mg  tabs, 1-2 tid prn, pt has had in past, reminded pt about SE, do not drive if impaired. - Recommend acetaminophen extended release for pain, take as directed on the  bottle. - Suggest OTC lidocaine patches for localized relief, 2-3 per day. - Pt understands to not take NSAIDs due to HF and meds. -Continue heat application up to tid for the next several days. -Call back if no improvement after another week.  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 Meclizine causes fatigue, not recently used. - Discuss meclizine benefits and side effects, ok to cut pill in half if too drowsy. - Advised on fall prevention measures. -F/U w/PCP for ongoing concerns/symptoms.   Assessment & Plan:   Subjective:    Outpatient Medications Prior to Visit  Medication Sig Dispense Refill   albuterol (VENTOLIN HFA) 108 (90 Base) MCG/ACT inhaler Inhale 2 puffs into the lungs every 6 (six) hours as needed for shortness of breath.     allopurinol (ZYLOPRIM) 100 MG tablet Take 0.5 tablets (50 mg total) by mouth daily. 180 tablet 1   amiodarone (PACERONE) 200 MG tablet Take 100 mg by mouth daily.     augmented betamethasone dipropionate (DIPROLENE-AF) 0.05 % cream Apply topically 2 (two) times daily as needed.     Clotrimazole 1 % OINT Apply 1 Application topically 2 (two) times a week. (Patient taking differently: Apply 1 Application topically 2 (two) times a week. As needed) 30 g 11   colchicine 0.6 MG tablet Take 1 tablet by mouth daily as needed for gout flare 30 tablet 0   Cyanocobalamin (B-12) 1000 MCG SUBL Place 1 tablet under the tongue daily at 6 (six)  AM. 90 tablet 3   empagliflozin (JARDIANCE) 10 MG TABS tablet Take 1 tablet (10 mg total) by mouth daily. 90 tablet 3   fluticasone (FLONASE) 50 MCG/ACT nasal spray Place 2 sprays into both nostrils daily. (Patient taking differently: Place 2 sprays into both nostrils daily as needed for allergies.) 16 g 0   folic acid (FOLVITE) 1 MG tablet Take 1 tablet (1 mg total) by mouth daily as needed (low iron). 90 tablet 3   HYDROmorphone (DILAUDID) 2 MG tablet Take by mouth.     levothyroxine (SYNTHROID)  50 MCG tablet Take 1 tablet (50 mcg total) by mouth daily. Needs to be adjusted every 3-6 months based on labwork if still taking amiodarone 90 tablet 3   Magnesium Bisglycinate (MAG GLYCINATE) 100 MG TABS Take 1 tablet by mouth daily. 90 tablet 3   meclizine (ANTIVERT) 25 MG tablet Take 25 mg by mouth 3 (three) times daily as needed for dizziness.     metoprolol succinate (TOPROL-XL) 50 MG 24 hr tablet Take 1 tablet (50 mg total) by mouth daily. 90 tablet 3   Multiple Vitamins-Minerals (MULTIVITAMIN WITH MINERALS) tablet Take 1 tablet by mouth daily.     naltrexone (DEPADE) 50 MG tablet Take 0.5 tablets (25 mg total) by mouth daily. 90 tablet 1   potassium chloride (KLOR-CON) 10 MEQ tablet Take 1 tablet (10 mEq total) by mouth daily. Take with torsemide because torsemide lowers potassium 90 tablet 3   rivaroxaban (XARELTO) 20 MG TABS tablet Take 1 tablet (20 mg total) by mouth daily with supper. 90 tablet 3   rosuvastatin (CRESTOR) 20 MG tablet Take 1 tablet (20 mg total) by mouth daily. Replaces 10 mg dose, can cause cramps 90 tablet 3   sacubitril-valsartan (ENTRESTO) 97-103 MG Take 1 tablet by mouth 2 (two) times daily. 60 tablet 11   spironolactone (ALDACTONE) 25 MG tablet Take 1 tablet (25 mg total) by mouth daily. Increased dose, to allow to stop torsemide 90 tablet 3   torsemide (DEMADEX) 20 MG tablet TAKE 1 TABLET BY MOUTH EVERY DAY 90 tablet 1   traZODone (DESYREL) 50 MG tablet Take 25 mg by mouth at bedtime as needed.     No facility-administered medications prior to visit.   Past Medical History:  Diagnosis Date   (HFpEF) heart failure with preserved ejection fraction (HCC)    Acute on chronic heart failure (HCC) 05/16/2022   AKI (acute kidney injury) (HCC) 01/23/2023   Lab Results  Component  Value  Date     CREATININE  0.97  03/03/2023     CREATININE  1.14  02/19/2023     CREATININE  1.29 (H)  02/18/2023         Arthritis    knees. elbow, ankle   Cataract    CHF (congestive  heart failure) (HCC)    Diabetes mellitus without complication (HCC)    Diastolic CHF (HCC) 02/14/2023   Dyspnea    when in A-fib   Dysrhythmia    A-fib   GERD (gastroesophageal reflux disease)    takes Dexilant prn   History of COVID-19 05/18/2021   Formatting of this note might be different from the original. Not requiring hospitalization Formatting of this note might be different from the original. Not requiring hospitalization   Hypertension    takes Lisinopril-HCTZ daily   Hypothyroidism    Kidney injury 03/07/2023   Chronic kidney disease, stage 3a (ICD-10: N18.31): GFR 58.20 mL/min on 07/09/2023 indicates mild kidney dysfunction. Monitor  closely and adjust medications as needed.            Lab Results      Component    Value    Date/Time           CREATININE    1.35    07/09/2023 02:47 PM           CREATININE    1.20    03/12/2023 12:10 PM           CREATININE    0.97    03/03/2023 02:39 PM           CREATI   Localized swelling of left lower extremity 01/11/2020   Localized swelling of right lower extremity 04/24/2020   Muscle spasm    takes Relafen daily   OSA on CPAP 03/07/2023   does not use CPAP   Peripheral edema    Plantar fasciitis    right   Situational insomnia 01/06/2018   Sleep apnea    Sprain of deltoid ligament of left ankle 08/10/2019   Past Surgical History:  Procedure Laterality Date   CARDIOVERSION N/A 02/19/2023   Procedure: CARDIOVERSION;  Surgeon: Dolores Patty, MD;  Location[facility] ENDOSCOPY;  Service: Cardiovascular;  Laterality: N/A;   EAR CYST EXCISION N/A 08/06/2013   Procedure: CYST REMOVAL;  Surgeon: Melvenia Beam, MD;  Location: [facility] OR;  Service: ENT;  Laterality: N/A;   ESOPHAGOGASTRODUODENOSCOPY     left knee arthroscopy  2003   PAROTIDECTOMY Right 08/06/2013   Procedure: PAROTIDECTOMY;  Surgeon: Melvenia Beam, MD;  Location: [facility] OR;  Service: ENT;  Laterality: Right;   RIGHT HEART CATH N/A 02/17/2023   Procedure: RIGHT HEART CATH;   Surgeon: Dolores Patty, MD;  Location: [facility] INVASIVE CV LAB;  Service: Cardiovascular;  Laterality: N/A;   SYNOVECTOMY Left 01/26/2024   Procedure: EXTENSOR TENOSYNOVECTOMY LEFT WRIST;  Surgeon: Betha Loa, MD;  Location: [facility];  Service: Orthopedics;  Laterality: Left;  Regional block   TEE WITHOUT CARDIOVERSION N/A 02/19/2023   Procedure: TRANSESOPHAGEAL ECHOCARDIOGRAM (TEE);  Surgeon: Dolores Patty, MD;  Location: [facility] ENDOSCOPY;  Service: Cardiovascular;  Laterality: N/A;   TONSILLECTOMY Bilateral 08/06/2013   Procedure: TONSILLECTOMY;  Surgeon: Melvenia Beam, MD;  Location: [facility] OR;  Service: ENT;  Laterality: Bilateral;   Allergies  Allergen Reactions   Iodinated Contrast Media Itching   Other Rash and Other (See Comments)    Other reaction(s): Other (See Comments)  Neoprene: Rash      Objective:    Physical Exam Vitals and nursing note reviewed.  Constitutional:      General: He is not in acute distress.    Appearance: Normal appearance.  HENT:     Head: Normocephalic.  Cardiovascular:     Rate and Rhythm: Normal rate and regular rhythm.  Pulmonary:     Effort: Pulmonary effort is normal.     Breath sounds: Normal breath sounds.  Musculoskeletal:     Cervical back: Normal range of motion.     Lumbar back: Spasms and tenderness present. No swelling, signs of trauma or bony tenderness. Decreased range of motion.  Skin:    General: Skin is warm and dry.  Neurological:     Mental Status: He is alert and oriented to person, place, and time.  Psychiatric:        Mood and Affect: Mood normal.    BP (!) 136/91 (BP Location: Left Arm, Patient Position: Sitting, Cuff Size: Large)   Pulse 74  Temp (!) 97.2 F (36.2 C) (Temporal)   Ht 5\' 11"  (1.803 m)   Wt 243 lb 3.2 oz (110.3 kg)   SpO2 98%   BMI 33.92 kg/m  Wt Readings from Last 3 Encounters:  02/27/24 243 lb 3.2 oz (110.3 kg)  02/25/24 242 lb 3.2 oz (109.9 kg)  02/11/24 246 lb 9.6 oz  (111.9 kg)       Dulce Sellar, NP

## 2024-02-27 NOTE — Patient Instructions (Addendum)
 It was very nice to see you today!   Look for generic Tylenol extended release 650mg  or arthritis strength which also 650mg . Look for Lidocaine patches, over the counter, this can help provide pain relief. Keep applying heat for 20-60min several times to help with pain and helps with healing. I am also sending over 10mg  Flexeril - take 1 during the day and 2 at night due to drowsiness.      PLEASE NOTE:  If you had any lab tests please let us know if you have not heard back within a few days. You may see your results on MyChart before we have a chance to review them but we will give you a call once they are reviewed by [place]. If we ordered any referrals today, please let us know if you have not heard from their office within the next week.

## 2024-02-29 ENCOUNTER — Encounter: Payer: Self-pay | Admitting: Family

## 2024-03-01 ENCOUNTER — Ambulatory Visit: Admitting: Sports Medicine

## 2024-03-01 VITALS — BP 138/88 | HR 80 | Ht 71.0 in | Wt 243.0 lb

## 2024-03-01 DIAGNOSIS — S32020A Wedge compression fracture of second lumbar vertebra, initial encounter for closed fracture: Secondary | ICD-10-CM | POA: Diagnosis not present

## 2024-03-01 DIAGNOSIS — G8929 Other chronic pain: Secondary | ICD-10-CM

## 2024-03-01 DIAGNOSIS — M545 Low back pain, unspecified: Secondary | ICD-10-CM

## 2024-03-01 NOTE — Patient Instructions (Signed)
 No lifting more than 15 pounds  Tylenol [phone] mg 2-3 times a day for pain relief  You can use tylenol PM at night instead of the 3rd tylenol dose if no improvement in 2-3 days call and ask for a prednisone dos pak 1 week follow up for repeat xray

## 2024-03-01 NOTE — Progress Notes (Signed)
 Parker [name] D.[facility] [address] [place] [address] Phone: [phone]   Assessment and Plan:     1. Closed compression fracture of L2 vertebra, initial encounter (HCC) (Primary) 2. Chronic bilateral low back pain without sciatica -Chronic with exacerbation, initial sports medicine visit - Recurrent flare of bilateral low back pain.  Patient had fall on 02/20/2024 with pain starting around 02/22/2024.  30% compression deformity of superior endplate of L2 seen on x-ray imaging on 02/27/2024.  No prior imaging, so currently unsure if this represents an acute/subacute/chronic finding - We will treat current finding as acute based on localized TTP at L2 on physical exam.  Recommend no lifting >15 pounds.  Limit physical activity until reevaluation - Will repeat lumbar x-ray in 1 week to see if there is any progression in compression deformity which would lead [place] to believe that fracture is truly acute - Do not recommend NSAIDs with patient on chronic anticoagulation with Xarelto - Start Tylenol 500 to 1000 mg tablets 2-3 times a day for day-to-day pain relief.  Could use Tylenol PM for third dose instead of Tylenol [phone] mg - If no significant improvement in 2 to 3 days, patient can call and ask for prednisone Dosepak with the understanding that prednisone can raise blood pressure, blood glucose, heart rate temporarily    Pertinent previous records reviewed include family medicine note 02/27/2024  Follow Up: 1 week for reevaluation and repeat lumbar x-ray.  If pain is not controlled, could consider prednisone course versus intranasal calcitonin if acute vertebral fracture is suspected.  Could consider advanced lumbar imaging.  If pain is improving, could consider HEP, physical therapy   Subjective:   I, Parker [name], am serving as a Neurosurgeon for Doctor Richardean Sale  Chief Complaint: low back pain   HPI:   03/01/24 Patient is a 58 year  old male with low back pain. Patient states pain started about a week ago. No MOI. Flexeril is not helping. Has some numbness and tingling. Decreased ROM . Isnt able to sleep through the night. No radiating pain.    Relevant Historical Information: Hypertension, atrial fibrillation, HFpEF, OSA, NAFLD, DM type II  Additional pertinent review of systems negative.   Current Outpatient Medications:    albuterol (VENTOLIN HFA) 108 (90 Base) MCG/ACT inhaler, Inhale 2 puffs into the lungs every 6 (six) hours as needed for shortness of breath., Disp: , Rfl:    allopurinol (ZYLOPRIM) 100 MG tablet, Take 0.5 tablets (50 mg total) by mouth daily., Disp: 180 tablet, Rfl: 1   amiodarone (PACERONE) 200 MG tablet, Take 100 mg by mouth daily., Disp: , Rfl:    augmented betamethasone dipropionate (DIPROLENE-AF) 0.05 % cream, Apply topically 2 (two) times daily as needed., Disp: , Rfl:    Clotrimazole 1 % OINT, Apply 1 Application topically 2 (two) times a week. (Patient taking differently: Apply 1 Application topically 2 (two) times a week. As needed), Disp: 30 g, Rfl: 11   colchicine 0.6 MG tablet, Take 1 tablet by mouth daily as needed for gout flare, Disp: 30 tablet, Rfl: 0   Cyanocobalamin (B-12) 1000 MCG SUBL, Place 1 tablet under the tongue daily at 6 (six) AM., Disp: 90 tablet, Rfl: 3   cyclobenzaprine (FLEXERIL) 10 MG tablet, Take 1-2 tablets (10-20 mg total) by mouth 3 (three) times daily as needed for muscle spasms., Disp: 30 tablet, Rfl: 0   empagliflozin (JARDIANCE) 10 MG TABS tablet, Take 1 tablet (10 mg  total) by mouth daily., Disp: 90 tablet, Rfl: 3   fluticasone (FLONASE) 50 MCG/ACT nasal spray, Place 2 sprays into both nostrils daily. (Patient taking differently: Place 2 sprays into both nostrils daily as needed for allergies.), Disp: 16 g, Rfl: 0   folic acid (FOLVITE) 1 MG tablet, Take 1 tablet (1 mg total) by mouth daily as needed (low iron)., Disp: 90 tablet, Rfl: 3   HYDROmorphone (DILAUDID)  2 MG tablet, Take by mouth., Disp: , Rfl:    levothyroxine (SYNTHROID) 50 MCG tablet, Take 1 tablet (50 mcg total) by mouth daily. Needs to be adjusted every 3-6 months based on labwork if still taking amiodarone, Disp: 90 tablet, Rfl: 3   Magnesium Bisglycinate (MAG GLYCINATE) 100 MG TABS, Take 1 tablet by mouth daily., Disp: 90 tablet, Rfl: 3   meclizine (ANTIVERT) 25 MG tablet, Take 25 mg by mouth 3 (three) times daily as needed for dizziness., Disp: , Rfl:    metoprolol succinate (TOPROL-XL) 50 MG 24 hr tablet, Take 1 tablet (50 mg total) by mouth daily., Disp: 90 tablet, Rfl: 3   Multiple Vitamins-Minerals (MULTIVITAMIN WITH MINERALS) tablet, Take 1 tablet by mouth daily., Disp: , Rfl:    naltrexone (DEPADE) 50 MG tablet, Take 0.5 tablets (25 mg total) by mouth daily., Disp: 90 tablet, Rfl: 1   potassium chloride (KLOR-CON) 10 MEQ tablet, Take 1 tablet (10 mEq total) by mouth daily. Take with torsemide because torsemide lowers potassium, Disp: 90 tablet, Rfl: 3   rivaroxaban (XARELTO) 20 MG TABS tablet, Take 1 tablet (20 mg total) by mouth daily with supper., Disp: 90 tablet, Rfl: 3   rosuvastatin (CRESTOR) 20 MG tablet, Take 1 tablet (20 mg total) by mouth daily. Replaces 10 mg dose, can cause cramps, Disp: 90 tablet, Rfl: 3   sacubitril-valsartan (ENTRESTO) 97-103 MG, Take 1 tablet by mouth 2 (two) times daily., Disp: 60 tablet, Rfl: 11   spironolactone (ALDACTONE) 25 MG tablet, Take 1 tablet (25 mg total) by mouth daily. Increased dose, to allow to stop torsemide, Disp: 90 tablet, Rfl: 3   torsemide (DEMADEX) 20 MG tablet, TAKE 1 TABLET BY MOUTH EVERY DAY, Disp: 90 tablet, Rfl: 1   traZODone (DESYREL) 50 MG tablet, Take 25 mg by mouth at bedtime as needed., Disp: , Rfl:    Objective:     Vitals:   03/01/24 1418  BP: 138/88  Pulse: 80  SpO2: 98%  Weight: 243 lb (110.2 kg)  Height: 5\' 11"  (1.803 m)      Body mass index is 33.89 kg/m.    Physical Exam:    Gen: Appears well, nad,  nontoxic and pleasant Psych: Alert and oriented, appropriate mood and affect Neuro: sensation intact, strength is 5/5 in upper and lower extremities, muscle tone wnl Skin: no susupicious lesions or rashes  Back - Normal skin, Spine with normal alignment and no deformity.     tenderness to L2, L4-L5 vertebral process palpation.   Bilateral lumbar paraspinous muscles are   tender and without spasm NTTP gluteal musculature Straight leg raise negative Piriformis Test negative Gait normal  Mild bilateral pain with lumbar flexion.  Moderate pain with lumbar extension.  No pain with rotation  Electronically signed by:  Parker [name] D.[facility] 2:46 PM 03/01/24

## 2024-03-17 ENCOUNTER — Other Ambulatory Visit (HOSPITAL_COMMUNITY)

## 2024-03-26 NOTE — Progress Notes (Deleted)
 error

## 2024-03-29 ENCOUNTER — Encounter: Admitting: Sports Medicine

## 2024-03-30 NOTE — Progress Notes (Signed)
 This encounter was created in error - please disregard.

## 2024-04-01 NOTE — Progress Notes (Deleted)
 Ben Jackson D.[facility] [address] [place] [address] Phone: [phone]   Assessment and Plan:     There are no diagnoses linked to this encounter.  ***   Pertinent previous records reviewed include ***    Follow Up: ***     Subjective:   I, Parker [name], am serving as a Neurosurgeon for Doctor Ulysees Gander   Chief Complaint: low back pain    HPI:    03/01/24 Patient is a 58 year old male with low back pain. Patient states pain started about a week ago. No MOI. Flexeril  is not helping. Has some numbness and tingling. Decreased ROM . Isnt able to sleep through the night. No radiating pain.    04/02/2024 Patient states   Relevant Historical Information: Hypertension, atrial fibrillation, HFpEF, OSA, NAFLD, DM type II  Additional pertinent review of systems negative.   Current Outpatient Medications:    albuterol  (VENTOLIN  HFA) 108 (90 Base) MCG/ACT inhaler, Inhale 2 puffs into the lungs every 6 (six) hours as needed for shortness of breath., Disp: , Rfl:    allopurinol  (ZYLOPRIM ) 100 MG tablet, Take 0.5 tablets (50 mg total) by mouth daily., Disp: 180 tablet, Rfl: 1   amiodarone  (PACERONE ) 200 MG tablet, Take 100 mg by mouth daily., Disp: , Rfl:    augmented betamethasone dipropionate (DIPROLENE-AF) 0.05 % cream, Apply topically 2 (two) times daily as needed., Disp: , Rfl:    Clotrimazole  1 % OINT, Apply 1 Application topically 2 (two) times a week. (Patient taking differently: Apply 1 Application topically 2 (two) times a week. As needed), Disp: 30 g, Rfl: 11   colchicine  0.6 MG tablet, Take 1 tablet by mouth daily as needed for gout flare, Disp: 30 tablet, Rfl: 0   Cyanocobalamin  (B-12) 1000 MCG SUBL, Place 1 tablet under the tongue daily at 6 (six) AM., Disp: 90 tablet, Rfl: 3   cyclobenzaprine  (FLEXERIL ) 10 MG tablet, Take 1-2 tablets (10-20 mg total) by mouth 3 (three) times daily as needed for muscle spasms., Disp: 30  tablet, Rfl: 0   empagliflozin  (JARDIANCE ) 10 MG TABS tablet, Take 1 tablet (10 mg total) by mouth daily., Disp: 90 tablet, Rfl: 3   fluticasone  (FLONASE ) 50 MCG/ACT nasal spray, Place 2 sprays into both nostrils daily. (Patient taking differently: Place 2 sprays into both nostrils daily as needed for allergies.), Disp: 16 g, Rfl: 0   folic acid  (FOLVITE ) 1 MG tablet, Take 1 tablet (1 mg total) by mouth daily as needed (low iron)., Disp: 90 tablet, Rfl: 3   HYDROmorphone  (DILAUDID ) 2 MG tablet, Take by mouth., Disp: , Rfl:    levothyroxine  (SYNTHROID ) 50 MCG tablet, Take 1 tablet (50 mcg total) by mouth daily. Needs to be adjusted every 3-6 months based on labwork if still taking amiodarone , Disp: 90 tablet, Rfl: 3   Magnesium  Bisglycinate (MAG GLYCINATE) 100 MG TABS, Take 1 tablet by mouth daily., Disp: 90 tablet, Rfl: 3   meclizine (ANTIVERT) 25 MG tablet, Take 25 mg by mouth 3 (three) times daily as needed for dizziness., Disp: , Rfl:    metoprolol  succinate (TOPROL -XL) 50 MG 24 hr tablet, Take 1 tablet (50 mg total) by mouth daily., Disp: 90 tablet, Rfl: 3   Multiple Vitamins-Minerals (MULTIVITAMIN WITH MINERALS) tablet, Take 1 tablet by mouth daily., Disp: , Rfl:    naltrexone  (DEPADE) 50 MG tablet, Take 0.5 tablets (25 mg total) by mouth daily., Disp: 90 tablet, Rfl: 1   potassium  chloride (KLOR-CON ) 10 MEQ tablet, Take 1 tablet (10 mEq total) by mouth daily. Take with torsemide  because torsemide  lowers potassium, Disp: 90 tablet, Rfl: 3   rivaroxaban  (XARELTO ) 20 MG TABS tablet, Take 1 tablet (20 mg total) by mouth daily with supper., Disp: 90 tablet, Rfl: 3   rosuvastatin  (CRESTOR ) 20 MG tablet, Take 1 tablet (20 mg total) by mouth daily. Replaces 10 mg dose, can cause cramps, Disp: 90 tablet, Rfl: 3   sacubitril -valsartan  (ENTRESTO ) 97-103 MG, Take 1 tablet by mouth 2 (two) times daily., Disp: 60 tablet, Rfl: 11   spironolactone  (ALDACTONE ) 25 MG tablet, Take 1 tablet (25 mg total) by mouth  daily. Increased dose, to allow to stop torsemide , Disp: 90 tablet, Rfl: 3   torsemide  (DEMADEX ) 20 MG tablet, TAKE 1 TABLET BY MOUTH EVERY DAY, Disp: 90 tablet, Rfl: 1   traZODone  (DESYREL ) 50 MG tablet, Take 25 mg by mouth at bedtime as needed., Disp: , Rfl:    Objective:     There were no vitals filed for this visit.    There is no height or weight on file to calculate BMI.    Physical Exam:    ***   Electronically signed by:  Marshall Skeeter D.[facility] 3:44 PM 04/01/24

## 2024-04-02 ENCOUNTER — Ambulatory Visit: Admitting: Sports Medicine

## 2024-04-05 ENCOUNTER — Ambulatory Visit (INDEPENDENT_AMBULATORY_CARE_PROVIDER_SITE_OTHER)

## 2024-04-05 ENCOUNTER — Ambulatory Visit: Admitting: Sports Medicine

## 2024-04-05 VITALS — BP 132/84 | HR 78 | Ht 71.0 in | Wt 247.0 lb

## 2024-04-05 DIAGNOSIS — G8929 Other chronic pain: Secondary | ICD-10-CM | POA: Diagnosis not present

## 2024-04-05 DIAGNOSIS — M545 Low back pain, unspecified: Secondary | ICD-10-CM | POA: Diagnosis not present

## 2024-04-05 DIAGNOSIS — S32020D Wedge compression fracture of second lumbar vertebra, subsequent encounter for fracture with routine healing: Secondary | ICD-10-CM | POA: Diagnosis not present

## 2024-04-05 NOTE — Progress Notes (Signed)
 Parker [name] D.[name] [address] [place] [address] Phone: [phone]   Assessment and Plan:       1. Closed compression fracture of L2 lumbar vertebra with routine healing, subsequent encounter 2. Chronic bilateral low back pain without sciatica - Chronic with exacerbation, subsequent visit - Overall improvement with relative rest, Tylenol .  Stable appearance of L2 compression fracture on repeat x-ray - Do not recommend NSAIDs with patient on chronic anticoagulation with Xarelto  - Start Tylenol  500 to 1000 mg tablets 2-3 times a day for day-to-day pain relief.  Could use Tylenol  PM for third dose instead of Tylenol  [phone] -Continue HEP and start physical therapy.  Referral sent  Pertinent previous records reviewed include none  Follow Up: As needed if no improvement or worsening of symptoms   Subjective:   I, Parker [name], am serving as a Neurosurgeon for Doctor Parker [name]   Chief Complaint: low back pain    HPI:    03/01/24 Patient is a 58 year old male with low back pain. Patient states pain started about a week ago. No MOI. Flexeril  is not helping. Has some numbness and tingling. Decreased ROM . Isnt able to sleep through the night. No radiating pain.    04/05/2024 Patient states he is feeling better since last visit . Would like to go over xrays    Relevant Historical Information: Hypertension, atrial fibrillation, HFpEF, OSA, NAFLD, DM type II  Additional pertinent review of systems negative.   Current Outpatient Medications:    albuterol  (VENTOLIN  HFA) 108 (90 Base) MCG/ACT inhaler, Inhale 2 puffs into the lungs every 6 (six) hours as needed for shortness of breath., Disp: , Rfl:    allopurinol  (ZYLOPRIM ) 100 MG tablet, Take 0.5 tablets (50 mg total) by mouth daily., Disp: 180 tablet, Rfl: 1   amiodarone  (PACERONE ) 200 MG tablet, Take 100 mg by mouth daily., Disp: , Rfl:    augmented betamethasone dipropionate  (DIPROLENE-AF) 0.05 % cream, Apply topically 2 (two) times daily as needed., Disp: , Rfl:    Clotrimazole  1 % OINT, Apply 1 Application topically 2 (two) times a week. (Patient taking differently: Apply 1 Application topically 2 (two) times a week. As needed), Disp: 30 g, Rfl: 11   colchicine  0.6 MG tablet, Take 1 tablet by mouth daily as needed for gout flare, Disp: 30 tablet, Rfl: 0   Cyanocobalamin  (B-12) 1000 MCG SUBL, Place 1 tablet under the tongue daily at 6 (six) AM., Disp: 90 tablet, Rfl: 3   cyclobenzaprine  (FLEXERIL ) 10 MG tablet, Take 1-2 tablets (10-20 mg total) by mouth 3 (three) times daily as needed for muscle spasms., Disp: 30 tablet, Rfl: 0   empagliflozin  (JARDIANCE ) 10 MG TABS tablet, Take 1 tablet (10 mg total) by mouth daily., Disp: 90 tablet, Rfl: 3   fluticasone  (FLONASE ) 50 MCG/ACT nasal spray, Place 2 sprays into both nostrils daily. (Patient taking differently: Place 2 sprays into both nostrils daily as needed for allergies.), Disp: 16 g, Rfl: 0   folic acid  (FOLVITE ) 1 MG tablet, Take 1 tablet (1 mg total) by mouth daily as needed (low iron)., Disp: 90 tablet, Rfl: 3   HYDROmorphone  (DILAUDID ) 2 MG tablet, Take by mouth., Disp: , Rfl:    levothyroxine  (SYNTHROID ) 50 MCG tablet, Take 1 tablet (50 mcg total) by mouth daily. Needs to be adjusted every 3-6 months based on labwork if still taking amiodarone , Disp: 90 tablet, Rfl: 3   Magnesium  Bisglycinate (MAG GLYCINATE)  100 MG TABS, Take 1 tablet by mouth daily., Disp: 90 tablet, Rfl: 3   meclizine (ANTIVERT) 25 MG tablet, Take 25 mg by mouth 3 (three) times daily as needed for dizziness., Disp: , Rfl:    metoprolol  succinate (TOPROL -XL) 50 MG 24 hr tablet, Take 1 tablet (50 mg total) by mouth daily., Disp: 90 tablet, Rfl: 3   Multiple Vitamins-Minerals (MULTIVITAMIN WITH MINERALS) tablet, Take 1 tablet by mouth daily., Disp: , Rfl:    naltrexone  (DEPADE) 50 MG tablet, Take 0.5 tablets (25 mg total) by mouth daily., Disp: 90  tablet, Rfl: 1   potassium chloride  (KLOR-CON ) 10 MEQ tablet, Take 1 tablet (10 mEq total) by mouth daily. Take with torsemide  because torsemide  lowers potassium, Disp: 90 tablet, Rfl: 3   rivaroxaban  (XARELTO ) 20 MG TABS tablet, Take 1 tablet (20 mg total) by mouth daily with supper., Disp: 90 tablet, Rfl: 3   rosuvastatin  (CRESTOR ) 20 MG tablet, Take 1 tablet (20 mg total) by mouth daily. Replaces 10 mg dose, can cause cramps, Disp: 90 tablet, Rfl: 3   sacubitril -valsartan  (ENTRESTO ) 97-103 MG, Take 1 tablet by mouth 2 (two) times daily., Disp: 60 tablet, Rfl: 11   spironolactone  (ALDACTONE ) 25 MG tablet, Take 1 tablet (25 mg total) by mouth daily. Increased dose, to allow to stop torsemide , Disp: 90 tablet, Rfl: 3   torsemide  (DEMADEX ) 20 MG tablet, TAKE 1 TABLET BY MOUTH EVERY DAY, Disp: 90 tablet, Rfl: 1   traZODone  (DESYREL ) 50 MG tablet, Take 25 mg by mouth at bedtime as needed., Disp: , Rfl:    Objective:     Vitals:   04/05/24 1447  Weight: 247 lb (112 kg)  Height: 5\' 11"  (1.803 m)      Body mass index is 34.45 kg/m.    Physical Exam:    Gen: Appears well, nad, nontoxic and pleasant Psych: Alert and oriented, appropriate mood and affect Neuro: sensation intact, strength is 5/5 in upper and lower extremities, muscle tone wnl Skin: no susupicious lesions or rashes  Back - Normal skin, Spine with normal alignment and no deformity.   No tenderness to vertebral process palpation.   Bilateral lumbar paraspinous muscles are mildly tender and without spasm NTTP gluteal musculature Straight leg raise negative Trendelenberg negative Piriformis Test negative Gait normal    Electronically signed by:  Marshall Skeeter D.[name] 2:53 PM 04/05/24

## 2024-04-05 NOTE — Patient Instructions (Signed)
 Tylenol  [phone] mg 2-3 times a day for pain relief  Continue HEP  PT referral  As needed follow up

## 2024-04-06 ENCOUNTER — Encounter: Payer: Self-pay | Admitting: Sports Medicine

## 2024-04-15 ENCOUNTER — Other Ambulatory Visit: Payer: Self-pay

## 2024-04-15 ENCOUNTER — Emergency Department (HOSPITAL_COMMUNITY)

## 2024-04-15 ENCOUNTER — Emergency Department (HOSPITAL_COMMUNITY)
Admission: EM | Admit: 2024-04-15 | Discharge: 2024-04-16 | Disposition: A | Discharge status: Home / Self Care | Attending: Emergency Medicine | Admitting: Emergency Medicine

## 2024-04-15 ENCOUNTER — Encounter (HOSPITAL_COMMUNITY): Payer: Self-pay | Admitting: Emergency Medicine

## 2024-04-15 DIAGNOSIS — E039 Hypothyroidism, unspecified: Secondary | ICD-10-CM | POA: Insufficient documentation

## 2024-04-15 DIAGNOSIS — I10 Essential (primary) hypertension: Secondary | ICD-10-CM | POA: Diagnosis not present

## 2024-04-15 DIAGNOSIS — E1122 Type 2 diabetes mellitus with diabetic chronic kidney disease: Secondary | ICD-10-CM | POA: Diagnosis not present

## 2024-04-15 DIAGNOSIS — Z8616 Personal history of COVID-19: Secondary | ICD-10-CM | POA: Insufficient documentation

## 2024-04-15 DIAGNOSIS — X501XXA Overexertion from prolonged static or awkward postures, initial encounter: Secondary | ICD-10-CM | POA: Diagnosis not present

## 2024-04-15 DIAGNOSIS — M25561 Pain in right knee: Secondary | ICD-10-CM | POA: Insufficient documentation

## 2024-04-15 DIAGNOSIS — I130 Hypertensive heart and chronic kidney disease with heart failure and stage 1 through stage 4 chronic kidney disease, or unspecified chronic kidney disease: Secondary | ICD-10-CM | POA: Insufficient documentation

## 2024-04-15 DIAGNOSIS — S8991XA Unspecified injury of right lower leg, initial encounter: Secondary | ICD-10-CM | POA: Diagnosis not present

## 2024-04-15 DIAGNOSIS — I5032 Chronic diastolic (congestive) heart failure: Secondary | ICD-10-CM | POA: Diagnosis not present

## 2024-04-15 DIAGNOSIS — M25461 Effusion, right knee: Secondary | ICD-10-CM | POA: Diagnosis not present

## 2024-04-15 DIAGNOSIS — N1831 Chronic kidney disease, stage 3a: Secondary | ICD-10-CM | POA: Insufficient documentation

## 2024-04-15 MED ORDER — OXYCODONE-ACETAMINOPHEN 5-325 MG PO TABS
1.0000 | ORAL_TABLET | Freq: Once | ORAL | Status: AC
  Administered 2024-04-15: 1 via ORAL
  Filled 2024-04-15: qty 1

## 2024-04-15 NOTE — Progress Notes (Signed)
 Orthopedic Tech Progress Note Patient Details:  Parker [name] 01/04/1966 [id number]  Ortho Devices Type of Ortho Device: Knee Sleeve Ortho Device/Splint Location: RLE Ortho Device/Splint Interventions: Application, Ordered   Post Interventions Patient Tolerated: Well Instructions Provided: Care of device  Brock Larmon L Holt Woolbright 04/15/2024, 11:55 PM

## 2024-04-15 NOTE — Discharge Instructions (Addendum)
 Parker [name]:  Thank you for allowing us  to take care of you today.  We hope you begin feeling better soon. You were seen today for R knee pain.  It appears you began to have pain after an injury.  X-rays were performed which revealed no fracture or dislocation.  Please use RICE therapy as indicated in the discharge instructions.  He can use Tylenol  for pain. I have sent an orthopedic referral.  To-Do:  Please follow-up with your primary doctor within the next 2-3 days. It is important that you review any labs or imaging results (if any) that you had today with them. Your preliminary imaging results (if any) are attached. Please return to the Emergency Department or call 911 if you experience chest pain, shortness of breath, severe pain, severe fever, altered mental status, or have any reason to think that you need emergency medical care.  Thank you again.  Hope you feel better soon.  Arminda Landmark, MD Department of Emergency Medicine

## 2024-04-15 NOTE — ED Provider Notes (Addendum)
 [name] AT [facility] Provider Note  History  Chief Complaint:  Leg Injury  The history is provided by the patient.  Knee Pain Location:  Knee Time since incident:  4 hours Injury: yes   Mechanism of injury comment:  Stepped off of crub and twisted knee Knee location:  R knee Pain details:    Quality:  Pressure   Radiates to:  Does not radiate   Severity:  Moderate   Onset quality:  Gradual   Timing:  Constant   Progression:  Unchanged Chronicity:  New Associated symptoms: decreased ROM, stiffness and swelling   Associated symptoms: no fever and no numbness      P[name] is a 58 y.o. male with a history of CHF, DM who presents with traumatic R knee pain after twisting his knee after he stepped off a curb.  Past Medical History:  Diagnosis Date   (HFpEF) heart failure with preserved ejection fraction (HCC)    Acute on chronic heart failure (HCC) 05/16/2022   AKI (acute kidney injury) (HCC) 01/23/2023   Lab Results  Component  Value  Date     CREATININE  0.97  03/03/2023     CREATININE  1.14  02/19/2023     CREATININE  1.29 (H)  02/18/2023         Arthritis    knees. elbow, ankle   Cataract    CHF (congestive heart failure) (HCC)    Diabetes mellitus without complication (HCC)    Diastolic CHF (HCC) 02/14/2023   Dyspnea    when in A-fib   Dysrhythmia    A-fib   GERD (gastroesophageal reflux disease)    takes Dexilant prn   History of COVID-19 05/18/2021   Formatting of this note might be different from the original. Not requiring hospitalization Formatting of this note might be different from the original. Not requiring hospitalization   Hypertension    takes Lisinopril -HCTZ daily   Hypothyroidism    Kidney injury 03/07/2023   Chronic kidney disease, stage 3a (ICD-10: N18.31): GFR 58.20 mL/min on 07/09/2023 indicates mild kidney dysfunction. Monitor closely and adjust medications as needed.            Lab Results       Component    Value    Date/Time           CREATININE    1.35    07/09/2023 02:47 PM           CREATININE    1.20    03/12/2023 12:10 PM           CREATININE    0.97    03/03/2023 02:39 PM           CREATI   Localized swelling of left lower extremity 01/11/2020   Localized swelling of right lower extremity 04/24/2020   Muscle spasm    takes Relafen  daily   OSA on CPAP 03/07/2023   does not use CPAP   Peripheral edema    Plantar fasciitis    right   Situational insomnia 01/06/2018   Sleep apnea    Sprain of deltoid ligament of left ankle 08/10/2019    Past Surgical History:  Procedure Laterality Date   CARDIOVERSION N/A 02/19/2023   Procedure: CARDIOVERSION;  Surgeon: Mardell Shade, MD;  Location: [facility] ENDOSCOPY;  Service: Cardiovascular;  Laterality: N/A;   EAR CYST EXCISION N/A 08/06/2013   Procedure: CYST REMOVAL;  Surgeon: Littie Rife, MD;  Location: [facility]  OR;  Service: ENT;  Laterality: N/A;   ESOPHAGOGASTRODUODENOSCOPY     left knee arthroscopy  2003   PAROTIDECTOMY Right 08/06/2013   Procedure: PAROTIDECTOMY;  Surgeon: Littie Rife, MD;  Location: [facility] OR;  Service: ENT;  Laterality: Right;   RIGHT HEART CATH N/A 02/17/2023   Procedure: RIGHT HEART CATH;  Surgeon: Mardell Shade, MD;  Location: [facility] INVASIVE CV LAB;  Service: Cardiovascular;  Laterality: N/A;   SYNOVECTOMY Left 01/26/2024   Procedure: EXTENSOR TENOSYNOVECTOMY LEFT WRIST;  Surgeon: Brunilda Capra, MD;  Location: [facility];  Service: Orthopedics;  Laterality: Left;  Regional block   TEE WITHOUT CARDIOVERSION N/A 02/19/2023   Procedure: TRANSESOPHAGEAL ECHOCARDIOGRAM (TEE);  Surgeon: Mardell Shade, MD;  Location: [facility] ENDOSCOPY;  Service: Cardiovascular;  Laterality: N/A;   TONSILLECTOMY Bilateral 08/06/2013   Procedure: TONSILLECTOMY;  Surgeon: Littie Rife, MD;  Location: [facility] OR;  Service: ENT;  Laterality: Bilateral;    History reviewed. No pertinent family history.  Social History    Tobacco Use   Smoking status: Never   Smokeless tobacco: Never  Vaping Use   Vaping status: Never Used  Substance Use Topics   Alcohol use: Yes    Alcohol/week: 6.0 standard drinks of alcohol    Types: 6 Standard drinks or equivalent per week    Comment: drinks wine   Drug use: No    Review of Systems  Review of Systems  Constitutional:  Negative for fever.  Musculoskeletal:  Positive for stiffness.     Reviewed and documented in HPI if pertinent.   Physical Exam   ED Triage Vitals  Encounter Vitals Group     BP 04/15/24 2208 (!) 162/104     Systolic BP Percentile --      Diastolic BP Percentile --      Pulse Rate 04/15/24 2208 82     Resp 04/15/24 2208 18     Temp 04/15/24 2208 98.8 F (37.1 C)     Temp Source 04/15/24 2208 Oral     SpO2 04/15/24 2208 93 %     Weight 04/15/24 2209 230 lb (104.3 kg)     Height 04/15/24 2209 5\' 11"  (1.803 m)     Head Circumference --      Peak Flow --      Pain Score 04/15/24 2209 8     Pain Loc --      Pain Education --      Exclude from Growth Chart --      Physical Exam Vitals and nursing note reviewed.  Constitutional:      General: He is not in acute distress.    Appearance: He is well-developed.  HENT:     Head: Normocephalic and atraumatic.  Eyes:     Conjunctiva/sclera: Conjunctivae normal.  Cardiovascular:     Rate and Rhythm: Normal rate and regular rhythm.     Heart sounds: No murmur heard. Pulmonary:     Effort: Pulmonary effort is normal. No respiratory distress.  Abdominal:     Palpations: Abdomen is soft.     Tenderness: There is no abdominal tenderness.  Musculoskeletal:        General: No swelling.     Cervical back: Neck supple.     Comments: 2+ DP and PT pulses in LLE Swelling noted to L knee No erythema appreciated Medial and Lateral Joint line tenderness Intact sensation to medial and lateral lower leg, medial ankle, lateral ankle; intact sensation to medial and lateral L thigh  Skin:     General: Skin is warm and dry.     Capillary Refill: Capillary refill takes less than 2 seconds.  Neurological:     Mental Status: He is alert.  Psychiatric:        Mood and Affect: Mood normal.      Procedures   Procedures  ED Course - Medical Decision Making  Brief Overview GIONNI VACA is a 58 y.o. male who presents as per above.  I have reviewed the nursing documentation for past medical history, family history, and social history and agree.  I have reviewed the patient's vital signs. There are no abnormalities.  Initial Differential Diagnoses: I am primarily concerned for fracture, dislocation, MSK sprain, ligamentous injury.  Therapies: These medications and interventions were provided for the patient while in the ED.  Medications  oxyCODONE -acetaminophen  (PERCOCET/ROXICET) 5-325 MG per tablet 1 tablet (1 tablet Oral Given 04/15/24 2319)    Testing Results: On my interpretation imaging is significant for: XR R knee without fracture or dislocation.  See the EMR for full details regarding lab and imaging results.   Medical Decision Making 58 year old male who presents to the emergency department after twisting his knee stepping off a curb.  He reports that he did not have pain prior to that.  He reports that he has noted swelling since then.  On exam patient does have medial and lateral joint line tenderness.  There is some mild swelling about the knee.  He has a normal neurovascular exam.  He has no erythema.  Patient states he did not hit his head or lose consciousness.  No blood thinner use.  Due to concern for fracture or dislocation x-rays were performed.  I do not see have any evidence of a fracture or dislocation.  Patient does have mild swelling noted on the x-rays.  Most likely patient suffering from MSK sprain.  This could be a meniscus or ligamentous injury.  Therefore we will place patient in brace here in the emergency department and have him follow-up  with orthopedics.  Instructed him to take Tylenol  for pain control.  We discussed after his AVS was printed that he should only take Tylenol  given his Xarelto .  He was agreeable this plan.  Did place referral to orthopedics.  Patient discharged in hemodynamically stable condition.  Amount and/or Complexity of Data Reviewed Radiology: ordered.  Risk Prescription drug management.     ### All radiography studies, electrocardiograms, and laboratory data were personally reviewed by me and incorporated into my medical decision making. Impression   1. Acute pain of right knee      Note: Dragon medical dictation software was used in the creation of this note.      Arminda Landmark, MD 04/15/24 2351    Guadalupe Lee, MD 04/20/24 [phone]

## 2024-04-15 NOTE — ED Triage Notes (Signed)
 BIB [facility]. Pt reports that have been playing around on the floor with his cousin. Pt states that he heard a pop and has had increased swelling and is unable to bear weight.

## 2024-04-19 ENCOUNTER — Other Ambulatory Visit: Payer: Self-pay

## 2024-04-19 ENCOUNTER — Telehealth: Payer: Self-pay

## 2024-04-19 DIAGNOSIS — S838X1A Sprain of other specified parts of right knee, initial encounter: Secondary | ICD-10-CM | POA: Diagnosis not present

## 2024-04-19 DIAGNOSIS — M25561 Pain in right knee: Secondary | ICD-10-CM

## 2024-04-19 NOTE — Telephone Encounter (Signed)
 Spoke with pt placed a referral to ortho surgery urgent.

## 2024-04-19 NOTE — Telephone Encounter (Signed)
 Need appt for referral ?  Copied from CRM [id number]. Topic: Referral - Request for Referral >> Apr 19, 2024  9:12 AM [name] wrote: Did the patient discuss referral with their provider in the last year? Yes (If No - schedule appointment) (If Yes - send message)  Appointment offered? No  Type of order/referral and detailed reason for visit: orthro provider   Preference of office, provider, location: no  If referral order, have you been seen by this specialty before? No (If Yes, this issue or another issue? When? Where?  Can we respond through MyChart? Yes

## 2024-04-20 ENCOUNTER — Other Ambulatory Visit: Payer: Self-pay

## 2024-04-20 ENCOUNTER — Encounter (HOSPITAL_COMMUNITY): Payer: Self-pay

## 2024-04-20 ENCOUNTER — Emergency Department (HOSPITAL_COMMUNITY)
Admission: EM | Admit: 2024-04-20 | Discharge: 2024-04-20 | Disposition: A | Discharge status: Home / Self Care | Attending: Emergency Medicine | Admitting: Emergency Medicine

## 2024-04-20 ENCOUNTER — Ambulatory Visit: Admitting: Orthopaedic Surgery

## 2024-04-20 DIAGNOSIS — I509 Heart failure, unspecified: Secondary | ICD-10-CM | POA: Diagnosis not present

## 2024-04-20 DIAGNOSIS — R1111 Vomiting without nausea: Secondary | ICD-10-CM | POA: Diagnosis not present

## 2024-04-20 DIAGNOSIS — M25561 Pain in right knee: Secondary | ICD-10-CM | POA: Insufficient documentation

## 2024-04-20 DIAGNOSIS — S838X1A Sprain of other specified parts of right knee, initial encounter: Secondary | ICD-10-CM | POA: Diagnosis not present

## 2024-04-20 DIAGNOSIS — R609 Edema, unspecified: Secondary | ICD-10-CM | POA: Diagnosis not present

## 2024-04-20 DIAGNOSIS — I10 Essential (primary) hypertension: Secondary | ICD-10-CM | POA: Diagnosis not present

## 2024-04-20 DIAGNOSIS — R0689 Other abnormalities of breathing: Secondary | ICD-10-CM | POA: Diagnosis not present

## 2024-04-20 MED ORDER — HYDROCODONE-ACETAMINOPHEN 5-325 MG PO TABS: 1.0000 | ORAL_TABLET | ORAL | 0 refills | Status: DC | PRN

## 2024-04-20 NOTE — ED Provider Notes (Signed)
 [place] [facility]GENC[facility] DEPARTMENT AT [facility] Provider Note   CSN: [id number] Arrival date & time: 04/20/24  0021     History  Chief Complaint  Patient presents with   Knee Pain    P[name] is a 58 y.o. male. Patient presents to the emergency department complaining of right knee pain.  On May 15 the patient was seen in the emergency department for pain in the same knee.  He states he stepped on his car and felt a twisting with a sudden pop and sudden pain.  He had negative x-rays at that time and was given a knee brace and told to follow-up with orthopedics.  He has been unable to see orthopedics yet.  He states he is here tonight due to continued knee pain.  He denies any new injury.  He did get transported via EMS who administered 200 mcg of fentanyl  during transport.  Past medical history significant for type II DM with diabetic polyneuropathy, chronic heart failure   Knee Pain      Home Medications Prior to Admission medications   Medication Sig Start Date End Date Taking? Authorizing Provider  HYDROcodone -acetaminophen  (NORCO/VICODIN) 5-325 MG tablet Take 1 tablet by mouth every 4 (four) hours as needed. 04/20/24  Yes Elisa Guest, PA-C  albuterol  (VENTOLIN  HFA) 108 (90 Base) MCG/ACT inhaler Inhale 2 puffs into the lungs every 6 (six) hours as needed for shortness of breath. 09/18/22   [provider]  allopurinol  (ZYLOPRIM ) 100 MG tablet Take 0.5 tablets (50 mg total) by mouth daily. 12/15/23   Anthon Kins, MD  amiodarone  (PACERONE ) 200 MG tablet Take 100 mg by mouth daily.    [provider]  augmented betamethasone dipropionate (DIPROLENE-AF) 0.05 % cream Apply topically 2 (two) times daily as needed. 03/19/23   [provider]  Clotrimazole  1 % OINT Apply 1 Application topically 2 (two) times a week. Patient taking differently: Apply 1 Application topically 2 (two) times a week. As needed 04/07/23   Anthon Kins, MD   colchicine  0.6 MG tablet Take 1 tablet by mouth daily as needed for gout flare 11/16/23   Avegno, Komlanvi S, FNP  Cyanocobalamin  (B-12) 1000 MCG SUBL Place 1 tablet under the tongue daily at 6 (six) AM. 10/24/23   Anthon Kins, MD  cyclobenzaprine  (FLEXERIL ) 10 MG tablet Take 1-2 tablets (10-20 mg total) by mouth 3 (three) times daily as needed for muscle spasms. 02/27/24   Versa Gore, NP  empagliflozin  (JARDIANCE ) 10 MG TABS tablet Take 1 tablet (10 mg total) by mouth daily. 02/11/24   Bensimhon, Rheta Celestine, MD  fluticasone  (FLONASE ) 50 MCG/ACT nasal spray Place 2 sprays into both nostrils daily. Patient taking differently: Place 2 sprays into both nostrils daily as needed for allergies. 09/16/22   Lanetta Pion, NP  folic acid  (FOLVITE ) 1 MG tablet Take 1 tablet (1 mg total) by mouth daily as needed (low iron). 10/24/23   Anthon Kins, MD  HYDROmorphone  (DILAUDID ) 2 MG tablet Take by mouth. 01/30/24   [provider]  levothyroxine  (SYNTHROID ) 50 MCG tablet Take 1 tablet (50 mcg total) by mouth daily. Needs to be adjusted every 3-6 months based on labwork if still taking amiodarone  03/09/23   Anthon Kins, MD  Magnesium  Bisglycinate (MAG GLYCINATE) 100 MG TABS Take 1 tablet by mouth daily. 02/11/24   Bensimhon, Daniel R, MD  meclizine (ANTIVERT) 25 MG tablet Take 25 mg by mouth 3 (three) times daily  as needed for dizziness. 07/07/22   [provider]  metoprolol  succinate (TOPROL -XL) 50 MG 24 hr tablet Take 1 tablet (50 mg total) by mouth daily. 03/09/23   Anthon Kins, MD  Multiple Vitamins-Minerals (MULTIVITAMIN WITH MINERALS) tablet Take 1 tablet by mouth daily.    [provider]  naltrexone  (DEPADE) 50 MG tablet Take 0.5 tablets (25 mg total) by mouth daily. 08/11/23   Anthon Kins, MD  potassium chloride  (KLOR-CON ) 10 MEQ tablet Take 1 tablet (10 mEq total) by mouth daily. Take with torsemide  because torsemide  lowers potassium 03/09/23   Anthon Kins, MD  rivaroxaban  (XARELTO ) 20 MG TABS tablet Take 1 tablet (20 mg total) by mouth daily with supper. 03/07/23   Anthon Kins, MD  rosuvastatin  (CRESTOR ) 20 MG tablet Take 1 tablet (20 mg total) by mouth daily. Replaces 10 mg dose, can cause cramps 10/24/23   Anthon Kins, MD  sacubitril -valsartan  (ENTRESTO ) 97-103 MG Take 1 tablet by mouth 2 (two) times daily. 01/20/24   Elmarie Hacking, FNP  spironolactone  (ALDACTONE ) 25 MG tablet Take 1 tablet (25 mg total) by mouth daily. Increased dose, to allow to stop torsemide  02/27/24   Bensimhon, Rheta Celestine, MD  torsemide  (DEMADEX ) 20 MG tablet TAKE 1 TABLET BY MOUTH EVERY DAY 12/14/23   Anthon Kins, MD  traZODone  (DESYREL ) 50 MG tablet Take 25 mg by mouth at bedtime as needed. 10/24/23   [provider]      Allergies    Iodinated contrast media and Other    Review of Systems   Review of Systems  Physical Exam Updated Vital Signs BP (!) 148/102   Pulse 83   Temp 98.1 F (36.7 C) (Oral)   Resp 18   SpO2 96%  Physical Exam Vitals and nursing note reviewed.  Constitutional:      General: He is not in acute distress.    Appearance: He is well-developed.  HENT:     Head: Normocephalic and atraumatic.  Eyes:     Conjunctiva/sclera: Conjunctivae normal.  Cardiovascular:     Rate and Rhythm: Normal rate and regular rhythm.     Heart sounds: No murmur heard. Pulmonary:     Effort: Pulmonary effort is normal. No respiratory distress.  Abdominal:     Palpations: Abdomen is soft.     Tenderness: There is no abdominal tenderness.  Musculoskeletal:        General: No swelling.     Cervical back: Neck supple.     Comments: 2+ DP and PT pulses in LLE Swelling noted to L knee No erythema appreciated Medial and Lateral Joint line tenderness Intact sensation to medial and lateral lower leg, medial ankle, lateral ankle; intact sensation to medial and lateral L thigh   Skin:    General: Skin is warm and dry.      Capillary Refill: Capillary refill takes less than 2 seconds.  Neurological:     Mental Status: He is alert.  Psychiatric:        Mood and Affect: Mood normal.     ED Results / Procedures / Treatments   Labs (all labs ordered are listed, but only abnormal results are displayed) Labs Reviewed - No data to display  EKG None  Radiology No results found.  Procedures .Ortho Injury Treatment  Date/Time: 04/20/2024 2:36 AM  Performed by: Elisa Guest, PA-C Authorized by: Elisa Guest, PA-C   Consent:    Consent obtained:  Verbal  Consent given by:  Patient   Risks discussed:  Stiffness and restricted joint movementInjury location: knee Location details: right knee Injury type: soft tissue Pre-procedure neurovascular assessment: neurovascularly intact Immobilization: brace (Knee immobilizer) Splint Applied by: ED Nurse Post-procedure neurovascular assessment: post-procedure neurovascularly intact       Medications Ordered in ED Medications - No data to display  ED Course/ Medical Decision Making/ A&P                                 Medical Decision Making  This patient presents to the ED for concern of knee pain, this involves an extensive number of treatment options, and is a complaint that carries with it a high risk of complications and morbidity.  The differential diagnosis includes fracture, dislocation, soft tissue injury, others   Co morbidities that complicate the patient evaluation  CHF   Additional history obtained:  Additional history obtained from visitor at bedside External records from outside source obtained and reviewed including previous x-rays   Test / Admission - Considered:  Patient with continued right-sided knee pain post recent injury.  He has been unable to follow-up with orthopedic surgery.  No new injury.  No indication for repeat imaging at this time.  Patient was placed in a knee immobilizer as I believe this will provide  better support than the previously prescribed knee brace.  He states that his knee hurts worse when it moves while he is sleeping and the knee immobilizer should prevent this.  The patient is unable to take anti-inflammatories due to currently being on Xarelto .  I will prescribe a short course of Norco since the patient cannot take anti-inflammatories at this time.  Patient needs to follow-up with orthopedics and primary care for further pain management and further evaluation.  Patient voices understanding with plan.         Final Clinical Impression(s) / ED Diagnoses Final diagnoses:  Acute pain of right knee    Rx / DC Orders ED Discharge Orders          Ordered    HYDROcodone -acetaminophen  (NORCO/VICODIN) 5-325 MG tablet  Every 4 hours PRN        04/20/24 0205              Elisa Guest, PA-C 04/20/24 9147    Onetha Bile, MD 04/20/24 [phone]

## 2024-04-20 NOTE — Addendum Note (Signed)
 Addended by: Shondrika Hoque on: 04/20/2024 01:16 PM   Modules accepted: Orders

## 2024-04-20 NOTE — ED Triage Notes (Addendum)
 Pt arrives EMS from home with continued knee pain since injury a couple days ago. Pt had neg xray and referral to ortho. Pt reports pain radiates down leg. Pt received 200mcg fent IV PTA. Pt reports he has not been able to walk at all

## 2024-04-20 NOTE — Progress Notes (Signed)
 Office Visit Note   Patient: Parker [name]           Date of Birth: 02/14/1966           MRN: [id number] Visit Date: 04/20/2024              Requested by: Anthon Kins, MD [address] [employer],  [place] [address] PCP: Anthon Kins, MD   Assessment & Plan: Visit Diagnoses:  1. Acute pain of right knee     Plan: History of Present Illness Parker [name] is a 58 year old male with a history of right knee problems who presents with extreme right knee pain following an injury.  He experiences severe pain in the right knee, extending to the foot, following a pivoting injury while exiting his car, accompanied by a popping sound. The knee is significantly swollen and feels locked, requiring manual straightening. Popping sounds occur during extension attempts. Pain is diffuse and worsens with pressure or movement, severely limiting ambulation without crutches. He has had minimal sleep due to pain, totaling about eight hours over four days.  He went to the ED on 04/15/24 for evaluation.  X-rays did not show any acute abnormalities.  He has a history of similar left knee issues requiring surgical repair. He uses crutches and a straight leg brace, which provides some relief, especially during sleep. He needs assistance with daily activities due to pain and instability. Swelling, pain, and difficulty extending the knee are confirmed, with tenderness throughout the knee area. Driving is difficult due to pain when moving his leg between pedals.  Physical Exam MUSCULOSKELETAL: Right knee with diffuse swelling, large joint effusion, and pain. Exam limited by pain.  Assessment and Plan Severe right knee pain Suspected rupture of the quadriceps tendon in the right knee following a pivoting injury. Significant swelling and joint effusion noted, limiting examination. Imaging required for confirmation. - Order urgent MRI of the right knee. - Provide knee immobilizer to prevent  buckling.  Follow-Up Instructions: Return for after MRI.    Subjective: Chief Complaint  Patient presents with   Right Knee - Pain   Review of Systems  Constitutional: Negative.   HENT: Negative.    Eyes: Negative.   Respiratory: Negative.    Cardiovascular: Negative.   Gastrointestinal: Negative.   Endocrine: Negative.   Genitourinary: Negative.   Skin: Negative.   Allergic/Immunologic: Negative.   Neurological: Negative.   Hematological: Negative.   Psychiatric/Behavioral: Negative.    All other systems reviewed and are negative.    Objective: Vital Signs: There were no vitals taken for this visit.  Physical Exam Vitals and nursing note reviewed.  Constitutional:      Appearance: He is well-developed.  HENT:     Head: Normocephalic and atraumatic.  Eyes:     Pupils: Pupils are equal, round, and reactive to light.  Pulmonary:     Effort: Pulmonary effort is normal.  Abdominal:     Palpations: Abdomen is soft.  Musculoskeletal:        General: Normal range of motion.     Cervical back: Neck supple.  Skin:    General: Skin is warm.  Neurological:     Mental Status: He is alert and oriented to person, place, and time.  Psychiatric:        Behavior: Behavior normal.        Thought Content: Thought content normal.        Judgment: Judgment normal.  PMFS History: Patient Active Problem List   Diagnosis Date Noted   Vitamin D  deficiency 12/16/2023   Elevated AST (SGOT) 12/16/2023   Abnormal CBC 12/16/2023   Positive colorectal cancer screening using Cologuard test 10/24/2023   Diabetic polyneuropathy associated with type 2 diabetes mellitus (HCC) 10/24/2023   NAFLD (nonalcoholic fatty liver disease) 29/52/8413   B12 deficiency 08/12/2023   Folate deficiency 08/12/2023   Chronic kidney disease, stage 2 (mild) 08/12/2023   Swelling of hand 06/13/2023   Pain in right hand 06/13/2023   Pain involving joints of fingers of both hands 06/13/2023   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   High risk for readmission 03/09/2023   History of stroke 03/09/2023   hypokalemia, drug induced 03/09/2023   Vertigo 03/07/2023   Obesity, Class III, BMI 40-49.9 (morbid obesity) 01/25/2023   Controlled type 2 diabetes mellitus without complication, without long-term current use of insulin  (HCC) 01/23/2023   HTN (hypertension) 01/23/2023   Hypothyroidism 01/23/2023   Gout 01/23/2023   Edema of both lower extremities 01/09/2023   Prediabetes 01/08/2023   Cervical spondylosis 10/10/2021   Positive cardiac stress test 06/28/2021   Chronic heart failure with preserved ejection fraction (HCC) 05/18/2021   Alcohol use 05/18/2021   Depression 05/18/2021   Large liver 05/18/2021   H/O medication noncompliance 05/18/2021   Mitral valve regurgitation 05/18/2021   Tricuspid valve regurgitation 05/18/2021   IDA (iron deficiency anemia) 04/08/2021   LVH (left ventricular hypertrophy) 04/08/2021   Hypomagnesemia 04/08/2021   Idiopathic chronic gout 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 08/22/2020   Lymphedema 08/22/2020   OSA (obstructive sleep apnea) 01/18/2020   Transaminitis 01/11/2020   Arthralgia of left ankle 07/06/2019   Primary localized osteoarthritis of left knee 01/18/2019   Severe obesity (BMI 35.0-39.9) with comorbidity (HCC) 10/30/2018   Acute pain of right knee 10/23/2018   Erectile dysfunction 01/06/2018   Unspecified atrial fibrillation (HCC) 08/20/2017   Diverticulosis of large intestine without hemorrhage 10/16/2014   Tubular adenoma of colon 10/16/2014   Chronic neck pain 09/14/2014   Hyperlipidemia 08/26/2014   Erectile dysfunction due to arterial insufficiency 08/26/2014   Plantar fasciitis of right foot 04/06/2013   Deformity of metatarsal 04/06/2013   Acquired equinus deformity of foot 04/06/2013   Past Medical History:  Diagnosis Date   (HFpEF) heart failure with preserved ejection fraction (HCC)    Acute on chronic heart failure  (HCC) 05/16/2022   AKI (acute kidney injury) (HCC) 01/23/2023   Lab Results  Component  Value  Date     CREATININE  0.97  03/03/2023     CREATININE  1.14  02/19/2023     CREATININE  1.29 (H)  02/18/2023         Arthritis    knees. elbow, ankle   Cataract    CHF (congestive heart failure) (HCC)    Diabetes mellitus without complication (HCC)    Diastolic CHF (HCC) 02/14/2023   Dyspnea    when in A-fib   Dysrhythmia    A-fib   GERD (gastroesophageal reflux disease)    takes Dexilant prn   History of COVID-19 05/18/2021   Formatting of this note might be different from the original. Not requiring hospitalization Formatting of this note might be different from the original. Not requiring hospitalization   Hypertension    takes Lisinopril -HCTZ daily   Hypothyroidism    Kidney injury 03/07/2023   Chronic kidney disease, stage 3a (ICD-10: N18.31): GFR 58.20 mL/min on 07/09/2023 indicates mild kidney dysfunction. Monitor  closely and adjust medications as needed.            Lab Results      Component    Value    Date/Time           CREATININE    1.35    07/09/2023 02:47 PM           CREATININE    1.20    03/12/2023 12:10 PM           CREATININE    0.97    03/03/2023 02:39 PM           CREATI   Localized swelling of left lower extremity 01/11/2020   Localized swelling of right lower extremity 04/24/2020   Muscle spasm    takes Relafen  daily   OSA on CPAP 03/07/2023   does not use CPAP   Peripheral edema    Plantar fasciitis    right   Situational insomnia 01/06/2018   Sleep apnea    Sprain of deltoid ligament of left ankle 08/10/2019    No family history on file.  Past Surgical History:  Procedure Laterality Date   CARDIOVERSION N/A 02/19/2023   Procedure: CARDIOVERSION;  Surgeon: Mardell Shade, MD;  Location: [facility] ENDOSCOPY;  Service: Cardiovascular;  Laterality: N/A;   EAR CYST EXCISION N/A 08/06/2013   Procedure: CYST REMOVAL;  Surgeon: Littie Rife, MD;  Location: [facility] OR;   Service: ENT;  Laterality: N/A;   ESOPHAGOGASTRODUODENOSCOPY     left knee arthroscopy  2003   PAROTIDECTOMY Right 08/06/2013   Procedure: PAROTIDECTOMY;  Surgeon: Littie Rife, MD;  Location: [facility] OR;  Service: ENT;  Laterality: Right;   RIGHT HEART CATH N/A 02/17/2023   Procedure: RIGHT HEART CATH;  Surgeon: Mardell Shade, MD;  Location: [facility] INVASIVE CV LAB;  Service: Cardiovascular;  Laterality: N/A;   SYNOVECTOMY Left 01/26/2024   Procedure: EXTENSOR TENOSYNOVECTOMY LEFT WRIST;  Surgeon: Brunilda Capra, MD;  Location: [facility];  Service: Orthopedics;  Laterality: Left;  Regional block   TEE WITHOUT CARDIOVERSION N/A 02/19/2023   Procedure: TRANSESOPHAGEAL ECHOCARDIOGRAM (TEE);  Surgeon: Mardell Shade, MD;  Location: [facility] ENDOSCOPY;  Service: Cardiovascular;  Laterality: N/A;   TONSILLECTOMY Bilateral 08/06/2013   Procedure: TONSILLECTOMY;  Surgeon: Littie Rife, MD;  Location: [facility] OR;  Service: ENT;  Laterality: Bilateral;   Social History   Occupational History   Not on file  Tobacco Use   Smoking status: Never   Smokeless tobacco: Never  Vaping Use   Vaping status: Never Used  Substance and Sexual Activity   Alcohol use: Yes    Alcohol/week: 6.0 standard drinks of alcohol    Types: 6 Standard drinks or equivalent per week    Comment: drinks wine   Drug use: No   Sexual activity: Yes

## 2024-04-20 NOTE — Discharge Instructions (Signed)
 I have sent a course of pain medication to the pharmacy.  Please take as directed.  Further pain management should be evaluated and handled by your primary care team or orthopedic provider.  Please follow-up with orthopedics for further management of your right knee pain.  You may use the immobilizer to keep your knee from flexing.  Please continue to use crutches for ambulation.

## 2024-04-21 ENCOUNTER — Other Ambulatory Visit: Payer: Self-pay | Admitting: Physician Assistant

## 2024-04-21 ENCOUNTER — Telehealth: Payer: Self-pay | Admitting: Orthopaedic Surgery

## 2024-04-21 MED ORDER — METHOCARBAMOL 500 MG PO TABS: 500.0000 mg | ORAL_TABLET | Freq: Two times a day (BID) | ORAL | 2 refills | Status: DC | PRN

## 2024-04-21 NOTE — Telephone Encounter (Signed)
 I sent in robaxin to take in addition to this.

## 2024-04-21 NOTE — Telephone Encounter (Signed)
 Patient called and said that his right knee is swollen, and the oxycodone  isn't working. He has an MRI tomorrow but he don't think he can do it because of the knee pain. CB#[phone]

## 2024-04-21 NOTE — Telephone Encounter (Signed)
 Called and notified patient.

## 2024-04-22 ENCOUNTER — Ambulatory Visit
Admission: RE | Admit: 2024-04-22 | Discharge: 2024-04-22 | Disposition: A | Discharge status: Home / Self Care | Source: Ambulatory Visit | Attending: Orthopaedic Surgery | Admitting: Orthopaedic Surgery

## 2024-04-22 ENCOUNTER — Ambulatory Visit: Payer: Self-pay | Admitting: Orthopaedic Surgery

## 2024-04-22 DIAGNOSIS — M25561 Pain in right knee: Secondary | ICD-10-CM | POA: Diagnosis not present

## 2024-04-22 NOTE — Progress Notes (Signed)
 Needs f/u for MRI

## 2024-04-23 ENCOUNTER — Telehealth: Payer: Self-pay

## 2024-04-23 ENCOUNTER — Other Ambulatory Visit: Payer: Self-pay | Admitting: Physician Assistant

## 2024-04-23 MED ORDER — HYDROCODONE-ACETAMINOPHEN 5-325 MG PO TABS
1.0000 | ORAL_TABLET | Freq: Two times a day (BID) | ORAL | 0 refills | Status: DC | PRN
Start: 2024-04-23 — End: 2024-06-08

## 2024-04-23 NOTE — Telephone Encounter (Signed)
 Called patient no answer LMOM.

## 2024-04-23 NOTE — Telephone Encounter (Signed)
 Sent in a small rx for norco.  Ice and elevate as well

## 2024-04-23 NOTE — Telephone Encounter (Signed)
 Tried to call patient again. No answer. LMOM. Looks like he called back earlier and was scheduled for two weeks from now for follow up. Dr.Xu wants to see him next week. Also need to discuss his weight bearing status.  If patient calls back, please route him to me to speak with.

## 2024-04-23 NOTE — Telephone Encounter (Signed)
 I tried to call patient regarding MRI results. No answer. LMOM for patient to call me back urgently. Parker [name] wants patient to see Dr.Xu next week. He needs to be nonweightbearing on right leg due to fracture that was seen on MRI.

## 2024-04-23 NOTE — Progress Notes (Signed)
Tried to call patient. No answer. LMOM for patient to call me back.

## 2024-04-23 NOTE — Telephone Encounter (Signed)
 Patient called back. Scheduled him to see Dr. Christiane Cowing Tuesday. States Methocarbamol does not help and would like something else for pain. ED gave him hydrocodone  but is out.  Uses [facility] [address]  CB: [phone]

## 2024-04-27 ENCOUNTER — Ambulatory Visit: Admitting: Orthopaedic Surgery

## 2024-04-27 DIAGNOSIS — S83241A Other tear of medial meniscus, current injury, right knee, initial encounter: Secondary | ICD-10-CM | POA: Diagnosis not present

## 2024-04-27 DIAGNOSIS — S82121A Displaced fracture of lateral condyle of right tibia, initial encounter for closed fracture: Secondary | ICD-10-CM | POA: Insufficient documentation

## 2024-04-27 DIAGNOSIS — S83281A Other tear of lateral meniscus, current injury, right knee, initial encounter: Secondary | ICD-10-CM | POA: Diagnosis not present

## 2024-04-27 NOTE — Progress Notes (Signed)
 Office Visit Note   Patient: Parker [name]           Date of Birth: [birth year]           MRN: [id number] Visit Date: 04/27/2024              Requested by: Anthon Kins, MD [address] [place],  [place] [address] PCP: Anthon Kins, MD   Assessment & Plan: Visit Diagnoses:  1. Closed fracture of lateral portion of right tibial plateau, initial encounter   2. Acute medial meniscus tear of right knee, initial encounter   3. Tear of lateral meniscus of right knee, current, unspecified tear type, initial encounter     Plan: History of Present Illness Parker [name] is a 58 year old male who presents for review of an MRI right knee.  Two weeks post-injury, he experiences significant swelling and inflammation in the knee joint, with slight improvement in symptoms. MRI shows a nondisplaced tibial fracture and chronic degenerative changes in the meniscus, including light tears. He has a history of meniscus issues and previous surgical intervention. He is considering mobility aids for ambulation during recovery.  Examination of the right knee shows improvement in tenderness and swelling and range of motion.  Small joint effusion is present.  Results RADIOLOGY Knee MRI: Nondisplaced lateral tibial plateau fracture, degenerative appearing meniscus tears (04/15/2024)  Assessment and Plan Nondisplaced lateral tibial plateau fracture Two weeks post-injury with slight improvement. Expected healing in six weeks without surgery. - Prescribed Playmaker knee brace for support. - Recommended knee scooter for mobility. - Scheduled follow-up in four weeks for x-ray to assess healing and repeat radiographs. - If healing satisfactory, initiate physical therapy for gait training and strengthening.  Degenerative meniscus tear Degenerative meniscus tear with chronic changes on MRI, likely unrelated to tibial fracture. - Monitor symptoms. Consider arthroscopy if symptoms persist post-tibial  fracture healing.  Follow-Up Instructions: Return in about 4 weeks (around 05/25/2024).   Orders:  No orders of the defined types were placed in this encounter.  No orders of the defined types were placed in this encounter.     Procedures: No procedures performed   Clinical Data: No additional findings.   Subjective: Chief Complaint  Patient presents with   Right Knee - Follow-up    HPI  Review of Systems  Constitutional: Negative.   HENT: Negative.    Eyes: Negative.   Respiratory: Negative.    Cardiovascular: Negative.   Gastrointestinal: Negative.   Endocrine: Negative.   Genitourinary: Negative.   Skin: Negative.   Allergic/Immunologic: Negative.   Neurological: Negative.   Hematological: Negative.   Psychiatric/Behavioral: Negative.    All other systems reviewed and are negative.    Objective: Vital Signs: There were no vitals taken for this visit.  Physical Exam Vitals and nursing note reviewed.  Constitutional:      Appearance: He is well-developed.  HENT:     Head: Normocephalic and atraumatic.  Eyes:     Pupils: Pupils are equal, round, and reactive to light.  Pulmonary:     Effort: Pulmonary effort is normal.  Abdominal:     Palpations: Abdomen is soft.  Musculoskeletal:        General: Normal range of motion.     Cervical back: Neck supple.  Skin:    General: Skin is warm.  Neurological:     Mental Status: He is alert and oriented to person, place, and time.  Psychiatric:  Behavior: Behavior normal.        Thought Content: Thought content normal.        Judgment: Judgment normal.     Ortho Exam  Specialty Comments:  No specialty comments available.  Imaging: No results found.   PMFS History: Patient Active Problem List   Diagnosis Date Noted   Closed fracture of lateral portion of right tibial plateau 04/27/2024   Acute medial meniscus tear of right knee 04/27/2024   Tear of lateral meniscus of right knee, current  04/27/2024   Vitamin D  deficiency 12/16/2023   Elevated AST (SGOT) 12/16/2023   Abnormal CBC 12/16/2023   Positive colorectal cancer screening using Cologuard test 10/24/2023   Diabetic polyneuropathy associated with type 2 diabetes mellitus (HCC) 10/24/2023   NAFLD (nonalcoholic fatty liver disease) 40/98/1191   B12 deficiency 08/12/2023   Folate deficiency 08/12/2023   Chronic kidney disease, stage 2 (mild) 08/12/2023   Swelling of hand 06/13/2023   Pain in right hand 06/13/2023   Pain involving joints of fingers of both hands 06/13/2023   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   High risk for readmission 03/09/2023   History of stroke 03/09/2023   hypokalemia, drug induced 03/09/2023   Vertigo 03/07/2023   Obesity, Class III, BMI 40-49.9 (morbid obesity) 01/25/2023   Controlled type 2 diabetes mellitus without complication, without long-term current use of insulin  (HCC) 01/23/2023   HTN (hypertension) 01/23/2023   Hypothyroidism 01/23/2023   Gout 01/23/2023   Edema of both lower extremities 01/09/2023   Prediabetes 01/08/2023   Cervical spondylosis 10/10/2021   Positive cardiac stress test 06/28/2021   Chronic heart failure with preserved ejection fraction (HCC) 05/18/2021   Alcohol use 05/18/2021   Depression 05/18/2021   Large liver 05/18/2021   H/O medication noncompliance 05/18/2021   Mitral valve regurgitation 05/18/2021   Tricuspid valve regurgitation 05/18/2021   IDA (iron deficiency anemia) 04/08/2021   LVH (left ventricular hypertrophy) 04/08/2021   Hypomagnesemia 04/08/2021   Idiopathic chronic gout 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 08/22/2020   Lymphedema 08/22/2020   OSA (obstructive sleep apnea) 01/18/2020   Transaminitis 01/11/2020   Arthralgia of left ankle 07/06/2019   Primary localized osteoarthritis of left knee 01/18/2019   Severe obesity (BMI 35.0-39.9) with comorbidity (HCC) 10/30/2018   Acute pain of right knee 10/23/2018   Erectile dysfunction  01/06/2018   Unspecified atrial fibrillation (HCC) 08/20/2017   Diverticulosis of large intestine without hemorrhage 10/16/2014   Tubular adenoma of colon 10/16/2014   Chronic neck pain 09/14/2014   Hyperlipidemia 08/26/2014   Erectile dysfunction due to arterial insufficiency 08/26/2014   Plantar fasciitis of right foot 04/06/2013   Deformity of metatarsal 04/06/2013   Acquired equinus deformity of foot 04/06/2013   Past Medical History:  Diagnosis Date   (HFpEF) heart failure with preserved ejection fraction (HCC)    Acute on chronic heart failure (HCC) 05/16/2022   AKI (acute kidney injury) (HCC) 01/23/2023   Lab Results  Component  Value  Date     CREATININE  0.97  03/03/2023     CREATININE  1.14  02/19/2023     CREATININE  1.29 (H)  02/18/2023         Arthritis    knees. elbow, ankle   Cataract    CHF (congestive heart failure) (HCC)    Diabetes mellitus without complication (HCC)    Diastolic CHF (HCC) 02/14/2023   Dyspnea    when in A-fib   Dysrhythmia    A-fib   GERD (gastroesophageal  reflux disease)    takes Dexilant prn   History of COVID-19 05/18/2021   Formatting of this note might be different from the original. Not requiring hospitalization Formatting of this note might be different from the original. Not requiring hospitalization   Hypertension    takes Lisinopril -HCTZ daily   Hypothyroidism    Kidney injury 03/07/2023   Chronic kidney disease, stage 3a (ICD-10: N18.31): GFR 58.20 mL/min on 07/09/2023 indicates mild kidney dysfunction. Monitor closely and adjust medications as needed.            Lab Results      Component    Value    Date/Time           CREATININE    1.35    07/09/2023 02:47 PM           CREATININE    1.20    03/12/2023 12:10 PM           CREATININE    0.97    03/03/2023 02:39 PM           CREATI   Localized swelling of left lower extremity 01/11/2020   Localized swelling of right lower extremity 04/24/2020   Muscle spasm    takes Relafen  daily    OSA on CPAP 03/07/2023   does not use CPAP   Peripheral edema    Plantar fasciitis    right   Situational insomnia 01/06/2018   Sleep apnea    Sprain of deltoid ligament of left ankle 08/10/2019    No family history on file.  Past Surgical History:  Procedure Laterality Date   CARDIOVERSION N/A 02/19/2023   Procedure: CARDIOVERSION;  Surgeon: Mardell Shade, MD;  Location: [facility] ENDOSCOPY;  Service: Cardiovascular;  Laterality: N/A;   EAR CYST EXCISION N/A 08/06/2013   Procedure: CYST REMOVAL;  Surgeon: Littie Rife, MD;  Location: [facility] OR;  Service: ENT;  Laterality: N/A;   ESOPHAGOGASTRODUODENOSCOPY     left knee arthroscopy  2003   PAROTIDECTOMY Right 08/06/2013   Procedure: PAROTIDECTOMY;  Surgeon: Littie Rife, MD;  Location: [facility] OR;  Service: ENT;  Laterality: Right;   RIGHT HEART CATH N/A 02/17/2023   Procedure: RIGHT HEART CATH;  Surgeon: Mardell Shade, MD;  Location: [facility] INVASIVE CV LAB;  Service: Cardiovascular;  Laterality: N/A;   SYNOVECTOMY Left 01/26/2024   Procedure: EXTENSOR TENOSYNOVECTOMY LEFT WRIST;  Surgeon: Brunilda Capra, MD;  Location: [facility];  Service: Orthopedics;  Laterality: Left;  Regional block   TEE WITHOUT CARDIOVERSION N/A 02/19/2023   Procedure: TRANSESOPHAGEAL ECHOCARDIOGRAM (TEE);  Surgeon: Mardell Shade, MD;  Location: [facility] ENDOSCOPY;  Service: Cardiovascular;  Laterality: N/A;   TONSILLECTOMY Bilateral 08/06/2013   Procedure: TONSILLECTOMY;  Surgeon: Littie Rife, MD;  Location: [facility] OR;  Service: ENT;  Laterality: Bilateral;   Social History   Occupational History   Not on file  Tobacco Use   Smoking status: Never   Smokeless tobacco: Never  Vaping Use   Vaping status: Never Used  Substance and Sexual Activity   Alcohol use: Yes    Alcohol/week: 6.0 standard drinks of alcohol    Types: 6 Standard drinks or equivalent per week    Comment: drinks wine   Drug use: No   Sexual activity: Yes

## 2024-04-29 ENCOUNTER — Telehealth (HOSPITAL_COMMUNITY): Payer: Self-pay | Admitting: Internal Medicine

## 2024-04-29 NOTE — Telephone Encounter (Signed)
 Called to confirm/remind patient of their appointment at the [facility] on 04/29/2024.   Appointment:   [x] Confirmed  [] Left mess   [] No answer/No voice mail  [] VM Full/unable to leave message  [] Phone not in service  Patient reminded to bring all medications and/or complete list.  Confirmed patient has transportation. Gave directions, instructed to utilize valet parking.

## 2024-04-30 ENCOUNTER — Ambulatory Visit (HOSPITAL_COMMUNITY)
Admission: RE | Admit: 2024-04-30 | Discharge: 2024-04-30 | Disposition: A | Discharge status: Home / Self Care | Source: Ambulatory Visit | Attending: Internal Medicine | Admitting: Internal Medicine

## 2024-04-30 ENCOUNTER — Encounter (HOSPITAL_COMMUNITY): Payer: Self-pay | Admitting: Internal Medicine

## 2024-04-30 VITALS — BP 160/102 | HR 70 | Ht 71.0 in | Wt 240.2 lb

## 2024-04-30 DIAGNOSIS — I480 Paroxysmal atrial fibrillation: Secondary | ICD-10-CM | POA: Diagnosis not present

## 2024-04-30 DIAGNOSIS — E669 Obesity, unspecified: Secondary | ICD-10-CM | POA: Insufficient documentation

## 2024-04-30 DIAGNOSIS — I081 Rheumatic disorders of both mitral and tricuspid valves: Secondary | ICD-10-CM | POA: Diagnosis not present

## 2024-04-30 DIAGNOSIS — Z79890 Hormone replacement therapy (postmenopausal): Secondary | ICD-10-CM | POA: Diagnosis not present

## 2024-04-30 DIAGNOSIS — Z6833 Body mass index (BMI) 33.0-33.9, adult: Secondary | ICD-10-CM | POA: Diagnosis not present

## 2024-04-30 DIAGNOSIS — I10 Essential (primary) hypertension: Secondary | ICD-10-CM | POA: Diagnosis not present

## 2024-04-30 DIAGNOSIS — G4733 Obstructive sleep apnea (adult) (pediatric): Secondary | ICD-10-CM | POA: Diagnosis not present

## 2024-04-30 DIAGNOSIS — I110 Hypertensive heart disease with heart failure: Secondary | ICD-10-CM | POA: Diagnosis not present

## 2024-04-30 DIAGNOSIS — E119 Type 2 diabetes mellitus without complications: Secondary | ICD-10-CM | POA: Insufficient documentation

## 2024-04-30 DIAGNOSIS — I4819 Other persistent atrial fibrillation: Secondary | ICD-10-CM | POA: Insufficient documentation

## 2024-04-30 DIAGNOSIS — I5032 Chronic diastolic (congestive) heart failure: Secondary | ICD-10-CM | POA: Diagnosis not present

## 2024-04-30 DIAGNOSIS — I50813 Acute on chronic right heart failure: Secondary | ICD-10-CM | POA: Diagnosis not present

## 2024-04-30 DIAGNOSIS — I50812 Chronic right heart failure: Secondary | ICD-10-CM | POA: Diagnosis not present

## 2024-04-30 DIAGNOSIS — Z79899 Other long term (current) drug therapy: Secondary | ICD-10-CM | POA: Insufficient documentation

## 2024-04-30 DIAGNOSIS — Z7901 Long term (current) use of anticoagulants: Secondary | ICD-10-CM | POA: Insufficient documentation

## 2024-04-30 DIAGNOSIS — Z7984 Long term (current) use of oral hypoglycemic drugs: Secondary | ICD-10-CM | POA: Insufficient documentation

## 2024-04-30 DIAGNOSIS — I272 Pulmonary hypertension, unspecified: Secondary | ICD-10-CM | POA: Insufficient documentation

## 2024-04-30 DIAGNOSIS — E039 Hypothyroidism, unspecified: Secondary | ICD-10-CM | POA: Insufficient documentation

## 2024-04-30 LAB — BASIC METABOLIC PANEL WITH GFR
Anion gap: 18 — ABNORMAL HIGH (ref 5–15)
BUN: 6 mg/dL (ref 6–20)
CO2: 21 mmol/L — ABNORMAL LOW (ref 22–32)
Calcium: 8.9 mg/dL (ref 8.9–10.3)
Chloride: 101 mmol/L (ref 98–111)
Creatinine, Ser: 0.84 mg/dL (ref 0.61–1.24)
GFR, Estimated: >60 mL/min (ref >60)
Glucose, Bld: 94 mg/dL (ref 70–99)
Potassium: 3.4 mmol/L — ABNORMAL LOW (ref 3.5–5.1)
Sodium: 140 mmol/L (ref 135–145)

## 2024-04-30 LAB — CBC
HCT: 41.3 % (ref 39.0–52.0)
Hemoglobin: 13.8 g/dL (ref 13.0–17.0)
MCH: 27 pg (ref 26.0–34.0)
MCHC: 33.4 g/dL (ref 30.0–36.0)
MCV: 80.7 fL (ref 80.0–100.0)
Platelets: 528 10*3/uL — ABNORMAL HIGH (ref 150–400)
RBC: 5.12 MIL/uL (ref 4.22–5.81)
RDW: 14.9 % (ref 11.5–15.5)
WBC: 6.8 10*3/uL (ref 4.0–10.5)
nRBC: 0 % (ref 0.0–0.2)

## 2024-04-30 LAB — T4, FREE: Free T4: 0.69 ng/dL (ref 0.61–1.12)

## 2024-04-30 LAB — TSH: TSH: 8.089 u[IU]/mL — ABNORMAL HIGH (ref 0.350–4.500)

## 2024-04-30 LAB — BRAIN NATRIURETIC PEPTIDE: B Natriuretic Peptide: 77.7 pg/mL (ref 0.0–100.0)

## 2024-04-30 MED ORDER — AMLODIPINE BESYLATE 5 MG PO TABS: 5.0000 mg | ORAL_TABLET | Freq: Every day | ORAL | 3 refills | Status: DC

## 2024-04-30 MED ORDER — SPIRONOLACTONE 25 MG PO TABS
25.0000 mg | ORAL_TABLET | Freq: Every day | ORAL | 3 refills | Status: AC
Start: 2024-04-30 — End: ?

## 2024-04-30 NOTE — Addendum Note (Signed)
 Encounter addended by: Glorietta Lark, RN on: 04/30/2024 10:55 AM  Actions taken: Visit diagnoses modified, Order list changed, Diagnosis association updated, Clinical Note Signed, Charge Capture section accepted

## 2024-04-30 NOTE — Patient Instructions (Signed)
 Medication Changes:  INCREASE Spironolactone  to 25 mg (1 tab) Daily  START Amlodipine 5 mg Daily  Lab Work:  Labs done today, your results will be available in MyChart, we will contact you for abnormal readings.  Special Instructions // Education:  Do the following things EVERYDAY: Weigh yourself in the morning before breakfast. Write it down and keep it in a log. Take your medicines as prescribed Eat low salt foods--Limit salt (sodium) to 2000 mg per day.  Stay as active as you can everyday Limit all fluids for the day to less than 2 liters   Follow-Up in: 3 months   At the [facility], you and your health needs are our priority. We have a designated team specialized in the treatment of Heart Failure. This Care Team includes your primary Heart Failure Specialized Cardiologist (physician), Advanced Practice Providers (APPs- Physician Assistants and Nurse Practitioners), and Pharmacist who all work together to provide you with the care you need, when you need it.   You may see any of the following providers on your designated Care Team at your next follow up:  Dr. Jules Oar Dr. Peder Bourdon Dr. Alwin Baars Dr. Judyth Nunnery Nieves Bars, NP Ruddy Corral, [place] [facility] [place], [place] Dennise Fitz, NP [place] Lee, NP Luster Salters, PharmD   Please be sure to bring in all your medications bottles to every appointment.   Need to Contact Us :  If you have any questions or concerns before your next appointment please send us  a message through [place] or call our office at [phone].    TO LEAVE A MESSAGE FOR THE NURSE SELECT OPTION 2, PLEASE LEAVE A MESSAGE INCLUDING: YOUR NAME DATE OF BIRTH CALL BACK NUMBER REASON FOR CALL**this is important as we prioritize the call backs  YOU WILL RECEIVE A CALL BACK THE SAME DAY AS LONG AS YOU CALL BEFORE 4:00 PM

## 2024-04-30 NOTE — Progress Notes (Signed)
 ADVANCED HF CLINIC NOTE  PCP: Dr. Boston Byers HF Cardiologist: New  HPI:  Mr. Ratchford is a 58 y.o.male with HFpEF, DM2, atrial fibrillation, obesity and hypothyroidism. Previously followed by [facility] cardiology.    Mr. Walth was discharged from [facility] 2/24 with a fib RVR and a/c HFpEF. Diuresed well during admission however refused to stay longer and still had volume on him at d/c. Was referred to AHF TOC.    He was seen at initial TOC appt 02/14/23 and found to be markedly volume overloaded and in atrial fibrillation with RVR. He was diuresed with IV lasix  and metolazone , overall down 20 lbs. Underwent RHC showing mild pulmonary HTN with R>L heart failure with elevated filling pressures. He was placed on IV amio for afib and underwent TEE/DCCV to NSR. Drips weaned and GDMT titrated, he was discharged home weight 275 lbs.  Sleep study 4/24 showed moderate OSA, AHI 26/hr  Returns today for HF follow up. Says BP is up because he is having a lot of knee pain. (Typical SBP 130-140 at home). Otherwise feels ok. Breathing ok. No edema, orthopnea or PND. Now on Ozempic . Lost 65-70 pounds. Going to gym 3-4x/week. Has been off torsemide  for several weeks Watching fluid intake closely.   Cardiac Testing  - TEE (3/24): LVEF 55-60%, RV severely reduced, mild to mod MR, mod to severe TR, no LAA thrombus   - RHC (3/24): 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 with elevated filling pressures RA 15 PA 44/23 (33) PCW 19 CO/CI (Fick) 6.6/2.7 PVR 2.2 WU PAPi 1.4  - Echo (2/24): EF 55%, mild LVH, severe RV enlargement with moderate RV systolic dysfunction. - Echo at [facility] ([facility]Rh) (8/23): EF 45-50%, mild LVH - LHC (7/22):mild plaque in mid LAD, normal ramus, normal left circumflex, normal RCA. There was slightly sluggish flow, suspicious for microvascular dysfunction.  - Echo (7/22): EF 55%, mild LVH, RV free wall was hypokinetic, mild MVR and TV - TEE (4/19): no thrombus, s/p DCCV AFL --> NSR - Echo (9/18):  EF  50-55%, normal RV systolic function, mildly dilated LA and RA.  Past Medical History:  Diagnosis Date   (HFpEF) heart failure with preserved ejection fraction (HCC)    Acute on chronic heart failure (HCC) 05/16/2022   AKI (acute kidney injury) (HCC) 01/23/2023   Lab Results  Component  Value  Date     CREATININE  0.97  03/03/2023     CREATININE  1.14  02/19/2023     CREATININE  1.29 (H)  02/18/2023         Arthritis    knees. elbow, ankle   Cataract    CHF (congestive heart failure) (HCC)    Diabetes mellitus without complication (HCC)    Diastolic CHF (HCC) 02/14/2023   Dyspnea    when in A-fib   Dysrhythmia    A-fib   GERD (gastroesophageal reflux disease)    takes Dexilant prn   History of COVID-19 05/18/2021   Formatting of this note might be different from the original. Not requiring hospitalization Formatting of this note might be different from the original. Not requiring hospitalization   Hypertension    takes Lisinopril -HCTZ daily   Hypothyroidism    Kidney injury 03/07/2023   Chronic kidney disease, stage 3a (ICD-10: N18.31): GFR 58.20 mL/min on 07/09/2023 indicates mild kidney dysfunction. Monitor closely and adjust medications as needed.            Lab Results      Component  Value    Date/Time           CREATININE    1.35    07/09/2023 02:47 PM           CREATININE    1.20    03/12/2023 12:10 PM           CREATININE    0.97    03/03/2023 02:39 PM           CREATI   Localized swelling of left lower extremity 01/11/2020   Localized swelling of right lower extremity 04/24/2020   Muscle spasm    takes Relafen  daily   OSA on CPAP 03/07/2023   does not use CPAP   Peripheral edema    Plantar fasciitis    right   Situational insomnia 01/06/2018   Sleep apnea    Sprain of deltoid ligament of left ankle 08/10/2019   Current Outpatient Medications  Medication Sig Dispense Refill   albuterol  (VENTOLIN  HFA) 108 (90 Base) MCG/ACT inhaler Inhale 2 puffs into the lungs every  6 (six) hours as needed for shortness of breath.     allopurinol  (ZYLOPRIM ) 100 MG tablet Take 0.5 tablets (50 mg total) by mouth daily. 180 tablet 1   amiodarone  (PACERONE ) 200 MG tablet Take 100 mg by mouth daily.     augmented betamethasone dipropionate (DIPROLENE-AF) 0.05 % cream Apply topically 2 (two) times daily as needed.     Clotrimazole  1 % OINT Apply 1 Application topically 2 (two) times a week. (Patient taking differently: Apply 1 Application topically 2 (two) times a week. As needed) 30 g 11   colchicine  0.6 MG tablet Take 1 tablet by mouth daily as needed for gout flare 30 tablet 0   Cyanocobalamin  (B-12) 1000 MCG SUBL Place 1 tablet under the tongue daily at 6 (six) AM. 90 tablet 3   cyclobenzaprine  (FLEXERIL ) 10 MG tablet Take 1-2 tablets (10-20 mg total) by mouth 3 (three) times daily as needed for muscle spasms. 30 tablet 0   empagliflozin  (JARDIANCE ) 10 MG TABS tablet Take 1 tablet (10 mg total) by mouth daily. 90 tablet 3   fluticasone  (FLONASE ) 50 MCG/ACT nasal spray Place 2 sprays into both nostrils daily. (Patient taking differently: Place 2 sprays into both nostrils daily as needed for allergies.) 16 g 0   folic acid  (FOLVITE ) 1 MG tablet Take 1 tablet (1 mg total) by mouth daily as needed (low iron). 90 tablet 3   HYDROcodone -acetaminophen  (NORCO/VICODIN) 5-325 MG tablet Take 1 tablet by mouth 2 (two) times daily as needed. 14 tablet 0   HYDROmorphone  (DILAUDID ) 2 MG tablet Take by mouth.     levothyroxine  (SYNTHROID ) 50 MCG tablet Take 1 tablet (50 mcg total) by mouth daily. Needs to be adjusted every 3-6 months based on labwork if still taking amiodarone  90 tablet 3   Magnesium  Bisglycinate (MAG GLYCINATE) 100 MG TABS Take 1 tablet by mouth daily. 90 tablet 3   meclizine (ANTIVERT) 25 MG tablet Take 25 mg by mouth 3 (three) times daily as needed for dizziness.     metoprolol  succinate (TOPROL -XL) 50 MG 24 hr tablet Take 1 tablet (50 mg total) by mouth daily. 90 tablet 3    Multiple Vitamins-Minerals (MULTIVITAMIN WITH MINERALS) tablet Take 1 tablet by mouth daily.     naltrexone  (DEPADE) 50 MG tablet Take 0.5 tablets (25 mg total) by mouth daily. 90 tablet 1   potassium chloride  (KLOR-CON ) 10 MEQ tablet Take 1 tablet (10 mEq total) by mouth  daily. Take with torsemide  because torsemide  lowers potassium 90 tablet 3   rivaroxaban  (XARELTO ) 20 MG TABS tablet Take 1 tablet (20 mg total) by mouth daily with supper. 90 tablet 3   rosuvastatin  (CRESTOR ) 20 MG tablet Take 1 tablet (20 mg total) by mouth daily. Replaces 10 mg dose, can cause cramps 90 tablet 3   sacubitril -valsartan  (ENTRESTO ) 97-103 MG Take 1 tablet by mouth 2 (two) times daily. 60 tablet 11   spironolactone  (ALDACTONE ) 25 MG tablet Take 1 tablet (25 mg total) by mouth daily. Increased dose, to allow to stop torsemide  90 tablet 3   torsemide  (DEMADEX ) 20 MG tablet TAKE 1 TABLET BY MOUTH EVERY DAY (Patient taking differently: Take 20 mg by mouth as needed.) 90 tablet 1   traZODone  (DESYREL ) 50 MG tablet Take 25 mg by mouth at bedtime as needed.     No current facility-administered medications for this encounter.   Allergies  Allergen Reactions   Iodinated Contrast Media Itching   Other Rash and Other (See Comments)    Other reaction(s): Other (See Comments)  Neoprene: Rash   Social History   Socioeconomic History   Marital status: Single    Spouse name: Not on file   Number of children: Not on file   Years of education: Not on file   Highest education level: Some college, no degree  Occupational History   Not on file  Tobacco Use   Smoking status: Never   Smokeless tobacco: Never  Vaping Use   Vaping status: Never Used  Substance and Sexual Activity   Alcohol use: Yes    Alcohol/week: 6.0 standard drinks of alcohol    Types: 6 Standard drinks or equivalent per week    Comment: drinks wine   Drug use: No   Sexual activity: Yes  Other Topics Concern   Not on file  Social History  Narrative   Not on file   Social Drivers of Health   Financial Resource Strain: Low Risk  (02/02/2024)   Overall Financial Resource Strain (CARDIA)    Difficulty of Paying Living Expenses: Not very hard  Food Insecurity: Food Insecurity Present (02/02/2024)   Hunger Vital Sign    Worried About Running Out of Food in the Last Year: Sometimes true    Ran Out of Food in the Last Year: Sometimes true  Transportation Needs: No Transportation Needs (02/02/2024)   PRAPARE - Administrator, Civil Service (Medical): No    Lack of Transportation (Non-Medical): No  Physical Activity: Sufficiently Active (02/02/2024)   Exercise Vital Sign    Days of Exercise per Week: 4 days    Minutes of Exercise per Session: 50 min  Stress: Stress Concern Present (02/02/2024)   [employer] of Occupational Health - Occupational Stress Questionnaire    Feeling of Stress : To some extent  Social Connections: Socially Isolated (02/02/2024)   Social Connection and Isolation Panel [NHANES]    Frequency of Communication with Friends and Family: More than three times a week    Frequency of Social Gatherings with Friends and Family: Three times a week    Attends Religious Services: Never    Active Member of Clubs or Organizations: No    Attends Banker Meetings: Never    Marital Status: Never married  Intimate Partner Violence: Not At Risk (02/02/2024)   Humiliation, Afraid, Rape, and Kick questionnaire    Fear of Current or Ex-Partner: No    Emotionally Abused: No  Physically Abused: No    Sexually Abused: No    BP (!) 160/102   Pulse 70   Ht 5\' 11"  (1.803 m)   Wt 109 kg (240 lb 3.2 oz)   SpO2 97%   BMI 33.50 kg/m   Wt Readings from Last 3 Encounters:  04/30/24 109 kg (240 lb 3.2 oz)  04/15/24 104.3 kg (230 lb)  04/05/24 112 kg (247 lb)   PHYSICAL EXAM: General:  Obese male No resp difficulty HEENT: normal Neck: supple. no JVD. Carotids 2+ bilat; no bruits. No lymphadenopathy or  thryomegaly appreciated. Cor: PMI nondisplaced. Regular rate & rhythm. No rubs, gallops or murmurs. Lungs: clear Abdomen: obese soft, nontender, nondistended. No hepatosplenomegaly. No bruits or masses. Good bowel sounds. Extremities: no cyanosis, clubbing, rash, edema + knee brace Neuro: alert & orientedx3, cranial nerves grossly intact. moves all 4 extremities w/o difficulty. Affect pleasant  ASSESSMENT & PLAN:  1. Chronic Diastolic Heart Failure with prominent RV failue - Echo (2/24): EF 55%, mild LVH, severe RV enlargement with moderate RV systolic dysfunction. - Admitted from AHF clinic on 02/14/23 for volume overload, in setting of AF RVR.  - RHC 02/17/23: RA 15, PA 44/23 (33), CO 6.6, CI 2.8, PAPi 1.4. 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 with elevated filling pressures - NYHA II volume ok on torsemide  PRN - Continue Entresto  to 97/103 mg bid. - Increase spiro to 25 mg daily. - Continue Jardiance  10 mg daily. - Continue Toprol  XL 50 mg daily. - Labs today   2. Persistent Atrial fibrillation - on-going since 2019 - TEE (9/18) moderate size thrombus in the LAA, no DCCV.   - TEE (4/19) no LAA thrombus, DCCV --> NSR - Admit 2/24 with AF with RVR & 3/24. - s/p TEE/DCCV 3/24 - NSR today - Continue amio 100 daily (decreased due to QT prolongation) - Continue Toprol  XL 50 mg daily. - Continue Xarelto  20 mg daily.No bleeding - Amio labs today  3. HTN - BP elevated - Increase spiro  - Add amlodipine 5   4. 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  - Mild MR - TEE (3/24) showed mod/severe TR, mild to moderate MR   5. OSA - Says he's unable to tolerate CPAP mask. - Follows with Dr. Micael Adas - Has been losing weight with GLP1RA   6. DM2 - Per PCP - hgb A1C 6.1 (07/09/23) - Continue SGLT2i.& GLP1RA   7. 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 - Continue  Xarelto  + statin.   8. Obesity - Body mass index is 33.5 kg/m. - Has been losing weight with [id number]  Jules Oar, MD  10:13 AM

## 2024-05-01 LAB — T3, FREE: T3, Free: 3.3 pg/mL (ref 2.0–4.4)

## 2024-05-04 ENCOUNTER — Ambulatory Visit: Admitting: Orthopaedic Surgery

## 2024-05-04 NOTE — Progress Notes (Signed)
 Office Visit Note  Patient: Parker [name]             Date of Birth: [birth year]           MRN: [id number]             PCP: Anthon Kins, MD Referring: Anthon Kins, MD Visit Date: 05/05/2024   Subjective:  New Patient (Initial Visit) (Patient states sometimes his big toes, fingers, and feet will hurt. Patient states he was sent her for gout. )   Discussed the use of AI scribe software for clinical note transcription with the patient, who gave verbal consent to proceed.  History of Present Illness   Parker [name] is a 58 year old male with gout who presents for management of recurrent gout flares.  He reports recurrent gout flares first diagnosed around 2018-2019. These flares occur every two to three months, typically affecting his fingers, big toes, or one foot, causing significant pain and inability to walk. The pain is extremely severe, impacting his ability to perform daily activities. He manages his condition with medications, including allopurinol  and colchicine , but has been taking them on an as-needed basis. He is unable to take NSAIDs due to his multiple medications for CHF. Tylenol  is only marginally effective. He has taken steroids on occasions but describes a feeling of muscle tearing or pulling.  Approximately one week ago, he experienced severe knee pain, which was sudden in onset after returning from the grocery store. He described the pain as feeling like 'somebody shot me in my knee,' which was so intense that he was unable to walk and required ambulance transport. He was informed that he has a degenerative tear in the knee.  His social history includes alcohol use, which he acknowledges as a contributing factor to his elevated uric acid levels. He also mentions dietary factors such as seafood and organ meats, which he has been trying to limit. He is currently taking allopurinol  at a dose of 300 mg and colchicine  as needed. He also takes other medications for  heart-related issues, including diuretics, which may affect his gout management.    Labs reviewed 12/2023 Uric acid 9.1    Activities of Daily Living:  Patient reports morning stiffness for 20 minutes.   Patient Reports nocturnal pain.  Difficulty dressing/grooming: Denies Difficulty climbing stairs: Reports Difficulty getting out of chair: Reports Difficulty using hands for taps, buttons, cutlery, and/or writing: Denies  Review of Systems  Constitutional:  Positive for fatigue.  HENT:  Negative for mouth sores and mouth dryness.   Eyes:  Negative for dryness.  Respiratory:  Negative for shortness of breath.   Cardiovascular:  Negative for chest pain and palpitations.  Gastrointestinal:  Negative for blood in stool, constipation and diarrhea.  Endocrine: Negative for increased urination.  Genitourinary:  Negative for involuntary urination.  Musculoskeletal:  Positive for joint pain, gait problem, joint pain, joint swelling, myalgias, morning stiffness and myalgias. Negative for muscle weakness and muscle tenderness.  Skin:  Negative for color change, rash, hair loss and sensitivity to sunlight.  Allergic/Immunologic: Negative for susceptible to infections.  Neurological:  Negative for dizziness and headaches.  Hematological:  Negative for swollen glands.  Psychiatric/Behavioral:  Positive for depressed mood and sleep disturbance. The patient is nervous/anxious.     PMFS History:  Patient Active Problem List   Diagnosis Date Noted   Closed fracture of lateral portion of right tibial plateau 04/27/2024   Acute medial meniscus tear of right  knee 04/27/2024   Tear of lateral meniscus of right knee, current 04/27/2024   Vitamin D  deficiency 12/16/2023   Elevated AST (SGOT) 12/16/2023   Abnormal CBC 12/16/2023   Positive colorectal cancer screening using Cologuard test 10/24/2023   Diabetic polyneuropathy associated with type 2 diabetes mellitus (HCC) 10/24/2023   NAFLD  (nonalcoholic fatty liver disease) 04/54/0981   B12 deficiency 08/12/2023   Folate deficiency 08/12/2023   Chronic kidney disease, stage 2 (mild) 08/12/2023   Swelling of hand 06/13/2023   Pain in right hand 06/13/2023   Pain involving joints of fingers of both hands 06/13/2023   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   High risk for readmission 03/09/2023   History of stroke 03/09/2023   hypokalemia, drug induced 03/09/2023   Vertigo 03/07/2023   Obesity, Class III, BMI 40-49.9 (morbid obesity) 01/25/2023   Controlled type 2 diabetes mellitus without complication, without long-term current use of insulin  (HCC) 01/23/2023   HTN (hypertension) 01/23/2023   Hypothyroidism 01/23/2023   Gout 01/23/2023   Edema of both lower extremities 01/09/2023   Prediabetes 01/08/2023   Cervical spondylosis 10/10/2021   Positive cardiac stress test 06/28/2021   Chronic heart failure with preserved ejection fraction (HCC) 05/18/2021   Alcohol use 05/18/2021   Depression 05/18/2021   Large liver 05/18/2021   H/O medication noncompliance 05/18/2021   Mitral valve regurgitation 05/18/2021   Tricuspid valve regurgitation 05/18/2021   IDA (iron deficiency anemia) 04/08/2021   LVH (left ventricular hypertrophy) 04/08/2021   Hypomagnesemia 04/08/2021   Idiopathic chronic gout 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 08/22/2020   Lymphedema 08/22/2020   OSA (obstructive sleep apnea) 01/18/2020   Transaminitis 01/11/2020   Arthralgia of left ankle 07/06/2019   Primary localized osteoarthritis of left knee 01/18/2019   Severe obesity (BMI 35.0-39.9) with comorbidity (HCC) 10/30/2018   Acute pain of right knee 10/23/2018   Erectile dysfunction 01/06/2018   Unspecified atrial fibrillation (HCC) 08/20/2017   Diverticulosis of large intestine without hemorrhage 10/16/2014   Tubular adenoma of colon 10/16/2014   Chronic neck pain 09/14/2014   Hyperlipidemia 08/26/2014   Erectile dysfunction due to arterial  insufficiency 08/26/2014   Plantar fasciitis of right foot 04/06/2013   Deformity of metatarsal 04/06/2013   Acquired equinus deformity of foot 04/06/2013    Past Medical History:  Diagnosis Date   (HFpEF) heart failure with preserved ejection fraction (HCC)    Acute on chronic heart failure (HCC) 05/16/2022   AKI (acute kidney injury) (HCC) 01/23/2023   Lab Results  Component  Value  Date     CREATININE  0.97  03/03/2023     CREATININE  1.14  02/19/2023     CREATININE  1.29 (H)  02/18/2023         Arthritis    knees. elbow, ankle   Cataract    CHF (congestive heart failure) (HCC)    Diabetes mellitus without complication (HCC)    Diastolic CHF (HCC) 02/14/2023   Dyspnea    when in A-fib   Dysrhythmia    A-fib   GERD (gastroesophageal reflux disease)    takes Dexilant prn   History of COVID-19 05/18/2021   Formatting of this note might be different from the original. Not requiring hospitalization Formatting of this note might be different from the original. Not requiring hospitalization   Hypertension    takes Lisinopril -HCTZ daily   Hypothyroidism    Kidney injury 03/07/2023   Chronic kidney disease, stage 3a (ICD-10: N18.31): GFR 58.20 mL/min on 07/09/2023 indicates mild  kidney dysfunction. Monitor closely and adjust medications as needed.            Lab Results      Component    Value    Date/Time           CREATININE    1.35    07/09/2023 02:47 PM           CREATININE    1.20    03/12/2023 12:10 PM           CREATININE    0.97    03/03/2023 02:39 PM           CREATI   Localized swelling of left lower extremity 01/11/2020   Localized swelling of right lower extremity 04/24/2020   Muscle spasm    takes Relafen  daily   OSA on CPAP 03/07/2023   does not use CPAP   Peripheral edema    Plantar fasciitis    right   Situational insomnia 01/06/2018   Sleep apnea    Sprain of deltoid ligament of left ankle 08/10/2019   Torn meniscus    right knee    History reviewed. No  pertinent family history. Past Surgical History:  Procedure Laterality Date   CARDIOVERSION N/A 02/19/2023   Procedure: CARDIOVERSION;  Surgeon: Mardell Shade, MD;  Location: [facility];  Service: Cardiovascular;  Laterality: N/A;   EAR CYST EXCISION N/A 08/06/2013   Procedure: CYST REMOVAL;  Surgeon: Littie Rife, MD;  Location: [facility];  Service: ENT;  Laterality: N/A;   ESOPHAGOGASTRODUODENOSCOPY     left knee arthroscopy  2003   PAROTIDECTOMY Right 08/06/2013   Procedure: PAROTIDECTOMY;  Surgeon: Littie Rife, MD;  Location: [facility] OR;  Service: ENT;  Laterality: Right;   RIGHT HEART CATH N/A 02/17/2023   Procedure: RIGHT HEART CATH;  Surgeon: Mardell Shade, MD;  Location: [facility];  Service: Cardiovascular;  Laterality: N/A;   SYNOVECTOMY Left 01/26/2024   Procedure: EXTENSOR TENOSYNOVECTOMY LEFT WRIST;  Surgeon: Brunilda Capra, MD;  Location: [facility];  Service: Orthopedics;  Laterality: Left;  Regional block   TEE WITHOUT CARDIOVERSION N/A 02/19/2023   Procedure: TRANSESOPHAGEAL ECHOCARDIOGRAM (TEE);  Surgeon: Mardell Shade, MD;  Location: [facility] ENDOSCOPY;  Service: Cardiovascular;  Laterality: N/A;   TONSILLECTOMY Bilateral 08/06/2013   Procedure: TONSILLECTOMY;  Surgeon: Littie Rife, MD;  Location: [facility] OR;  Service: ENT;  Laterality: Bilateral;   Social History   Social History Narrative   Not on file   Immunization History  Administered Date(s) Administered   Influenza Inj Mdck Quad Pf 08/26/2014   Influenza, Quadrivalent, Recombinant, Inj, Pf 10/30/2018, 09/30/2019   Influenza-Unspecified 08/26/2014, 10/30/2018, 09/30/2019, 10/03/2023   Pneumococcal Polysaccharide-23 06/28/2021   Tdap 08/26/2014     Objective: Vital Signs: BP (!) 161/118 (BP Location: Right Arm, Patient Position: Sitting, Cuff Size: Large)   Pulse 66   Resp 14   Ht 5' 11 (1.803 m)   Wt 238 lb (108 kg)   BMI 33.19 kg/m    Physical Exam  Eyes:      Conjunctiva/sclera: Conjunctivae normal.    Cardiovascular:     Rate and Rhythm: Normal rate and regular rhythm.  Pulmonary:     Effort: Pulmonary effort is normal.     Breath sounds: Normal breath sounds.   Skin:    General: Skin is warm and dry.     Findings: No rash.   Neurological:     Mental Status: He is alert.   Psychiatric:  Mood and Affect: Mood normal.      Musculoskeletal Exam:  Shoulders full ROM no tenderness or swelling Elbows full ROM no tenderness or swelling Left wrist postsurgical changes Fingers full ROM no tenderness or swelling Right knee in brace painful with active and passive movement and pressure Ankles full ROM no tenderness or swelling MTPs full ROM no tenderness or swelling    Investigation: No additional findings.  Imaging: MR Knee Right w/o contrast Result Date: 04/22/2024 CLINICAL DATA:  Right knee pain for 4 days. EXAM: MRI OF THE RIGHT KNEE WITHOUT CONTRAST TECHNIQUE: Multiplanar, multisequence MR imaging of the knee was performed. No intravenous contrast was administered. COMPARISON:  Right knee radiographs 04/15/2024 FINDINGS: Despite efforts by the technologist and patient, moderate to high-grade motion artifact is present on today's exam and could not be eliminated. This reduces exam sensitivity and specificity. MENISCI Medial meniscus: Complex tear of the root of the posterior horn of the medial meniscus (sagittal image 18 and coronal image 24) with defects at the superior and inferior articular surfaces, greatest at the central third of meniscal triangle. Moderate attenuation of the body of the medial meniscus with an oblique tear extending from the inferior aspect of the medial wall through the inferior articular surface of the peripheral third of the meniscal triangle. Lateral meniscus: Oblique tear extending through the inferior articular surface of the middle and central thirds of the meniscal triangle of the anterior horn of the  lateral meniscus (sagittal series 10, image 10). LIGAMENTS Cruciates: The ACL and PCL are intact. Collaterals: The medial collateral ligament is grossly intact. The fibular collateral ligament, biceps femoris tendon, iliotibial band, and popliteus tendon are grossly intact. Motion artifact limits evaluation. CARTILAGE Patellofemoral: Grossly moderate patellofemoral cartilage thinning, greatest within the patellar facets. Medial: Grossly high-grade partial thickness cartilage loss within the slightly lateral aspect of the weight-bearing medial femoral condyle. There is a marrow fat intensity 6 mm loose body within the posterolateral aspect of the medial knee compartment as seen on prior radiographs (coronal series 108, image 23). Lateral:  No definite cartilage defect is seen. Joint: Moderate to large joint effusion. Normal Hoffa's fat pad. No plical thickening. Popliteal Fossa: Moderate Baker's cyst measuring up to approximately 2.0 x 4.1 x 6.1 cm (transverse by AP by craniocaudal). Extensor Mechanism:  Intact quadriceps tendon and patellar tendon. Bones: There is curvilinear decreased T1 increased T2 signal within the mid to posterior aspect of the lateral tibial plateau, contacting the articular surface of the mid AP dimension (sagittal series 111, image 9) and the posterior wall of the proximal tibia (sagittal image 10 and coronal image 25). This fracture also contacts the proximal lateral tibial wall. No overlying cortical depression. Other: Moderate diffuse subcutaneous fat edema and swelling. IMPRESSION: 1. This study is moderately to markedly limited by patient motion artifact. 2. Acute nondisplaced fracture of the mid to posterior aspect of the lateral tibial plateau, contacting the articular surface of the mid AP dimension and the posterior wall of the proximal tibia. No overlying cortical depression. 3. Complex tear of the root of the posterior horn of the medial meniscus. Moderate attenuation of the body  of the medial meniscus with an oblique tear extending from the inferior aspect of the medial wall through the inferior articular surface of the peripheral third of the remaining meniscal triangle. 4. Oblique undersurface tear of the anterior horn of the lateral meniscus. 5. Moderate to large joint effusion. Moderate Baker's cyst. These results will be called to the ordering  clinician or representative by the Radiologist Assistant, and communication documented in the PACS or [employer]. Electronically Signed   By: Bertina Broccoli M.D.   On: 04/22/2024 17:50    Recent Labs: Lab Results  Component Value Date   WBC 6.8 04/30/2024   HGB 13.8 04/30/2024   PLT 528 (H) 04/30/2024   NA 140 04/30/2024   K 3.4 (L) 04/30/2024   CL 101 04/30/2024   CO2 21 (L) 04/30/2024   GLUCOSE 94 04/30/2024   BUN 6 04/30/2024   CREATININE 0.84 04/30/2024   BILITOT 0.6 12/15/2023   ALKPHOS 78 12/15/2023   AST 90 (H) 12/15/2023   ALT 38 12/15/2023   PROT 7.7 12/15/2023   ALBUMIN  4.1 12/15/2023   CALCIUM  8.9 04/30/2024   GFRAA >90 08/06/2013    Speciality Comments: No specialty comments available.  Procedures:  No procedures performed Allergies: Iodinated contrast media and Other   Assessment / Plan:     Visit Diagnoses: Other secondary chronic gout 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 - Plan: colchicine  0.6 MG tablet, allopurinol  (ZYLOPRIM ) 300 MG tablet, Uric acid, Sedimentation rate, Comprehensive metabolic panel with GFR Chronic gout with frequent flares due to inadequately controlled uric acid levels. Contributing factors include alcohol, dietary intake, diuretics, and CKD stage 2. Current management with inconsistent allopurinol  and colchicine  use. Goal: maintain uric acid <6 mg/dL. - Prescribe allopurinol  300 mg once daily. - Prescribe colchicine  0.6 mg once daily for three months. - Provide dietary guidance to reduce alcohol, shellfish, and organ meats. - Schedule blood test in one month for uric  acid levels. - Consider increasing allopurinol  or switching medications if uric acid >6 mg/dL. - Educated on medication adherence and dietary modifications.  Closed fracture of lateral portion of right tibial plateau with routine healing, subsequent encounter - Monitor knee condition and consider surgical intervention if symptoms worsen.   Chronic kidney disease stage 2 CKD stage 2 reduces uric acid clearance, exacerbating gout. - Monitor kidney function regularly to assess impact on uric acid clearance and adjust gout management.    Orders: Orders Placed This Encounter  Procedures   Uric acid   Sedimentation rate   Comprehensive metabolic panel with GFR   Meds ordered this encounter  Medications   colchicine  0.6 MG tablet    Sig: Take 1/2 tablet by mouth daily or increase to 1 tablet for up to 5 days for flare    Dispense:  45 tablet    Refill:  0   allopurinol  (ZYLOPRIM ) 300 MG tablet    Sig: Take 1 tablet (300 mg total) by mouth daily.    Dispense:  90 tablet    Refill:  0     Follow-Up Instructions: Return in about 3 months (around 08/05/2024) for New pt gout allopurinol  f/u 3mos.   Matt Song, MD  Note - This record has been created using [employer].  Chart creation errors have been sought, but may not always  have been located. Such creation errors do not reflect on  the standard of medical care.

## 2024-05-05 ENCOUNTER — Encounter: Payer: Self-pay | Admitting: Internal Medicine

## 2024-05-05 ENCOUNTER — Ambulatory Visit: Payer: PPO | Attending: Internal Medicine | Admitting: Internal Medicine

## 2024-05-05 VITALS — BP 161/118 | HR 66 | Resp 14 | Ht 71.0 in | Wt 238.0 lb

## 2024-05-05 DIAGNOSIS — M1A49X0 Other secondary chronic gout, multiple sites, without tophus (tophi): Secondary | ICD-10-CM

## 2024-05-05 DIAGNOSIS — M1A9XX1 Chronic gout, unspecified, with tophus (tophi): Secondary | ICD-10-CM

## 2024-05-05 DIAGNOSIS — S82121D Displaced fracture of lateral condyle of right tibia, subsequent encounter for closed fracture with routine healing: Secondary | ICD-10-CM | POA: Diagnosis not present

## 2024-05-05 MED ORDER — COLCHICINE 0.6 MG PO TABS: ORAL_TABLET | ORAL | 0 refills | Status: AC

## 2024-05-05 MED ORDER — ALLOPURINOL 300 MG PO TABS: 300.0000 mg | ORAL_TABLET | Freq: Every day | ORAL | 0 refills | Status: AC

## 2024-05-05 NOTE — Patient Instructions (Signed)

## 2024-05-06 ENCOUNTER — Ambulatory Visit (HOSPITAL_COMMUNITY): Payer: Self-pay | Admitting: Internal Medicine

## 2024-05-06 DIAGNOSIS — T50905A Adverse effect of unspecified drugs, medicaments and biological substances, initial encounter: Secondary | ICD-10-CM

## 2024-05-12 ENCOUNTER — Telehealth: Payer: Self-pay

## 2024-05-12 NOTE — Telephone Encounter (Signed)
 Sent pt mychart message per provider to go to the pharmacy they could transfer his meds to the one he is close to.  Copied from CRM [id number]. Topic: General - Other >> May 12, 2024  4:00 PM Chuck Crater wrote: Advised patient of Dr. Boston Byers message that he can [facility] to any [facility] around [facility] to have it transferred and get a short script.

## 2024-05-12 NOTE — Telephone Encounter (Signed)
 Copied from CRM [id number]. Topic: General - Other >> May 12, 2024 10:42 AM Parker [name] wrote: Reason for CRM: patient called stating he left hs medication at home and he need temporary medication because he is out of town. The medication is rivaroxaban  (XARELTO ) 20 MG TABS tablet and amiodarone  (PACERONE ) 200 MG tablet [facility] [place] on [address]  CB [phone]

## 2024-05-13 MED ORDER — POTASSIUM CHLORIDE ER 10 MEQ PO TBCR: 20.0000 meq | EXTENDED_RELEASE_TABLET | Freq: Every day | ORAL | 3 refills | Status: AC

## 2024-05-17 ENCOUNTER — Telehealth: Payer: Self-pay | Admitting: Orthopaedic Surgery

## 2024-05-17 NOTE — Telephone Encounter (Signed)
 I called and spoke with patient. He has been having increased pain for the past week. He only has Tylenol  to take, and that does not help at all. He already has a follow up scheduled for next Tuesday the 24th.  I told him that you were operating today, but would be in touch as soon as I know something.

## 2024-05-17 NOTE — Telephone Encounter (Signed)
 Pt called stating he need a call from Corwin Dings or Julius Ohs. He states knee is still swelling and sever pains. Pt states he can't take it any more. Please call pt at [phone].

## 2024-05-18 MED ORDER — IBUPROFEN 800 MG PO TABS: 800.0000 mg | ORAL_TABLET | Freq: Three times a day (TID) | ORAL | 2 refills | Status: DC | PRN

## 2024-05-18 MED ORDER — TRAMADOL HCL 50 MG PO TABS: 50.0000 mg | ORAL_TABLET | Freq: Every day | ORAL | 0 refills | Status: AC | PRN

## 2024-05-18 NOTE — Telephone Encounter (Signed)
 I sent tramadol  and motrin

## 2024-05-19 NOTE — Telephone Encounter (Signed)
 Called. No answer. LMOM that medication was sent to pharmacy yesterday evening.

## 2024-05-24 ENCOUNTER — Telehealth: Payer: Self-pay | Admitting: Internal Medicine

## 2024-05-24 NOTE — Telephone Encounter (Signed)
 Patient dropped off document [employer] Form, to be filled out by provider. Patient requested to send it back via Fax within 5-days. Document is located in providers tray at front office.Please advise at Mobile [phone] (mobile)   Patient wants to pick up original once faxed

## 2024-05-25 ENCOUNTER — Ambulatory Visit: Admitting: Orthopaedic Surgery

## 2024-05-26 NOTE — Telephone Encounter (Signed)
 LVM for Patient to schedule appointment with PCP

## 2024-06-07 ENCOUNTER — Ambulatory Visit: Payer: Self-pay | Admitting: *Deleted

## 2024-06-07 NOTE — Telephone Encounter (Signed)
 FYI Only or Action Required?: FYI only for provider.  Patient was last seen in primary care on 02/27/2024 by Lucius Krabbe, NP. Called Nurse Triage reporting medication question. Symptoms began several months ago. Interventions attempted: prescribed medications: allopurinol , colchicine    gradually improving.  Triage Disposition: Information or Advice Only Call  Patient/caregiver understands and will follow disposition?: Yes   Reason for Disposition  Caller has medicine question only, adult not sick, AND triager answers question  Answer Assessment - Initial Assessment Questions 1. NAME of MEDICINE: What medicine(s) are you calling about?     colchicine  0.6 MG tablet- most common dose for gout (May be prescribed more than one tablet) 2. QUESTION: What is your question? (e.g., double dose of medicine, side effect)     Patient wants to know if there is increased dose 3. PRESCRIBER: Who prescribed the medicine? Reason: if prescribed by specialist, call should be referred to that group.     Rheumatology  4. SYMPTOMS: Do you have any symptoms? If Yes, ask: What symptoms are you having?  How bad are the symptoms (e.g., mild, moderate, severe)     Patient reports his symptoms are better and he has improved- he had read that he needs to take colchicine  daily for 30 days.    Patient advised to follow his provider instruction- the Allopurinol  dose is daily and the colchicine  is for short term flare- hopefully he will need it less and less-he will also get labs as directed to make sure his uric acid levels are decreasing. Patient has appointment with PCP 06/09/24 and will follow up with any questions.  Protocols used: Medication Question Call-A-AH    Copied from CRM [id number]. Topic: Clinical - Medication Question >> Jun 07, 2024 10:25 AM Gustabo D wrote:  colchicine  0.6 MG tablet-  Patient wants to know if there is a stronger dosage he can take  Dosing: Directions: Take 1/2 tablet  by mouth daily or increase to 1 tablet for up to 5 days for flare

## 2024-06-07 NOTE — Telephone Encounter (Signed)
 Noted

## 2024-06-08 ENCOUNTER — Ambulatory Visit: Admitting: Orthopaedic Surgery

## 2024-06-08 ENCOUNTER — Other Ambulatory Visit (INDEPENDENT_AMBULATORY_CARE_PROVIDER_SITE_OTHER): Payer: Self-pay

## 2024-06-08 DIAGNOSIS — S82121A Displaced fracture of lateral condyle of right tibia, initial encounter for closed fracture: Secondary | ICD-10-CM | POA: Diagnosis not present

## 2024-06-08 DIAGNOSIS — S83281A Other tear of lateral meniscus, current injury, right knee, initial encounter: Secondary | ICD-10-CM

## 2024-06-08 DIAGNOSIS — S83241A Other tear of medial meniscus, current injury, right knee, initial encounter: Secondary | ICD-10-CM | POA: Diagnosis not present

## 2024-06-08 MED ORDER — HYDROCODONE-ACETAMINOPHEN 5-325 MG PO TABS: 1.0000 | ORAL_TABLET | Freq: Every day | ORAL | 0 refills | Status: AC | PRN

## 2024-06-08 NOTE — Progress Notes (Signed)
 Office Visit Note   Patient: Parker [name]           Date of Birth: 1966/05/03           MRN: [id number] Visit Date: 06/08/2024              Requested by: Jesus Bernardino MATSU, MD [address] [place],  [place] [address] PCP: Jesus Bernardino MATSU, MD   Assessment & Plan: Visit Diagnoses:  1. Closed fracture of lateral portion of right tibial plateau, initial encounter   2. Acute medial meniscus tear of right knee, initial encounter   3. Tear of lateral meniscus of right knee, current, unspecified tear type, initial encounter     Plan: History of Present Illness Parker [name] is a 58 year old male who presents for follow-up of a tibia fracture.  Approximately six weeks post-injury, he sustained a leg injury on May 15, initially understood to be a tibia fracture, later described as a deep bone bruise or impaction fracture. Current pain level is three to four out of ten, with episodes of severe pain every two to three weeks lasting about 72 hours, during which he is unable to perform usual activities and relies on a brace and rest for relief. He has run out of hydrocodone  and is currently taking Tylenol , which is ineffective for severe pain.  He has good range of motion overall but experiences increased knee pain with activity, such as speed walking. The knee feels better after rest but worsens by evening after activity. He is cautious about increasing activity level too quickly.  He has a history of vertigo, which has led to falls in the past, and is concerned about falling again due to the pain it could cause. He prefers to manage leg strength independently through exercises like leg extensions rather than pursuing physical therapy.   Assessment and Plan Fracture healing well on X-ray. Intermittent severe pain persists, Tylenol  ineffective during episodes. Prefers self-directed exercises over physical therapy. - Refilled hydrocodone  for severe pain, advised sparing use. - Advised  gradual increase in activity based on comfort. - Advised against speed walking. - Discussed self-directed leg strengthening exercises.  Vertigo Experiences vertigo leading to falls, concerned about exacerbating tibia fracture pain.  Follow-Up Instructions: No follow-ups on file.   Orders:  Orders Placed This Encounter  Procedures   XR Knee 1-2 Views Right   Meds ordered this encounter  Medications   HYDROcodone -acetaminophen  (NORCO/VICODIN) 5-325 MG tablet    Sig: Take 1 tablet by mouth daily as needed.    Dispense:  10 tablet    Refill:  0      Procedures: No procedures performed   Clinical Data: No additional findings.   Subjective: Chief Complaint  Patient presents with   Right Knee - Follow-up    DOI: 04/15/24 RT Knee tibial plateau fracture    HPI  Review of Systems  Constitutional: Negative.   HENT: Negative.    Eyes: Negative.   Respiratory: Negative.    Cardiovascular: Negative.   Gastrointestinal: Negative.   Endocrine: Negative.   Genitourinary: Negative.   Skin: Negative.   Allergic/Immunologic: Negative.   Neurological: Negative.   Hematological: Negative.   Psychiatric/Behavioral: Negative.    All other systems reviewed and are negative.    Objective: Vital Signs: There were no vitals taken for this visit.  Physical Exam Vitals and nursing note reviewed.  Constitutional:      Appearance: He is well-developed.  Pulmonary:  Effort: Pulmonary effort is normal.  Abdominal:     Palpations: Abdomen is soft.  Skin:    General: Skin is warm.  Neurological:     Mental Status: He is alert and oriented to person, place, and time.  Psychiatric:        Behavior: Behavior normal.        Thought Content: Thought content normal.        Judgment: Judgment normal.     Ortho Exam  Specialty Comments:  No specialty comments available.  Imaging: XR Knee 1-2 Views Right Result Date: 06/08/2024 X-rays of the right knee show no acute  findings.    PMFS History: Patient Active Problem List   Diagnosis Date Noted   Closed fracture of lateral portion of right tibial plateau 04/27/2024   Acute medial meniscus tear of right knee 04/27/2024   Tear of lateral meniscus of right knee, current 04/27/2024   Vitamin D  deficiency 12/16/2023   Elevated AST (SGOT) 12/16/2023   Abnormal CBC 12/16/2023   Positive colorectal cancer screening using Cologuard test 10/24/2023   Diabetic polyneuropathy associated with type 2 diabetes mellitus (HCC) 10/24/2023   NAFLD (nonalcoholic fatty liver disease) 90/89/7975   B12 deficiency 08/12/2023   Folate deficiency 08/12/2023   Chronic kidney disease, stage 2 (mild) 08/12/2023   Swelling of hand 06/13/2023   Pain in right hand 06/13/2023   Pain involving joints of fingers of both hands 06/13/2023   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   High risk for readmission 03/09/2023   History of stroke 03/09/2023   hypokalemia, drug induced 03/09/2023   Vertigo 03/07/2023   Obesity, Class III, BMI 40-49.9 (morbid obesity) 01/25/2023   Controlled type 2 diabetes mellitus without complication, without long-term current use of insulin  (HCC) 01/23/2023   HTN (hypertension) 01/23/2023   Hypothyroidism 01/23/2023   Gout 01/23/2023   Edema of both lower extremities 01/09/2023   Prediabetes 01/08/2023   Cervical spondylosis 10/10/2021   Positive cardiac stress test 06/28/2021   Chronic heart failure with preserved ejection fraction (HCC) 05/18/2021   Alcohol use 05/18/2021   Depression 05/18/2021   Large liver 05/18/2021   H/O medication noncompliance 05/18/2021   Mitral valve regurgitation 05/18/2021   Tricuspid valve regurgitation 05/18/2021   IDA (iron deficiency anemia) 04/08/2021   LVH (left ventricular hypertrophy) 04/08/2021   Hypomagnesemia 04/08/2021   Idiopathic chronic gout 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 08/22/2020   Lymphedema 08/22/2020   OSA (obstructive sleep apnea) 01/18/2020    Transaminitis 01/11/2020   Arthralgia of left ankle 07/06/2019   Primary localized osteoarthritis of left knee 01/18/2019   Severe obesity (BMI 35.0-39.9) with comorbidity (HCC) 10/30/2018   Acute pain of right knee 10/23/2018   Erectile dysfunction 01/06/2018   Unspecified atrial fibrillation (HCC) 08/20/2017   Diverticulosis of large intestine without hemorrhage 10/16/2014   Tubular adenoma of colon 10/16/2014   Chronic neck pain 09/14/2014   Hyperlipidemia 08/26/2014   Erectile dysfunction due to arterial insufficiency 08/26/2014   Plantar fasciitis of right foot 04/06/2013   Deformity of metatarsal 04/06/2013   Acquired equinus deformity of foot 04/06/2013   Past Medical History:  Diagnosis Date   (HFpEF) heart failure with preserved ejection fraction (HCC)    Acute on chronic heart failure (HCC) 05/16/2022   AKI (acute kidney injury) (HCC) 01/23/2023   Lab Results  Component  Value  Date     CREATININE  0.97  03/03/2023     CREATININE  1.14  02/19/2023     CREATININE  1.29 (H)  02/18/2023         Arthritis    knees. elbow, ankle   Cataract    CHF (congestive heart failure) (HCC)    Diabetes mellitus without complication (HCC)    Diastolic CHF (HCC) 02/14/2023   Dyspnea    when in A-fib   Dysrhythmia    A-fib   GERD (gastroesophageal reflux disease)    takes Dexilant prn   History of COVID-19 05/18/2021   Formatting of this note might be different from the original. Not requiring hospitalization Formatting of this note might be different from the original. Not requiring hospitalization   Hypertension    takes Lisinopril -HCTZ daily   Hypothyroidism    Kidney injury 03/07/2023   Chronic kidney disease, stage 3a (ICD-10: N18.31): GFR 58.20 mL/min on 07/09/2023 indicates mild kidney dysfunction. Monitor closely and adjust medications as needed.            Lab Results      Component    Value    Date/Time           CREATININE    1.35    07/09/2023 02:47 PM            CREATININE    1.20    03/12/2023 12:10 PM           CREATININE    0.97    03/03/2023 02:39 PM           CREATI   Localized swelling of left lower extremity 01/11/2020   Localized swelling of right lower extremity 04/24/2020   Muscle spasm    takes Relafen  daily   OSA on CPAP 03/07/2023   does not use CPAP   Peripheral edema    Plantar fasciitis    right   Situational insomnia 01/06/2018   Sleep apnea    Sprain of deltoid ligament of left ankle 08/10/2019   Torn meniscus    right knee    No family history on file.  Past Surgical History:  Procedure Laterality Date   CARDIOVERSION N/A 02/19/2023   Procedure: CARDIOVERSION;  Surgeon: Cherrie Toribio SAUNDERS, MD;  Location: [facility] ENDOSCOPY;  Service: Cardiovascular;  Laterality: N/A;   EAR CYST EXCISION N/A 08/06/2013   Procedure: CYST REMOVAL;  Surgeon: Merilee Kraft, MD;  Location: [facility] OR;  Service: ENT;  Laterality: N/A;   ESOPHAGOGASTRODUODENOSCOPY     left knee arthroscopy  2003   PAROTIDECTOMY Right 08/06/2013   Procedure: PAROTIDECTOMY;  Surgeon: Merilee Kraft, MD;  Location: [facility] OR;  Service: ENT;  Laterality: Right;   RIGHT HEART CATH N/A 02/17/2023   Procedure: RIGHT HEART CATH;  Surgeon: Cherrie Toribio SAUNDERS, MD;  Location: [facility] INVASIVE CV LAB;  Service: Cardiovascular;  Laterality: N/A;   SYNOVECTOMY Left 01/26/2024   Procedure: EXTENSOR TENOSYNOVECTOMY LEFT WRIST;  Surgeon: Murrell Drivers, MD;  Location: [facility];  Service: Orthopedics;  Laterality: Left;  Regional block   TEE WITHOUT CARDIOVERSION N/A 02/19/2023   Procedure: TRANSESOPHAGEAL ECHOCARDIOGRAM (TEE);  Surgeon: Cherrie Toribio SAUNDERS, MD;  Location: [facility] ENDOSCOPY;  Service: Cardiovascular;  Laterality: N/A;   TONSILLECTOMY Bilateral 08/06/2013   Procedure: TONSILLECTOMY;  Surgeon: Merilee Kraft, MD;  Location[facility] OR;  Service: ENT;  Laterality: Bilateral;   Social History   Occupational History   Not on file  Tobacco Use   Smoking status: Never    Passive  exposure: Past   Smokeless tobacco: Never  Vaping Use   Vaping status: Never Used  Substance and Sexual  Activity   Alcohol use: Yes    Alcohol/week: 6.0 standard drinks of alcohol    Types: 6 Standard drinks or equivalent per week    Comment: drinks wine   Drug use: No   Sexual activity: Yes

## 2024-06-09 ENCOUNTER — Ambulatory Visit (INDEPENDENT_AMBULATORY_CARE_PROVIDER_SITE_OTHER): Admitting: Internal Medicine

## 2024-06-09 ENCOUNTER — Encounter: Payer: Self-pay | Admitting: Internal Medicine

## 2024-06-09 VITALS — BP 138/88 | HR 62 | Temp 98.0°F | Ht 71.0 in | Wt 236.6 lb

## 2024-06-09 DIAGNOSIS — E1142 Type 2 diabetes mellitus with diabetic polyneuropathy: Secondary | ICD-10-CM

## 2024-06-09 DIAGNOSIS — Z9189 Other specified personal risk factors, not elsewhere classified: Secondary | ICD-10-CM | POA: Diagnosis not present

## 2024-06-09 NOTE — Assessment & Plan Note (Signed)
 Diabetic Foot Care   He is at risk for foot ulcers due to poor circulation, swelling, and maceration between toes, with pre-ulceration and a potential ulcer forming on one foot. Wide feet cause toes to squish together, increasing the risk of skin breakdown and infection. Emphasized the importance of wearing diabetic shoes and using toe fillers to prevent complications, including the risk of losing feet and severe outcomes like amputation from bacterial infection. Toe socks and toe spacers were recommended to prevent maceration and fungal growth. Order a diabetic shoe foot exam. Prescribe diabetic shoes with custom toe filler. Recommend using toe socks to prevent maceration. Advise on foot hygiene, including soap and Epsom salt soaks. Consider toe spacers to prevent toe squishing. Today I completed diabetes mellitus foot exam paperwork only.  General Health Maintenance   He is making progress towards weight management goals, currently weighing 235-236 pounds. Encourage continued efforts in maintaining mobility and stretching exercises to support overall health.

## 2024-06-09 NOTE — Assessment & Plan Note (Signed)
 Encouraged close foot care.

## 2024-06-09 NOTE — Progress Notes (Signed)
 ==============================  [facility]: [phone]   -- Medical Office Visit --  Patient: Parker [name]      Age: 58 y.o.       Sex:  male  Date:   06/09/2024 Today's Healthcare Provider: Bernardino KANDICE Cone, MD  ==============================   Chief Complaint: [place] Diabetic shoes and paper work (Pt is present for diabetic shoes paper work)  Discussed the use of AI scribe software for clinical note transcription with the patient, who gave verbal consent to proceed.  History of Present Illness 58 year old male with diabetes who presents for a diabetic foot evaluation.  He has fluctuating weight, currently at 235-236 pounds, and is close to his target weight of 230 pounds. He does not want to go below this weight.  He denies having had a foot ulcer in the past. He finds it challenging to clean between his toes, especially the smaller ones, and is trying to maintain his mobility by doing stretches during workouts and in the morning.  He experiences intermittent swelling in his feet. He is currently using diabetic socks and is considering using Epsom salt for foot care. He has not heard of toe socks before but is open to trying them to prevent his toes from being squished together.  He has [employer], which is covering the cost of his diabetic shoes, although he has some concern about the appearance of the shoes.  Needs to depart soon to pick up family, asks to limit appointment just to supporting diabetes mellitus footwear orders, which were completed today.  Physical Exam:    06/09/2024    3:50 PM 05/05/2024    9:20 AM 05/05/2024    9:19 AM  Vitals with BMI  Height 5' 11    Weight 236 lbs 10 oz    BMI 33.01    Systolic 138 161 843  Diastolic 88 118 111  Pulse 62 66 65  Vital signs reviewed.  Nursing notes reviewed. Weight trend reviewed. Physical Exam General Appearance:  No acute distress appreciable.    Well-groomed, healthy-appearing male.  Well proportioned with no abnormal fat distribution.  Good muscle tone. Pulmonary:  Normal work of breathing at rest, no respiratory distress apparent. SpO2: 98 %  Musculoskeletal: All extremities are intact.  Neurological:  Awake, alert, oriented, and engaged.  No obvious focal neurological deficits or cognitive impairments.  Sensorium seems unclouded.   Speech is clear and coherent with logical content. Psychiatric:  Appropriate mood, pleasant and cooperative demeanor, thoughtful and engaged during the exam \ Diabetic foot exam was performed with the following findings:   EXTREMITIES: Healthy skin with maceration between toes and pre-ulceration. Significant swelling and poor circulation noted. Dark skin from blood pooling. Ulcer formation beginning. Right foot exhibits better circulation than left, with more toe skin breakdown on the left foot. Appearance - no lesions, ulcers but there is preulcerative callusing between toes which have tight spacingand onychomycotic nail change Skin - no sigificant pallor or erythema, dark skin around ankle noted. Monofilament testing - sensitive bilaterally in following locations:  Right - Great toe, medial, central, lateral ball and posterior foot intact  Left - Great toe, medial, central, lateral ball and posterior foot intact Pulses - +1 distally bilaterally      Results:    06/09/2024    3:53 PM 02/02/2024    8:55 AM 08/11/2023    1:30 PM 07/09/2023    2:12 PM  PHQ 2/9 Scores  PHQ - 2 Score 0 2 2 3   PHQ- 9 Score 0 8 6 10         ASSESSMENT & PLAN   Assessment & Plan At risk for diabetic foot ulcer Diabetic Foot Care   He is at risk for foot ulcers due to poor circulation, swelling, and maceration between toes, with pre-ulceration and a potential ulcer forming on one foot. Wide feet cause toes to squish together, increasing the risk of skin breakdown and infection. Emphasized the importance of wearing diabetic  shoes and using toe fillers to prevent complications, including the risk of losing feet and severe outcomes like amputation from bacterial infection. Toe socks and toe spacers were recommended to prevent maceration and fungal growth. Order a diabetic shoe foot exam. Prescribe diabetic shoes with custom toe filler. Recommend using toe socks to prevent maceration. Advise on foot hygiene, including soap and Epsom salt soaks. Consider toe spacers to prevent toe squishing. Today I completed diabetes mellitus foot exam paperwork only.  General Health Maintenance   He is making progress towards weight management goals, currently weighing 235-236 pounds. Encourage continued efforts in maintaining mobility and stretching exercises to support overall health. Diabetic polyneuropathy associated with type 2 diabetes mellitus (HCC) Encouraged close foot care.       This document was synthesized by artificial intelligence ([employer]) using HIPAA-compliant recording of the clinical interaction;   We discussed the use of AI scribe software for clinical note transcription with the patient, who gave verbal consent to proceed. additional Info: This encounter employed state-of-the-art, real-time, collaborative documentation. The patient actively reviewed and assisted in updating their electronic medical record on a shared screen, ensuring transparency and facilitating joint problem-solving for the problem list, overview, and plan. This approach promotes accurate, informed care. The treatment plan was discussed and reviewed in detail, including medication safety, potential side effects, and all patient questions. We confirmed understanding and comfort with the plan. Follow-up instructions were established, including contacting the office for any concerns, returning if symptoms worsen, persist, or new symptoms develop, and precautions for potential emergency department visits.

## 2024-06-14 ENCOUNTER — Other Ambulatory Visit: Payer: Self-pay | Admitting: *Deleted

## 2024-06-14 DIAGNOSIS — M1A49X0 Other secondary chronic gout, multiple sites, without tophus (tophi): Secondary | ICD-10-CM | POA: Diagnosis not present

## 2024-06-15 LAB — COMPREHENSIVE METABOLIC PANEL WITH GFR
AG Ratio: 1.3 (calc) (ref 1.0–2.5)
ALT: 59 U/L — ABNORMAL HIGH (ref 9–46)
AST: 106 U/L — ABNORMAL HIGH (ref 10–35)
Albumin: 4.2 g/dL (ref 3.6–5.1)
Alkaline phosphatase (APISO): 77 U/L (ref 35–144)
BUN: 9 mg/dL (ref 7–25)
CO2: 26 mmol/L (ref 20–32)
Calcium: 9.1 mg/dL (ref 8.6–10.3)
Chloride: 101 mmol/L (ref 98–110)
Creat: 1 mg/dL (ref 0.70–1.30)
Globulin: 3.3 g/dL (ref 1.9–3.7)
Glucose, Bld: 100 mg/dL — ABNORMAL HIGH (ref 65–99)
Potassium: 4.3 mmol/L (ref 3.5–5.3)
Sodium: 139 mmol/L (ref 135–146)
Total Bilirubin: 1.9 mg/dL — ABNORMAL HIGH (ref 0.2–1.2)
Total Protein: 7.5 g/dL (ref 6.1–8.1)
eGFR: 87 mL/min/1.73m2 (ref >=60)

## 2024-06-15 LAB — SEDIMENTATION RATE: Sed Rate: 2 mm/h (ref 0–20)

## 2024-06-15 LAB — URIC ACID: Uric Acid, Serum: 6.8 mg/dL (ref 4.0–8.0)

## 2024-06-29 ENCOUNTER — Telehealth: Payer: Self-pay | Admitting: Internal Medicine

## 2024-06-29 NOTE — Telephone Encounter (Unsigned)
 Copied from CRM [id number]. Topic: General - Other >> Jun 28, 2024  4:19 PM [name] wrote: Reason for CRM: Patient needs his handicap placard renewed and questioning if he has to get seen in office for this. Callback number [phone].

## 2024-07-22 ENCOUNTER — Telehealth: Payer: Self-pay | Admitting: Internal Medicine

## 2024-07-22 NOTE — Telephone Encounter (Unsigned)
 Copied from CRM [id number]. Topic: General - Other >> Jun 28, 2024  4:19 PM [name] wrote: Reason for CRM: Patient needs his handicap placard renewed and questioning if he has to get seen in office for this. Callback number [phone]. >> Jul 22, 2024 10:45 AM [name] wrote: Patient is calling in regarding the handicap place card, went through the [place] message that was sent to the patient regarding the steps, the patient declined uploading the form to mychart and a virtual visit due to him being older and not being able to do this as he doesn't understand. Explained to patient that was okay if we wanted to come in to the office and have a visit to fill out the forms we could do that as well. The first appointment was  September 03, patient became very frustrated stating he needs this done today and that this is ridiculous he has to go through all of this at his age to get the handicap that he has had for years. Patient stated Dr. Jesus should just be able to fill it out and him not have to do a visit because he was just seen, and hung up. Please advise patient.

## 2024-07-22 NOTE — Telephone Encounter (Signed)
 Sent to pt.

## 2024-07-27 ENCOUNTER — Telehealth: Payer: Self-pay

## 2024-07-27 NOTE — Telephone Encounter (Signed)
 Copied from CRM [id number]. Topic: General - Other >> Jul 27, 2024 10:00 AM Jasmin G wrote: Reason for CRM: Pt would like a call back at [phone] to see if paperwork for his wheelchair sticker can be completed on the same day, he wants to stop by the clinic and get the paperwork filles out, please discuss if this is possible.   Spoke with pt about paper that needs to be signed stated only one paper needs to be done pt will bring by tomorrow to be signed by provider. Pt states was seen last mth.

## 2024-07-28 NOTE — Progress Notes (Signed)
 ADVANCED HF CLINIC NOTE  PCP: Dr. Jesus HF Cardiologist: New  HPI:  [name] is a 58 y.o.male with HFpEF, DM2, atrial fibrillation, obesity and hypothyroidism. Previously followed by [facility] cardiology.    Parker [name] was discharged from [facility] 2/24 with a fib RVR and a/c HFpEF. Diuresed well during admission however refused to stay longer and still had volume on him at d/c. Was referred to AHF TOC.    He was seen at initial TOC appt 02/14/23 and found to be markedly volume overloaded and in atrial fibrillation with RVR. He was diuresed with IV lasix  and metolazone , overall down 20 lbs. Underwent RHC showing mild pulmonary HTN with R>L heart failure with elevated filling pressures. He was placed on IV amio for afib and underwent TEE/DCCV to NSR. Drips weaned and GDMT titrated, he was discharged home weight 275 lbs.  Sleep study 4/24 showed moderate OSA, AHI 26/hr  Returns today for HF follow up. Says BP is up because he is having a lot of knee pain. (Typical SBP 130-140 at home). Otherwise feels ok. Breathing ok. No edema, orthopnea or PND. Now on Ozempic . Lost 65-70 pounds. Going to gym 3-4x/week. Has been off torsemide  for several weeks Watching fluid intake closely.   Cardiac Testing  - TEE (3/24): LVEF 55-60%, RV severely reduced, mild to mod MR, mod to severe TR, no LAA thrombus   - RHC (3/24): 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 with elevated filling pressures RA 15 PA 44/23 (33) PCW 19 CO/CI (Fick) 6.6/2.7 PVR 2.2 WU PAPi 1.4  - Echo (2/24): EF 55%, mild LVH, severe RV enlargement with moderate RV systolic dysfunction. - Echo at [facility] (8/23): EF 45-50%, mild LVH - LHC (7/22):mild plaque in mid LAD, normal ramus, normal left circumflex, normal RCA. There was slightly sluggish flow, suspicious for microvascular dysfunction.  - Echo (7/22): EF 55%, mild LVH, RV free wall was hypokinetic, mild MVR and TV - TEE (4/19): no thrombus, s/p DCCV AFL --> NSR - Echo (9/18):  EF  50-55%, normal RV systolic function, mildly dilated LA and RA.  Past Medical History:  Diagnosis Date   (HFpEF) heart failure with preserved ejection fraction (HCC)    Acute on chronic heart failure (HCC) 05/16/2022   AKI (acute kidney injury) (HCC) 01/23/2023   Lab Results  Component  Value  Date     CREATININE  0.97  03/03/2023     CREATININE  1.14  02/19/2023     CREATININE  1.29 (H)  02/18/2023         Arthritis    knees. elbow, ankle   Cataract    CHF (congestive heart failure) (HCC)    Diabetes mellitus without complication (HCC)    Diastolic CHF (HCC) 02/14/2023   Dyspnea    when in A-fib   Dysrhythmia    A-fib   GERD (gastroesophageal reflux disease)    takes Dexilant prn   History of COVID-19 05/18/2021   Formatting of this note might be different from the original. Not requiring hospitalization Formatting of this note might be different from the original. Not requiring hospitalization   Hypertension    takes Lisinopril -HCTZ daily   Hypothyroidism    Kidney injury 03/07/2023   Chronic kidney disease, stage 3a (ICD-10: N18.31): GFR 58.20 mL/min on 07/09/2023 indicates mild kidney dysfunction. Monitor closely and adjust medications as needed.            Lab Results      Component  Value    Date/Time           CREATININE    1.35    07/09/2023 02:47 PM           CREATININE    1.20    03/12/2023 12:10 PM           CREATININE    0.97    03/03/2023 02:39 PM           CREATI   Localized swelling of left lower extremity 01/11/2020   Localized swelling of right lower extremity 04/24/2020   Muscle spasm    takes Relafen  daily   OSA on CPAP 03/07/2023   does not use CPAP   Peripheral edema    Plantar fasciitis    right   Situational insomnia 01/06/2018   Sleep apnea    Sprain of deltoid ligament of left ankle 08/10/2019   Torn meniscus    right knee   Current Outpatient Medications  Medication Sig Dispense Refill   albuterol  (VENTOLIN  HFA) 108 (90 Base) MCG/ACT inhaler  Inhale 2 puffs into the lungs every 6 (six) hours as needed for shortness of breath.     allopurinol  (ZYLOPRIM ) 300 MG tablet Take 1 tablet (300 mg total) by mouth daily. 90 tablet 0   amiodarone  (PACERONE ) 200 MG tablet Take 100 mg by mouth daily.     amLODipine  (NORVASC ) 5 MG tablet Take 1 tablet (5 mg total) by mouth daily. 180 tablet 3   augmented betamethasone dipropionate (DIPROLENE-AF) 0.05 % cream Apply topically 2 (two) times daily as needed.     Clotrimazole  1 % OINT Apply 1 Application topically 2 (two) times a week. (Patient taking differently: Apply 1 Application topically 2 (two) times a week. As needed) 30 g 11   colchicine  0.6 MG tablet Take 1/2 tablet by mouth daily or increase to 1 tablet for up to 5 days for flare 45 tablet 0   Cyanocobalamin  (B-12) 1000 MCG SUBL Place 1 tablet under the tongue daily at 6 (six) AM. 90 tablet 3   cyclobenzaprine  (FLEXERIL ) 10 MG tablet Take 1-2 tablets (10-20 mg total) by mouth 3 (three) times daily as needed for muscle spasms. 30 tablet 0   empagliflozin  (JARDIANCE ) 10 MG TABS tablet Take 1 tablet (10 mg total) by mouth daily. 90 tablet 3   fluticasone  (FLONASE ) 50 MCG/ACT nasal spray Place 2 sprays into both nostrils daily. (Patient taking differently: Place 2 sprays into both nostrils daily as needed for allergies.) 16 g 0   folic acid  (FOLVITE ) 1 MG tablet Take 1 tablet (1 mg total) by mouth daily as needed (low iron). 90 tablet 3   HYDROcodone -acetaminophen  (NORCO/VICODIN) 5-325 MG tablet Take 1 tablet by mouth daily as needed. 10 tablet 0   HYDROmorphone  (DILAUDID ) 2 MG tablet Take by mouth.     ibuprofen  (ADVIL ) 800 MG tablet Take 1 tablet (800 mg total) by mouth every 8 (eight) hours as needed. 30 tablet 2   levothyroxine  (SYNTHROID ) 50 MCG tablet Take 1 tablet (50 mcg total) by mouth daily. Needs to be adjusted every 3-6 months based on labwork if still taking amiodarone  90 tablet 3   Magnesium  Bisglycinate (MAG GLYCINATE) 100 MG TABS Take  1 tablet by mouth daily. 90 tablet 3   meclizine (ANTIVERT) 25 MG tablet Take 25 mg by mouth 3 (three) times daily as needed for dizziness.     metoprolol  succinate (TOPROL -XL) 50 MG 24 hr tablet Take 1 tablet (50 mg total) by  mouth daily. 90 tablet 3   Multiple Vitamins-Minerals (MULTIVITAMIN WITH MINERALS) tablet Take 1 tablet by mouth daily.     naltrexone  (DEPADE) 50 MG tablet Take 0.5 tablets (25 mg total) by mouth daily. 90 tablet 1   potassium chloride  (KLOR-CON ) 10 MEQ tablet Take 2 tablets (20 mEq total) by mouth daily. 180 tablet 3   pregabalin (LYRICA) 50 MG capsule Take 50 mg by mouth.     rivaroxaban  (XARELTO ) 20 MG TABS tablet Take 1 tablet (20 mg total) by mouth daily with supper. 90 tablet 3   rosuvastatin  (CRESTOR ) 20 MG tablet Take 1 tablet (20 mg total) by mouth daily. Replaces 10 mg dose, can cause cramps 90 tablet 3   sacubitril -valsartan  (ENTRESTO ) 97-103 MG Take 1 tablet by mouth 2 (two) times daily. 60 tablet 11   spironolactone  (ALDACTONE ) 25 MG tablet Take 1 tablet (25 mg total) by mouth daily. 90 tablet 3   torsemide  (DEMADEX ) 20 MG tablet TAKE 1 TABLET BY MOUTH EVERY DAY (Patient taking differently: Take 20 mg by mouth as needed.) 90 tablet 1   traMADol  (ULTRAM ) 50 MG tablet Take 1-2 tablets (50-100 mg total) by mouth daily as needed. 20 tablet 0   traZODone  (DESYREL ) 50 MG tablet Take 25 mg by mouth at bedtime as needed.     No current facility-administered medications for this visit.   Allergies  Allergen Reactions   Iodinated Contrast Media Itching   Other Rash and Other (See Comments)    Other reaction(s): Other (See Comments)  Neoprene: Rash   Social History   Socioeconomic History   Marital status: Single    Spouse name: Not on file   Number of children: Not on file   Years of education: Not on file   Highest education level: Some college, no degree  Occupational History   Not on file  Tobacco Use   Smoking status: Never    Passive exposure: Past    Smokeless tobacco: Never  Vaping Use   Vaping status: Never Used  Substance and Sexual Activity   Alcohol use: Yes    Alcohol/week: 6.0 standard drinks of alcohol    Types: 6 Standard drinks or equivalent per week    Comment: drinks wine   Drug use: No   Sexual activity: Yes  Other Topics Concern   Not on file  Social History Narrative   Not on file   Social Drivers of Health   Financial Resource Strain: Low Risk  (02/02/2024)   Overall Financial Resource Strain (CARDIA)    Difficulty of Paying Living Expenses: Not very hard  Food Insecurity: Food Insecurity Present (02/02/2024)   Hunger Vital Sign    Worried About Running Out of Food in the Last Year: Sometimes true    Ran Out of Food in the Last Year: Sometimes true  Transportation Needs: No Transportation Needs (02/02/2024)   PRAPARE - Administrator, Civil Service (Medical): No    Lack of Transportation (Non-Medical): No  Physical Activity: Sufficiently Active (02/02/2024)   Exercise Vital Sign    Days of Exercise per Week: 4 days    Minutes of Exercise per Session: 50 min  Stress: Stress Concern Present (02/02/2024)   [employer] of Occupational Health - Occupational Stress Questionnaire    Feeling of Stress : To some extent  Social Connections: Socially Isolated (02/02/2024)   Social Connection and Isolation Panel    Frequency of Communication with Friends and Family: More than three times a week  Frequency of Social Gatherings with Friends and Family: Three times a week    Attends Religious Services: Never    Active Member of Clubs or Organizations: No    Attends Banker Meetings: Never    Marital Status: Never married  Intimate Partner Violence: Not At Risk (02/02/2024)   Humiliation, Afraid, Rape, and Kick questionnaire    Fear of Current or Ex-Partner: No    Emotionally Abused: No    Physically Abused: No    Sexually Abused: No    There were no vitals taken for this visit.  Wt  Readings from Last 3 Encounters:  06/09/24 107.3 kg (236 lb 9.6 oz)  05/05/24 108 kg (238 lb)  04/30/24 109 kg (240 lb 3.2 oz)   PHYSICAL EXAM: General:  Obese male No resp difficulty HEENT: normal Neck: supple. no JVD. Carotids 2+ bilat; no bruits. No lymphadenopathy or thryomegaly appreciated. Cor: PMI nondisplaced. Regular rate & rhythm. No rubs, gallops or murmurs. Lungs: clear Abdomen: obese soft, nontender, nondistended. No hepatosplenomegaly. No bruits or masses. Good bowel sounds. Extremities: no cyanosis, clubbing, rash, edema + knee brace Neuro: alert & orientedx3, cranial nerves grossly intact. moves all 4 extremities w/o difficulty. Affect pleasant  ASSESSMENT & PLAN:  1. Chronic Diastolic Heart Failure with prominent RV failue - Echo (2/24): EF 55%, mild LVH, severe RV enlargement with moderate RV systolic dysfunction. - Admitted from AHF clinic on 02/14/23 for volume overload, in setting of AF RVR.  - RHC 02/17/23: RA 15, PA 44/23 (33), CO 6.6, CI 2.8, PAPi 1.4. 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 with elevated filling pressures - NYHA II volume ok on torsemide  PRN - Continue Entresto  to 97/103 mg bid. - Increase spiro to 25 mg daily. - Continue Jardiance  10 mg daily. - Continue Toprol  XL 50 mg daily. - Labs today   2. Persistent Atrial fibrillation - on-going since 2019 - TEE (9/18) moderate size thrombus in the LAA, no DCCV.   - TEE (4/19) no LAA thrombus, DCCV --> NSR - Admit 2/24 with AF with RVR & 3/24. - s/p TEE/DCCV 3/24 - NSR today - Continue amio 100 daily (decreased due to QT prolongation) - Continue Toprol  XL 50 mg daily. - Continue Xarelto  20 mg daily.No bleeding - Amio labs today  3. HTN - BP elevated - Increase spiro  - Add amlodipine  5   4. 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  - Mild MR - TEE (3/24) showed mod/severe TR, mild to moderate MR   5. OSA - Says he's unable to tolerate CPAP  mask. - Follows with Dr. Shlomo - Has been losing weight with GLP1RA   6. DM2 - Per PCP - hgb A1C 6.1 (07/09/23) - Continue SGLT2i.& GLP1RA   7. 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 - Continue Xarelto  + statin.   8. Obesity - There is no height or weight on file to calculate BMI. - Has been losing weight with [id number]  Harlene CHRISTELLA Gainer, FNP  11:50 AM

## 2024-07-28 NOTE — Telephone Encounter (Signed)
 Pt has a sticker to be sign for wheelchair he stated he will drop off today spoke with pt yesterday

## 2024-07-29 ENCOUNTER — Telehealth (HOSPITAL_COMMUNITY): Payer: Self-pay

## 2024-07-29 NOTE — Telephone Encounter (Signed)
 Called to confirm/remind patient of their [facility] on 07[facility].   Appointment:   [] Confirmed  [x] Left mess   [] No answer/No voice mail  [] VM Full/unable to leave message  [] Phone not in service  And to bring in all medications and/or complete list.

## 2024-07-29 NOTE — Telephone Encounter (Signed)
 Patient dropped off document Handicap Placard, to be filled out by provider. Patient requested to send it back via Call Patient to pick up within 5-days. Document is located in providers tray at front office.Please advise at Mobile [phone] (mobile)

## 2024-07-29 NOTE — Telephone Encounter (Signed)
Placed on providers desk to be signed

## 2024-07-30 ENCOUNTER — Ambulatory Visit (HOSPITAL_COMMUNITY)
Admission: RE | Admit: 2024-07-30 | Discharge: 2024-07-30 | Disposition: A | Discharge status: Home / Self Care | Source: Ambulatory Visit | Attending: Family Medicine | Admitting: Family Medicine

## 2024-07-30 ENCOUNTER — Encounter (HOSPITAL_COMMUNITY): Payer: Self-pay

## 2024-07-30 ENCOUNTER — Telehealth: Payer: Self-pay

## 2024-07-30 ENCOUNTER — Other Ambulatory Visit: Payer: Self-pay

## 2024-07-30 VITALS — BP 150/90 | HR 86 | Ht 71.0 in | Wt 229.4 lb

## 2024-07-30 DIAGNOSIS — Z8673 Personal history of transient ischemic attack (TIA), and cerebral infarction without residual deficits: Secondary | ICD-10-CM | POA: Insufficient documentation

## 2024-07-30 DIAGNOSIS — I130 Hypertensive heart and chronic kidney disease with heart failure and stage 1 through stage 4 chronic kidney disease, or unspecified chronic kidney disease: Secondary | ICD-10-CM | POA: Diagnosis not present

## 2024-07-30 DIAGNOSIS — I10 Essential (primary) hypertension: Secondary | ICD-10-CM

## 2024-07-30 DIAGNOSIS — E039 Hypothyroidism, unspecified: Secondary | ICD-10-CM | POA: Diagnosis not present

## 2024-07-30 DIAGNOSIS — Z86718 Personal history of other venous thrombosis and embolism: Secondary | ICD-10-CM | POA: Diagnosis not present

## 2024-07-30 DIAGNOSIS — G8929 Other chronic pain: Secondary | ICD-10-CM | POA: Diagnosis not present

## 2024-07-30 DIAGNOSIS — I272 Pulmonary hypertension, unspecified: Secondary | ICD-10-CM | POA: Diagnosis not present

## 2024-07-30 DIAGNOSIS — G4733 Obstructive sleep apnea (adult) (pediatric): Secondary | ICD-10-CM | POA: Diagnosis not present

## 2024-07-30 DIAGNOSIS — E1122 Type 2 diabetes mellitus with diabetic chronic kidney disease: Secondary | ICD-10-CM | POA: Insufficient documentation

## 2024-07-30 DIAGNOSIS — I4819 Other persistent atrial fibrillation: Secondary | ICD-10-CM | POA: Diagnosis not present

## 2024-07-30 DIAGNOSIS — E1149 Type 2 diabetes mellitus with other diabetic neurological complication: Secondary | ICD-10-CM

## 2024-07-30 DIAGNOSIS — I38 Endocarditis, valve unspecified: Secondary | ICD-10-CM | POA: Diagnosis not present

## 2024-07-30 DIAGNOSIS — E1140 Type 2 diabetes mellitus with diabetic neuropathy, unspecified: Secondary | ICD-10-CM | POA: Insufficient documentation

## 2024-07-30 DIAGNOSIS — E669 Obesity, unspecified: Secondary | ICD-10-CM | POA: Insufficient documentation

## 2024-07-30 DIAGNOSIS — Z7901 Long term (current) use of anticoagulants: Secondary | ICD-10-CM | POA: Insufficient documentation

## 2024-07-30 DIAGNOSIS — I081 Rheumatic disorders of both mitral and tricuspid valves: Secondary | ICD-10-CM | POA: Insufficient documentation

## 2024-07-30 DIAGNOSIS — I639 Cerebral infarction, unspecified: Secondary | ICD-10-CM

## 2024-07-30 DIAGNOSIS — Z79899 Other long term (current) drug therapy: Secondary | ICD-10-CM | POA: Diagnosis not present

## 2024-07-30 DIAGNOSIS — N1831 Chronic kidney disease, stage 3a: Secondary | ICD-10-CM | POA: Diagnosis not present

## 2024-07-30 DIAGNOSIS — I480 Paroxysmal atrial fibrillation: Secondary | ICD-10-CM

## 2024-07-30 DIAGNOSIS — I5032 Chronic diastolic (congestive) heart failure: Secondary | ICD-10-CM | POA: Insufficient documentation

## 2024-07-30 DIAGNOSIS — M069 Rheumatoid arthritis, unspecified: Secondary | ICD-10-CM | POA: Diagnosis not present

## 2024-07-30 DIAGNOSIS — Z7984 Long term (current) use of oral hypoglycemic drugs: Secondary | ICD-10-CM | POA: Diagnosis not present

## 2024-07-30 DIAGNOSIS — E119 Type 2 diabetes mellitus without complications: Secondary | ICD-10-CM | POA: Diagnosis not present

## 2024-07-30 DIAGNOSIS — M549 Dorsalgia, unspecified: Secondary | ICD-10-CM | POA: Insufficient documentation

## 2024-07-30 DIAGNOSIS — Z6831 Body mass index (BMI) 31.0-31.9, adult: Secondary | ICD-10-CM | POA: Diagnosis not present

## 2024-07-30 LAB — URIC ACID: Uric Acid, Serum: 7.8 mg/dL (ref 3.7–8.6)

## 2024-07-30 NOTE — Telephone Encounter (Signed)
 Placed per provider and pt requesting.

## 2024-07-30 NOTE — Telephone Encounter (Signed)
 Called pt to let him know that his handicap placard is ready for pick up placed in folder in front office. Also pt went to see the cariology today and stated he needs to be referred to neurology due to neuropathy and can not sleep at night. Cardiology stated that he can not do the referral pt pcp only can.

## 2024-07-30 NOTE — Patient Instructions (Signed)
 Re-start your amlodipine  5 mg daily. Labs today - will call you if abnormal. Referral sent to Pulmonary for help with your sleep apnea and CPAP mask. PYP scan has been ordered. We will obtain prior authorization from your insurance company and call you to schedule. Return to see Dr. Cherrie in 4 months. CALL [phone] IN DECEMBER TO SCHEDULE THIS APPOINTMENT. Please call us  at [phone] if any questions or concerns prior to your next visit.    You have been ordered a PYP Scan.  This is done in the Radiology Department of [facility].  When you come for this test please plan to be there 2-3 hours.

## 2024-07-31 LAB — HEMOGLOBIN A1C
Hgb A1c MFr Bld: 5.8 % — ABNORMAL HIGH (ref 4.8–5.6)
Mean Plasma Glucose: 120 mg/dL

## 2024-08-03 LAB — KAPPA/LAMBDA LIGHT CHAINS
Kappa free light chain: 24.5 mg/L — ABNORMAL HIGH (ref 3.3–19.4)
Kappa, lambda light chain ratio: 1.2 (ref 0.26–1.65)
Lambda free light chains: 20.4 mg/L (ref 5.7–26.3)

## 2024-08-04 ENCOUNTER — Encounter

## 2024-08-04 ENCOUNTER — Ambulatory Visit (HOSPITAL_COMMUNITY): Payer: Self-pay | Admitting: Family Medicine

## 2024-08-04 LAB — IMMUNOFIXATION, URINE

## 2024-08-05 ENCOUNTER — Ambulatory Visit: Admitting: Internal Medicine

## 2024-08-05 ENCOUNTER — Telehealth (HOSPITAL_COMMUNITY): Payer: Self-pay

## 2024-08-05 NOTE — Telephone Encounter (Signed)
No precert required 

## 2024-08-05 NOTE — Telephone Encounter (Signed)
-----   Message from Olam ORN sent at 08/03/2024  2:47 PM EDT ----- Needs Prior shara for PYP please.

## 2024-08-09 ENCOUNTER — Encounter: Payer: Self-pay | Admitting: Neurology

## 2024-08-10 ENCOUNTER — Encounter (HOSPITAL_COMMUNITY): Payer: Self-pay

## 2024-08-17 ENCOUNTER — Other Ambulatory Visit (HOSPITAL_COMMUNITY): Payer: Self-pay | Admitting: Family Medicine

## 2024-08-17 ENCOUNTER — Telehealth: Payer: Self-pay

## 2024-08-17 DIAGNOSIS — I5032 Chronic diastolic (congestive) heart failure: Secondary | ICD-10-CM

## 2024-08-17 DIAGNOSIS — E118 Type 2 diabetes mellitus with unspecified complications: Secondary | ICD-10-CM

## 2024-08-17 NOTE — Addendum Note (Signed)
 Addended by: Lanayah Gartley G on: 08/17/2024 08:03 PM   Modules accepted: Orders

## 2024-08-17 NOTE — Telephone Encounter (Signed)
 Copied from CRM [id number]. Topic: General - Other >> Aug 17, 2024 10:57 AM Berneda FALCON wrote: Reason for CRM: Patient states he had a foot eval a couple months ago-[facility] no longer [facility] it, so he called [facility] and they need a referral and documentation as to what he needs so he can be seen.  [facility] Address: [address] [place], [place] [address] Phone: [phone]  Pt callback is [phone] (home)  Please review and advise next step

## 2024-08-18 DIAGNOSIS — I10 Essential (primary) hypertension: Secondary | ICD-10-CM | POA: Diagnosis not present

## 2024-08-18 DIAGNOSIS — R600 Localized edema: Secondary | ICD-10-CM | POA: Diagnosis not present

## 2024-08-18 DIAGNOSIS — I83893 Varicose veins of bilateral lower extremities with other complications: Secondary | ICD-10-CM | POA: Diagnosis not present

## 2024-08-18 DIAGNOSIS — I87393 Chronic venous hypertension (idiopathic) with other complications of bilateral lower extremity: Secondary | ICD-10-CM | POA: Diagnosis not present

## 2024-08-24 ENCOUNTER — Ambulatory Visit (HOSPITAL_COMMUNITY)
Admission: RE | Admit: 2024-08-24 | Discharge: 2024-08-24 | Disposition: A | Discharge status: Home / Self Care | Source: Ambulatory Visit | Attending: Internal Medicine | Admitting: Internal Medicine

## 2024-08-24 DIAGNOSIS — I5032 Chronic diastolic (congestive) heart failure: Secondary | ICD-10-CM | POA: Diagnosis not present

## 2024-08-24 DIAGNOSIS — I517 Cardiomegaly: Secondary | ICD-10-CM | POA: Diagnosis not present

## 2024-08-24 LAB — MYOCARDIAL AMYLOID PLANAR & SPECT: H/CL Ratio: 1.12

## 2024-08-24 MED ORDER — TECHNETIUM TC 99M PYROPHOSPHATE
21.7000 | Freq: Once | INTRAVENOUS | Status: AC
  Administered 2024-08-24: 21.7 via INTRAVENOUS

## 2024-08-26 ENCOUNTER — Ambulatory Visit: Payer: Self-pay

## 2024-08-26 NOTE — Telephone Encounter (Signed)
 FYI Only or Action Required?: FYI only for provider.  Patient was last seen in primary care on 06/09/2024 by Jesus Bernardino MATSU, MD.  Called Nurse Triage reporting Numbness.  Symptoms began several months ago.  Interventions attempted: OTC medications: nervive cream.  Symptoms are: gradually worsening.  Triage Disposition: See Physician Within 24 Hours  Patient/caregiver understands and will follow disposition?: Yes       Copied from CRM #[id number]. Topic: Clinical - Red Word Triage >> Aug 26, 2024 10:12 AM Dedra B wrote: [facility] that prompted transfer to Nurse Triage: Pt has been experiencing numbness, tingling, and sharp pain in his fingers and toes. Warm transfer to NT. Reason for Disposition  Numbness (i.e., loss of sensation) in hand or fingers  (Exceptions: Just tingling; numbness present > 2 weeks.)  Answer Assessment - Initial Assessment Questions 1. ONSET: When did the pain start?     Endorses numbness/tingling, pain started 6 mos ago 2. LOCATION: Where is the pain located?     Fingers/toes 3. PAIN: How bad is the pain? (Scale 1-10; or mild, moderate, severe)     Sharp pain, gets numb/tingling 4. WORK OR EXERCISE: Has there been any recent work or exercise that involved this part (i.e., hand or wrist) of the body?     denies 5. CAUSE: What do you think is causing the pain?     Peripheral neuropathy 6. AGGRAVATING FACTORS: What makes the pain worse? (e.g., using computer)     denies 7. OTHER SYMPTOMS: Do you have any other symptoms? (e.g., fever, neck pain, numbness or tingling, rash, swelling)     denies 8. PREGNANCY: Is there any chance you are pregnant? When was your last menstrual period?     N/a  Protocols used: Hand Pain-A-AH

## 2024-08-26 NOTE — Telephone Encounter (Signed)
Noted. Patient scheduled for tomorrow.

## 2024-08-27 ENCOUNTER — Ambulatory Visit (INDEPENDENT_AMBULATORY_CARE_PROVIDER_SITE_OTHER): Admitting: Internal Medicine

## 2024-08-27 ENCOUNTER — Encounter: Payer: Self-pay | Admitting: Internal Medicine

## 2024-08-27 VITALS — BP 156/106 | HR 74 | Temp 98.0°F | Ht 71.0 in | Wt 227.0 lb

## 2024-08-27 DIAGNOSIS — I482 Chronic atrial fibrillation, unspecified: Secondary | ICD-10-CM

## 2024-08-27 DIAGNOSIS — G4733 Obstructive sleep apnea (adult) (pediatric): Secondary | ICD-10-CM

## 2024-08-27 DIAGNOSIS — Z23 Encounter for immunization: Secondary | ICD-10-CM

## 2024-08-27 DIAGNOSIS — Z8673 Personal history of transient ischemic attack (TIA), and cerebral infarction without residual deficits: Secondary | ICD-10-CM | POA: Diagnosis not present

## 2024-08-27 DIAGNOSIS — F1090 Alcohol use, unspecified, uncomplicated: Secondary | ICD-10-CM | POA: Diagnosis not present

## 2024-08-27 DIAGNOSIS — I1A0 Resistant hypertension: Secondary | ICD-10-CM

## 2024-08-27 DIAGNOSIS — G629 Polyneuropathy, unspecified: Secondary | ICD-10-CM | POA: Diagnosis not present

## 2024-08-27 MED ORDER — NALTREXONE HCL 50 MG PO TABS: 25.0000 mg | ORAL_TABLET | Freq: Every day | ORAL | 1 refills | Status: AC

## 2024-08-27 MED ORDER — AMLODIPINE BESYLATE 10 MG PO TABS: 10.0000 mg | ORAL_TABLET | Freq: Every day | ORAL | 4 refills | Status: AC

## 2024-08-27 NOTE — Progress Notes (Signed)
 .==============================  [facility]: [phone]   -- Medical Office Visit --  Patient: Parker [name]      Age: 58 y.o.       Sex:  male  Date:   08/27/2024 Today's Healthcare Provider: Bernardino KANDICE Cone, MD  ==============================   Chief Complaint: Leg Pain (Pt is having some numbness and tingling in both legs mainly toes as well has been happening lately ) and Hand Pain (Having some numbness and tingling in both hands and arms lower. Has been going on for months but getting worse at times)  Discussed the use of AI scribe software for clinical note transcription with the patient, who gave verbal consent to proceed.  History of Present Illness 58 year old male with hypertension and a history of stroke who presents for blood pressure management.  He has a history of hypertension that has been difficult to control despite being on multiple medications. His blood pressure readings at home fluctuate, with recent readings around 132/86 mmHg. He is currently on five different blood pressure medications, including Entresto , Aldactone , and metoprolol . Despite this regimen, his blood pressure remains inadequately controlled, with the best readings in the high 130s.  He has a history of stroke, which he describes as a significant event that he does not wish to experience again. He recalls missing doses of his blood thinner, Xarelto , prior to the stroke. Since the stroke, he experiences persistent vertigo, which he describes as 'awful' and affecting his daily life. He is not currently seeing a neurologist for this issue.  He also has a history of atrial fibrillation and is on Xarelto  for anticoagulation. He mentions that he missed doses of Xarelto  around the time of his stroke.  He experiences neuropathy, primarily affecting his legs, feet, and toes, causing significant discomfort and impacting his sleep. He is currently taking gabapentin  for this  condition but reports it provides minimal relief. He has not seen a pain specialist for this issue.  He has a history of alcohol use, which he acknowledges has been problematic. He is taking naltrexone  to help reduce his desire for alcohol.  He has a history of gout but reports that he has not had a flare in months.  He mentions a past diagnosis of mild kidney disease, which was noted to have resolved in January.  He has a history of sleep apnea and has not been using a CPAP machine due to discomfort. He has not been using a CPAP machine due to discomfort.  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, and etoh use.; Hypothyroidism; Gout; Obesity, Class III, BMI 40-49.9 (morbid obesity); OSA (obstructive sleep apnea); IDA (iron deficiency anemia); Vertigo; High risk for readmission; Hyperlipidemia; Erectile dysfunction; History of stroke; hypokalemia, drug induced; Alcohol use; Cervical spondylosis; Chronic neck pain; Depression; Diverticulosis of large intestine without hemorrhage; Edema of both lower extremities; Erectile dysfunction due to arterial insufficiency; Idiopathic chronic gout 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; Large liver; H/O medication noncompliance; LVH (left ventricular hypertrophy); Lymphedema; Mitral valve regurgitation; Prediabetes; Severe obesity (BMI 35.0-39.9) with comorbidity (HCC); Transaminitis; Tubular adenoma of colon; Arthralgia of left ankle; Acute pain of right knee; Hypomagnesemia; Positive cardiac stress test; Primary localized osteoarthritis of left knee; Tricuspid  valve regurgitation; Alpha thalassaemia minor; Swelling of  hand; Pain in right hand; Pain involving joints of fingers of both hands; NAFLD (nonalcoholic fatty liver disease); B12 deficiency; Folate deficiency; Chronic kidney disease, stage 2 (mild); Positive colorectal cancer screening using Cologuard test; Diabetic polyneuropathy associated with type 2 diabetes mellitus (HCC); Vitamin D  deficiency; Elevated AST (SGOT); Abnormal CBC; Closed fracture of lateral portion of right tibial plateau; Acute medial meniscus tear of right knee; Tear of lateral meniscus of right knee, current; and At risk for diabetic foot ulcer on their problem list. Past Medical History:  has a past medical history of (HFpEF) heart failure with preserved ejection fraction (HCC), Acute on chronic heart failure (HCC) (05/16/2022), AKI (acute kidney injury) (01/23/2023), Arthritis, Cataract, CHF (congestive heart failure) (HCC), Diabetes mellitus without complication (HCC), Diastolic CHF (HCC) (02/14/2023), Dyspnea, Dysrhythmia, GERD (gastroesophageal reflux disease), History of COVID-19 (05/18/2021), Hypertension, Hypothyroidism, Kidney injury (03/07/2023), Localized swelling of left lower extremity (01/11/2020), Localized swelling of right lower extremity (04/24/2020), Muscle spasm, OSA on CPAP (03/07/2023), Peripheral edema, Plantar fasciitis, Situational insomnia (01/06/2018), Sleep apnea, Sprain of deltoid ligament of left ankle (08/10/2019), and Torn meniscus. Past Surgical History:   has a past surgical history that includes left knee arthroscopy (2003); Esophagogastroduodenoscopy; Parotidectomy (Right, 08/06/2013); Tonsillectomy (Bilateral, 08/06/2013); Ear Cyst Excision (N/A, 08/06/2013); RIGHT HEART CATH (N/A, 02/17/2023); TEE without cardioversion (N/A, 02/19/2023); Cardioversion (N/A, 02/19/2023); and Synovectomy (Left, 01/26/2024). Social History:   reports that he has never smoked. He has been exposed to tobacco smoke. He has never used smokeless tobacco. He reports current alcohol use of  about 6.0 standard drinks of alcohol per week. He reports that he does not use drugs. Family History:  family history is not on file. Allergies:  is allergic to iodinated contrast media and other.   Medication Reconciliation: Current Outpatient Medications on File Prior to Visit  Medication Sig   albuterol  (VENTOLIN  HFA) 108 (90 Base) MCG/ACT inhaler Inhale 2 puffs into the lungs every 6 (six) hours as needed for shortness of breath.   allopurinol  (ZYLOPRIM ) 300 MG tablet Take 1 tablet (300 mg total) by mouth daily.   amiodarone  (PACERONE ) 200 MG tablet Take 100 mg by mouth daily.   augmented betamethasone dipropionate (DIPROLENE-AF) 0.05 % cream Apply topically 2 (two) times daily as needed.   Clotrimazole  1 % OINT Apply 1 Application topically 2 (two) times a week. (Patient taking differently: Apply 1 Application topically 2 (two) times a week. As needed)   colchicine  0.6 MG tablet Take 1/2 tablet by mouth daily or increase to 1 tablet for up to 5 days for flare   Cyanocobalamin  (B-12) 1000 MCG SUBL Place 1 tablet under the tongue daily at 6 (six) AM.   cyclobenzaprine  (FLEXERIL ) 10 MG tablet Take 1-2 tablets (10-20 mg total) by mouth 3 (three) times daily as needed for muscle spasms.   empagliflozin  (JARDIANCE ) 10 MG TABS tablet Take 1 tablet (10 mg total) by mouth daily.   fluticasone  (FLONASE ) 50 MCG/ACT nasal spray Place 2 sprays into both nostrils daily. (Patient taking differently: Place 2 sprays into both nostrils daily as needed for allergies.)   folic acid  (FOLVITE ) 1 MG tablet Take 1 tablet (1 mg total) by mouth daily as needed (low iron).   HYDROcodone -acetaminophen  (NORCO/VICODIN) 5-325 MG tablet Take 1 tablet by mouth daily as needed.   HYDROmorphone  (DILAUDID ) 2 MG tablet Take by mouth.   ibuprofen  (ADVIL ) 800 MG tablet Take 1 tablet (800 mg total) by mouth every 8 (eight) hours as  needed.   levothyroxine  (SYNTHROID ) 50 MCG tablet Take 1 tablet (50 mcg total) by mouth daily.  Needs to be adjusted every 3-6 months based on labwork if still taking amiodarone    Magnesium  Bisglycinate (MAG GLYCINATE) 100 MG TABS Take 1 tablet by mouth daily.   meclizine (ANTIVERT) 25 MG tablet Take 25 mg by mouth 3 (three) times daily as needed for dizziness.   metoprolol  succinate (TOPROL -XL) 50 MG 24 hr tablet Take 1 tablet (50 mg total) by mouth daily.   Multiple Vitamins-Minerals (MULTIVITAMIN WITH MINERALS) tablet Take 1 tablet by mouth daily.   potassium chloride  (KLOR-CON ) 10 MEQ tablet Take 2 tablets (20 mEq total) by mouth daily.   pregabalin (LYRICA) 50 MG capsule Take 50 mg by mouth.   rivaroxaban  (XARELTO ) 20 MG TABS tablet Take 1 tablet (20 mg total) by mouth daily with supper.   rosuvastatin  (CRESTOR ) 20 MG tablet Take 1 tablet (20 mg total) by mouth daily. Replaces 10 mg dose, can cause cramps   sacubitril -valsartan  (ENTRESTO ) 97-103 MG Take 1 tablet by mouth 2 (two) times daily.   spironolactone  (ALDACTONE ) 25 MG tablet Take 1 tablet (25 mg total) by mouth daily.   torsemide  (DEMADEX ) 20 MG tablet TAKE 1 TABLET BY MOUTH EVERY DAY (Patient taking differently: Take 20 mg by mouth as needed.)   traMADol  (ULTRAM ) 50 MG tablet Take 1-2 tablets (50-100 mg total) by mouth daily as needed.   traZODone  (DESYREL ) 50 MG tablet Take 25 mg by mouth at bedtime as needed.   No current facility-administered medications on file prior to visit.   Medications Discontinued During This Encounter  Medication Reason   amLODipine  (NORVASC ) 5 MG tablet    naltrexone  (DEPADE) 50 MG tablet Reorder     Physical Exam:    08/27/2024    9:21 AM 08/27/2024    9:04 AM 07/30/2024   10:18 AM  Vitals with BMI  Height  5' 11 5' 11  Weight  227 lbs 229 lbs 6 oz  BMI  31.67 32.01  Systolic 156 160 849  Diastolic 106 110 90  Pulse  74 86  Vital signs reviewed.  Nursing notes reviewed. Weight trend reviewed.  BP Readings from Last 30 Encounters:  08/27/24 (!) 156/106  07/30/24 (!) 150/90   06/09/24 138/88  05/05/24 (!) 161/118  04/30/24 (!) 160/102  04/20/24 (!) 148/102  04/16/24 (!) 164/102  04/05/24 132/84  03/01/24 138/88  02/27/24 (!) 136/91  02/25/24 (!) 142/100  02/11/24 (!) 178/118  01/26/24 110/74  01/20/24 (!) 166/110  12/15/23 (!) 160/98  11/16/23 (!) 171/107  10/24/23 123/81  08/11/23 128/74  07/09/23 (!) 151/102  06/13/23 (!) 160/107  05/29/23 (!) 170/110  04/07/23 136/78  03/07/23 (!) 140/92  03/03/23 138/82  02/14/23 (!) 146/82  01/25/23 (!) 142/80  01/23/23 134/88  10/02/22 (!) 133/99  06/06/17 140/70  08/07/13 (!) 144/86  continues on, as of 08/27/2024 Current hypertension medications:       Sig   amLODipine  (NORVASC ) 10 MG tablet (Taking) Take 1 tablet (10 mg total) by mouth daily.   metoprolol  succinate (TOPROL -XL) 50 MG 24 hr tablet Take 1 tablet (50 mg total) by mouth daily.   sacubitril -valsartan  (ENTRESTO ) 97-103 MG Take 1 tablet by mouth 2 (two) times daily.   spironolactone  (ALDACTONE ) 25 MG tablet Take 1 tablet (25 mg total) by mouth daily.   torsemide  (DEMADEX ) 20 MG tablet TAKE 1 TABLET BY MOUTH EVERY DAY    Patient taking differently: Take 20 mg by mouth  as needed.       Lab Results  Component Value Date   BUN 9 06/14/2024   BUN 6 04/30/2024   BUN 5 (L) 02/11/2024   BUN <5 (L) 01/20/2024   BUN 5 (L) 12/15/2023   BUN 8 08/11/2023   BUN 11 07/09/2023   BUN 13 03/12/2023   BUN 11 03/03/2023   BUN 32 (H) 02/19/2023   BUN 32 (H) 02/18/2023   BUN 24 (H) 02/17/2023   BUN 17 02/16/2023   BUN 9 02/15/2023   BUN 7 02/14/2023   BUN 23 (H) 01/25/2023   BUN 21 (H) 01/24/2023   BUN 20 01/23/2023   BUN 9 10/02/2022   BUN 13 03/11/2022   BUN 14 08/06/2013   BUN 14 07/29/2013   CREATININE 1.00 06/14/2024   CREATININE 0.84 04/30/2024   CREATININE 0.81 02/11/2024   CREATININE 1.11 01/20/2024   CREATININE 0.83 12/15/2023   CREATININE 1.02 08/11/2023   CREATININE 1.35 07/09/2023   CREATININE 1.20 03/12/2023   CREATININE  0.97 03/03/2023   CREATININE 1.14 02/19/2023   CREATININE 1.29 (H) 02/18/2023   CREATININE 1.36 (H) 02/17/2023   CREATININE 1.14 02/16/2023   CREATININE 0.92 02/15/2023   CREATININE 0.83 02/14/2023   CREATININE 1.23 01/25/2023   CREATININE 1.32 (H) 01/24/2023   CREATININE 1.39 (H) 01/23/2023   CREATININE 1.41 (H) 01/23/2023   CREATININE 0.96 10/02/2022   CREATININE 0.95 03/11/2022   CREATININE 1.09 08/06/2013   CREATININE 1.00 07/29/2013   GFR 96.72 12/15/2023   GFR 81.41 08/11/2023   GFR 58.20 (L) 07/09/2023   EGFR 87 06/14/2024   EGFR 93 10/02/2022   HGBA1C 5.8 (H) 07/30/2024   HGBA1C 6.1 07/09/2023   HGBA1C 6.1 (H) 01/25/2023   LABURIC 7.8 07/30/2024   LABURIC 6.8 06/14/2024   LABURIC 9.1 (H) 12/15/2023   LABURIC 9.8 (H) 08/11/2023   LABURIC 10.9 (H) 07/09/2023   VD25OH 8.45 (L) 12/15/2023      Physical Activity: Sufficiently Active (02/02/2024)   Exercise Vital Sign    Days of Exercise per Week: 4 days    Minutes of Exercise per Session: 50 min   General Appearance:  No acute distress appreciable.   Well-groomed, healthy-appearing male.  Well proportioned with no abnormal fat distribution.  Good muscle tone. Pulmonary:  Normal work of breathing at rest, no respiratory distress apparent. SpO2: 98 %  Musculoskeletal: All extremities are intact.  Neurological:  Awake, alert, oriented, and engaged.  No obvious focal neurological deficits or cognitive impairments.  Sensorium seems unclouded.   Speech is clear and coherent with logical content. Psychiatric:  Appropriate mood, pleasant and cooperative demeanor, thoughtful and engaged during the exam   Verbalized to patient: Physical Exam MEASUREMENTS: Weight- 225.   Results:   Verbalized to patient: Results LABS Glomerular Filtration Rate (GFR): normal (12/2023)     06/09/2024    3:53 PM 02/02/2024    8:55 AM 08/11/2023    1:30 PM 07/09/2023    2:12 PM  PHQ 2/9 Scores  PHQ - 2 Score 0 2 2 3   PHQ- 9 Score 0 8 6 10      Hospital Outpatient Visit on 08/24/2024  Component Date Value Ref Range Status   H/CL Ratio 08/24/2024 1.12   Final  Hospital Outpatient Visit on 07/30/2024  Component Date Value Ref Range Status   Uric Acid, Serum 07/30/2024 7.8  3.7 - 8.6 mg/dL Final   Hgb J8r MFr Bld 07/30/2024 5.8 (H)  4.8 - 5.6 % Final  Mean Plasma Glucose 07/30/2024 120  mg/dL Final   Kappa free light chain 07/30/2024 24.5 (H)  3.3 - 19.4 mg/L Final   Lambda free light chains 07/30/2024 20.4  5.7 - 26.3 mg/L Final   Kappa, lambda light chain ratio 07/30/2024 1.20  0.26 - 1.65 Final   Immunofixation, Urine 07/30/2024 Comment   Final  Orders Only on 06/14/2024  Component Date Value Ref Range Status   Glucose, Bld 06/14/2024 100 (H)  65 - 99 mg/dL Final   BUN 92/85/7974 9  7 - 25 mg/dL Final   Creat 92/85/7974 1.00  0.70 - 1.30 mg/dL Final   eGFR 92/85/7974 87  > OR = 60 mL/min/1.47m2 Final   BUN/Creatinine Ratio 06/14/2024 SEE NOTE:  6 - 22 (calc) Final   Sodium 06/14/2024 139  135 - 146 mmol/L Final   Potassium 06/14/2024 4.3  3.5 - 5.3 mmol/L Final   Chloride 06/14/2024 101  98 - 110 mmol/L Final   CO2 06/14/2024 26  20 - 32 mmol/L Final   Calcium  06/14/2024 9.1  8.6 - 10.3 mg/dL Final   Total Protein 92/85/7974 7.5  6.1 - 8.1 g/dL Final   Albumin  06/14/2024 4.2  3.6 - 5.1 g/dL Final   Globulin 92/85/7974 3.3  1.9 - 3.7 g/dL (calc) Final   AG Ratio 06/14/2024 1.3  1.0 - 2.5 (calc) Final   Total Bilirubin 06/14/2024 1.9 (H)  0.2 - 1.2 mg/dL Final   Alkaline phosphatase (APISO) 06/14/2024 77  35 - 144 U/L Final   AST 06/14/2024 106 (H)  10 - 35 U/L Final   ALT 06/14/2024 59 (H)  9 - 46 U/L Final   Sed Rate 06/14/2024 2  0 - 20 mm/h Final   Uric Acid, Serum 06/14/2024 6.8  4.0 - 8.0 mg/dL Final  Hospital Outpatient Visit on 04/30/2024  Component Date Value Ref Range Status   Sodium 04/30/2024 140  135 - 145 mmol/L Final   Potassium 04/30/2024 3.4 (L)  3.5 - 5.1 mmol/L Final   Chloride 04/30/2024 101   98 - 111 mmol/L Final   CO2 04/30/2024 21 (L)  22 - 32 mmol/L Final   Glucose, Bld 04/30/2024 94  70 - 99 mg/dL Final   BUN 94/69/7974 6  6 - 20 mg/dL Final   Creatinine, Ser 04/30/2024 0.84  0.61 - 1.24 mg/dL Final   Calcium  04/30/2024 8.9  8.9 - 10.3 mg/dL Final   GFR, Estimated 04/30/2024 >60  >60 mL/min Final   Anion gap 04/30/2024 18 (H)  5 - 15 Final   B Natriuretic Peptide 04/30/2024 77.7  0.0 - 100.0 pg/mL Final   WBC 04/30/2024 6.8  4.0 - 10.5 K/uL Final   RBC 04/30/2024 5.12  4.22 - 5.81 MIL/uL Final   Hemoglobin 04/30/2024 13.8  13.0 - 17.0 g/dL Final   HCT 94/69/7974 41.3  39.0 - 52.0 % Final   MCV 04/30/2024 80.7  80.0 - 100.0 fL Final   MCH 04/30/2024 27.0  26.0 - 34.0 pg Final   MCHC 04/30/2024 33.4  30.0 - 36.0 g/dL Final   RDW 94/69/7974 14.9  11.5 - 15.5 % Final   Platelets 04/30/2024 528 (H)  150 - 400 K/uL Final   nRBC 04/30/2024 0.0  0.0 - 0.2 % Final   TSH 04/30/2024 8.089 (H)  0.350 - 4.500 uIU/mL Final   Free T4 04/30/2024 0.69  0.61 - 1.12 ng/dL Final   T3, Free 94/69/7974 3.3  2.0 - 4.4 pg/mL Final  Hospital Outpatient  Visit on 02/11/2024  Component Date Value Ref Range Status   Sodium 02/11/2024 139  135 - 145 mmol/L Final   Potassium 02/11/2024 3.9  3.5 - 5.1 mmol/L Final   Chloride 02/11/2024 105  98 - 111 mmol/L Final   CO2 02/11/2024 23  22 - 32 mmol/L Final   Glucose, Bld 02/11/2024 107 (H)  70 - 99 mg/dL Final   BUN 96/87/7974 5 (L)  6 - 20 mg/dL Final   Creatinine, Ser 02/11/2024 0.81  0.61 - 1.24 mg/dL Final   Calcium  02/11/2024 9.1  8.9 - 10.3 mg/dL Final   GFR, Estimated 02/11/2024 >60  >60 mL/min Final   Anion gap 02/11/2024 11  5 - 15 Final  Hospital Outpatient Visit on 02/11/2024  Component Date Value Ref Range Status   S' Lateral 02/11/2024 2.90  cm Final   Area-P 1/2 02/11/2024 2.87  cm2 Final   Est EF 02/11/2024 65 - 70%   Final  Admission on 01/26/2024, Discharged on 01/26/2024  Component Date Value Ref Range Status    Glucose-Capillary 01/26/2024 105 (H)  70 - 99 mg/dL Final   SURGICAL PATHOLOGY 01/26/2024    Final-Edited                   Value:SURGICAL PATHOLOGY CASE: [id number] PATIENT: Rivan Morini Surgical Pathology Report     Clinical History: tenosynovitis left wrist (cm)     FINAL MICROSCOPIC DIAGNOSIS:  A. TENOSYNOVIUM, LEFT WRIST:      Tenosynovial tissue with chronic inflammation and degenerative changes consistent with tenosynovitis.   GROSS DESCRIPTION:  Received in formalin is a 5.5 x 3.0 x 1.0 cm fragment of pink-tan soft tissue with a moderate amount of attached well lobulated yellow adipose. Sectioning reveals underlying pink-tan to fatty cut surfaces, without distinct lesions.  Representative sections are submitted in 1 block.  MARYSUE, 01/26/2024)  Final Diagnosis performed by Pepper Dutton, MD.   Electronically signed 01/27/2024 Technical and / or Professional components performed at [facility]. [facility], [address] [place], [place] [address].  Immunohistochemistry Technical component (if applicable) was performed at [facility]. [address], [address], [place], [place] [address].   IMMUNOHISTOCHEMISTRY DISCLAIMER (if applicable): Some of these immunohistochemical stains may have been developed and the performance characteristics determine by [facility]. Some may not have been cleared or approved by the U.S. Food and Drug Administration. The FDA has determined that such clearance or approval is not necessary. This test is used for clinical purposes. It should not be regarded as investigational or for research. This laboratory is certified under the Clinical Laboratory Improvement Amendments of 1988 (CLIA-88) as qualified to perform high complexity clinical laboratory testing.  The controls stained appropriately.   IHC stains are performed on formalin fixed, paraffin embedded tissue using a  3,3diaminobenzidine (DAB) chromogen and Leica Bond Autostainer System. The staining intensity of the nucleus is score manually and is reported as the percentage of tumor cell nuclei demonstrating specific nuclear                          staining. The specimens are fixed in 10% Neutral Formalin for at least 6 hours and up to 72hrs. These tests are validated on decalcified tissue. Results should be interpreted with caution given the possibility of false  negative results on decalcified specimens. Antibody Clones are as follows ER-clone [id number], PR-clone 16, Ki67- clone MM1. Some of these immunohistochemical stains may have been developed and the performance characteristics determined by [facility].    Glucose-Capillary 01/26/2024 91  70 - 99 mg/dL Final  Hospital Outpatient Visit on 01/20/2024  Component Date Value Ref Range Status   Sodium 01/20/2024 139  135 - 145 mmol/L Final   Potassium 01/20/2024 3.6  3.5 - 5.1 mmol/L Final   Chloride 01/20/2024 99  98 - 111 mmol/L Final   CO2 01/20/2024 27  22 - 32 mmol/L Final   Glucose, Bld 01/20/2024 106 (H)  70 - 99 mg/dL Final   BUN 97/81/7974 <5 (L)  6 - 20 mg/dL Final   Creatinine, Ser 01/20/2024 1.11  0.61 - 1.24 mg/dL Final   Calcium  01/20/2024 8.5 (L)  8.9 - 10.3 mg/dL Final   GFR, Estimated 01/20/2024 >60  >60 mL/min Final   Anion gap 01/20/2024 13  5 - 15 Final   B Natriuretic Peptide 01/20/2024 127.9 (H)  0.0 - 100.0 pg/mL Final   WBC 01/20/2024 6.4  4.0 - 10.5 K/uL Final   RBC 01/20/2024 5.90 (H)  4.22 - 5.81 MIL/uL Final   Hemoglobin 01/20/2024 15.7  13.0 - 17.0 g/dL Final   HCT 97/81/7974 46.9  39.0 - 52.0 % Final   MCV 01/20/2024 79.5 (L)  80.0 - 100.0 fL Final   MCH 01/20/2024 26.6  26.0 - 34.0 pg Final   MCHC 01/20/2024 33.5  30.0 - 36.0 g/dL Final   RDW 97/81/7974 14.8  11.5 - 15.5 % Final   Platelets 01/20/2024 233  150 - 400 K/uL Final   nRBC 01/20/2024 0.6 (H)  0.0 - 0.2 % Final   TSH 01/20/2024 9.186 (H)  0.350 -  4.500 uIU/mL Final  Office Visit on 12/15/2023  Component Date Value Ref Range Status   Sodium 12/15/2023 141  135 - 145 mEq/L Final   Potassium 12/15/2023 3.7  3.5 - 5.1 mEq/L Final   Chloride 12/15/2023 102  96 - 112 mEq/L Final   CO2 12/15/2023 23  19 - 32 mEq/L Final   Glucose, Bld 12/15/2023 85  70 - 99 mg/dL Final   BUN 98/86/7974 5 (L)  6 - 23 mg/dL Final   Creatinine, Ser 12/15/2023 0.83  0.40 - 1.50 mg/dL Final   Total Bilirubin 12/15/2023 0.6  0.2 - 1.2 mg/dL Final   Alkaline Phosphatase 12/15/2023 78  39 - 117 U/L Final   AST 12/15/2023 90 (H)  0 - 37 U/L Final   ALT 12/15/2023 38  0 - 53 U/L Final   Total Protein 12/15/2023 7.7  6.0 - 8.3 g/dL Final   Albumin  12/15/2023 4.1  3.5 - 5.2 g/dL Final   GFR 98/86/7974 96.72  >60.00 mL/min Final   Calcium  12/15/2023 8.9  8.4 - 10.5 mg/dL Final   Uric Acid, Serum 12/15/2023 9.1 (H)  4.0 - 7.8 mg/dL Final   WBC 98/86/7974 4.9  4.0 - 10.5 K/uL Final   RBC 12/15/2023 5.47  4.22 - 5.81 Mil/uL Final   Hemoglobin 12/15/2023 14.8  13.0 - 17.0 g/dL Final   HCT 98/86/7974 46.8  39.0 - 52.0 % Final   MCV 12/15/2023 85.5  78.0 - 100.0 fl Final   MCHC 12/15/2023 31.7  30.0 - 36.0 g/dL Final   RDW 98/86/7974 15.0  11.5 - 15.5 % Final   Platelets 12/15/2023 250.0  150.0 - 400.0 K/uL Final   Neutrophils  Relative % 12/15/2023 38.4 (L)  43.0 - 77.0 % Final   Lymphocytes Relative 12/15/2023 48.5 (H)  12.0 - 46.0 % Final   Monocytes Relative 12/15/2023 9.2  3.0 - 12.0 % Final   Eosinophils Relative 12/15/2023 2.2  0.0 - 5.0 % Final   Basophils Relative 12/15/2023 1.7  0.0 - 3.0 % Final   Neutro Abs 12/15/2023 1.9  1.4 - 7.7 K/uL Final   Lymphs Abs 12/15/2023 2.4  0.7 - 4.0 K/uL Final   Monocytes Absolute 12/15/2023 0.4  0.1 - 1.0 K/uL Final   Eosinophils Absolute 12/15/2023 0.1  0.0 - 0.7 K/uL Final   Basophils Absolute 12/15/2023 0.1  0.0 - 0.1 K/uL Final   VITD 12/15/2023 8.45 (L)  30.00 - 100.00 ng/mL Final   Magnesium  12/15/2023 1.4 (L)   1.5 - 2.5 mg/dL Final  Office Visit on 08/11/2023  Component Date Value Ref Range Status   Sodium 08/11/2023 144  135 - 145 mEq/L Final   Potassium 08/11/2023 3.5  3.5 - 5.1 mEq/L Final   Chloride 08/11/2023 98  96 - 112 mEq/L Final   CO2 08/11/2023 34 (H)  19 - 32 mEq/L Final   Glucose, Bld 08/11/2023 96  70 - 99 mg/dL Final   BUN 90/90/7975 8  6 - 23 mg/dL Final   Creatinine, Ser 08/11/2023 1.02  0.40 - 1.50 mg/dL Final   Total Bilirubin 08/11/2023 1.0  0.2 - 1.2 mg/dL Final   Alkaline Phosphatase 08/11/2023 83  39 - 117 U/L Final   AST 08/11/2023 88 (H)  0 - 37 U/L Final   ALT 08/11/2023 48  0 - 53 U/L Final   Total Protein 08/11/2023 7.3  6.0 - 8.3 g/dL Final   Albumin  08/11/2023 4.1  3.5 - 5.2 g/dL Final   GFR 90/90/7975 81.41  >60.00 mL/min Final   Calcium  08/11/2023 9.4  8.4 - 10.5 mg/dL Final   Magnesium  08/11/2023 1.4 (L)  1.5 - 2.5 mg/dL Final   Uric Acid, Serum 08/11/2023 9.8 (H)  4.0 - 7.8 mg/dL Final   WBC 90/90/7975 5.6  4.0 - 10.5 K/uL Final   RBC 08/11/2023 5.81  4.22 - 5.81 Mil/uL Final   Hemoglobin 08/11/2023 15.4  13.0 - 17.0 g/dL Final   HCT 90/90/7975 49.3  39.0 - 52.0 % Final   MCV 08/11/2023 84.8  78.0 - 100.0 fl Final   MCHC 08/11/2023 31.2  30.0 - 36.0 g/dL Final   RDW 90/90/7975 15.7 (H)  11.5 - 15.5 % Final   Platelets 08/11/2023 250.0  150.0 - 400.0 K/uL Final   Neutrophils Relative % 08/11/2023 56.4  43.0 - 77.0 % Final   Lymphocytes Relative 08/11/2023 28.5  12.0 - 46.0 % Final   Monocytes Relative 08/11/2023 12.0  3.0 - 12.0 % Final   Eosinophils Relative 08/11/2023 1.5  0.0 - 5.0 % Final   Basophils Relative 08/11/2023 1.6  0.0 - 3.0 % Final   Neutro Abs 08/11/2023 3.2  1.4 - 7.7 K/uL Final   Lymphs Abs 08/11/2023 1.6  0.7 - 4.0 K/uL Final   Monocytes Absolute 08/11/2023 0.7  0.1 - 1.0 K/uL Final   Eosinophils Absolute 08/11/2023 0.1  0.0 - 0.7 K/uL Final   Basophils Absolute 08/11/2023 0.1  0.0 - 0.1 K/uL Final   Ferritin 08/11/2023 265.7  22.0 -  322.0 ng/mL Final   Vitamin B-12 08/11/2023 176 (L)  211 - 911 pg/mL Final   Folate 08/11/2023 4.0 (L)  >5.9 ng/mL Final  Cholesterol 08/11/2023 202 (H)  0 - 200 mg/dL Final   Triglycerides 90/90/7975 183.0 (H)  0.0 - 149.0 mg/dL Final   HDL 90/90/7975 55.20  >39.00 mg/dL Final   VLDL 90/90/7975 36.6  0.0 - 40.0 mg/dL Final   LDL Cholesterol 08/11/2023 110 (H)  0 - 99 mg/dL Final   Total CHOL/HDL Ratio 08/11/2023 4   Final   NonHDL 08/11/2023 146.38   Final  There may be more visits with results that are not included.  No image results found. MYOCARDIAL AMYLOID IMAGING PLANAR AND SPECT Result Date: 08/24/2024   Findings are not suggestive of cardiac ATTR amyloidosis. The myocardium was negative for radiotracer uptake.   The visual grade of myocardial uptake relative to the ribs was Grade 0 (No myocardial uptake and normal bone uptake).   CT images were obtained for attenuation correction and were examined for the presence of coronary calcium  when appropriate.   Prior study not available for comparison.   XR Knee 1-2 Views Right Result Date: 06/08/2024 X-rays of the right knee show no acute findings.        ASSESSMENT & PLAN   Assessment & Plan Resistant hypertension Resistant hypertension   Hypertension remains uncontrolled despite five antihypertensive medications, with current readings in the high 130s. The target is <130/80 due to comorbid conditions. Lifestyle modifications have been implemented, but pharmacological intervention is necessary. Increase amlodipine  to 10 mg daily and refer to a hypertension specialist at the heart and vascular clinic. Alcohol use He is on naltrexone  to reduce alcohol cravings. Alcohol use exacerbates hypertension and stroke risk. He is advised to avoid alcohol to prevent complications. Continue naltrexone  to reduce alcohol cravings and refer to a pharmacist for medication management and cost reduction. History of stroke Chronic atrial fibrillation  (HCC) Atrial fibrillation on anticoagulation   Managed with Xarelto . There is a history of ischemic stroke possibly related to missed doses. Continued anticoagulation is crucial to prevent thromboembolic events. Continue Xarelto  for anticoagulation. Persistent vertigo is likely a residual effect of the stroke. He is not under neurologist care for this issue. Refer to neurology for evaluation and management of persistent vertigo post-stroke. Neuropathy Associated with obstructive sleep apnea untreated and alcohol use. Send to pain clinic Diabetic polyneuropathy causes significant discomfort, affecting sleep. Current management with gabapentin  is inadequate. He is not under pain specialist care. Refer to a pain specialist for management of diabetic polyneuropathy and consider a spinal stimulator for neuropathy if recommended by the pain specialist. OSA (obstructive sleep apnea) Obstructive sleep apnea, candidate for Inspire implant   Suspected to contribute to resistant hypertension and may be exacerbated by alcohol use. He is a candidate for the Inspire implant to alleviate symptoms and potentially reduce the need for antihypertensive medication. Refer to an ENT specialist for evaluation and potential Inspire implant surgery. Immunization due vaccine was given for flu   ORDER ASSOCIATIONS  #   DIAGNOSIS / CONDITION ICD-10 ENCOUNTER ORDER     ICD-10-CM   1. Resistant hypertension  I1A.0 amLODipine  (NORVASC ) 10 MG tablet    Ambulatory referral to [facility]    AMB Referral VBCI Care Management    2. Alcohol use  F10.90 naltrexone  (DEPADE) 50 MG tablet    3. History of stroke  Z86.73 naltrexone  (DEPADE) 50 MG tablet    Ambulatory referral to Neurology    4. Neuropathy  G62.9 Ambulatory referral to Pain Clinic    5. OSA (obstructive sleep apnea)  G47.33 Ambulatory referral to ENT  6. Immunization due  Z23 Flu vaccine trivalent PF, 6mos and  older(Flulaval,Afluria,Fluarix,Fluzone)    7. Chronic atrial fibrillation (HCC)  I48.20          Orders Placed in Encounter:   Meds ordered this encounter  Medications   amLODipine  (NORVASC ) 10 MG tablet    Sig: Take 1 tablet (10 mg total) by mouth daily.    Dispense:  90 tablet    Refill:  4   naltrexone  (DEPADE) 50 MG tablet    Sig: Take 0.5 tablets (25 mg total) by mouth daily.    Dispense:  90 tablet    Refill:  1    ED Discharge Orders          Ordered    amLODipine  (NORVASC ) 10 MG tablet  Daily        08/27/24 0933    Ambulatory referral to [facility]pert[facility] Clinic       Comments: Advanced Hypertension   08/27/24 0933    AMB Referral VBCI Care Management       Comments: Explain polypharmacy and suggest pill packs and cut costs due to fixed income   08/27/24 0939    naltrexone  (DEPADE) 50 MG tablet  Daily        08/27/24 0939    Ambulatory referral to Neurology       Comments: Persistent vertigo after CVA.   08/27/24 0941    Ambulatory referral to ENT       Comments: Evaluate for inspire procedure can't tolerate CPAP, has obstructive sleep apnea with uncontrolled hypertension   08/27/24 0952    Ambulatory referral to Pain Clinic        08/27/24 0952    Flu vaccine trivalent PF, 6mos and older(Flulaval,Afluria,Fluarix,Fluzone)        08/27/24 0957              This document was synthesized by artificial intelligence (Abridge) using HIPAA-compliant recording of the clinical interaction;   We discussed the use of AI scribe software for clinical note transcription with the patient, who gave verbal consent to proceed. additional Info: This encounter employed state-of-the-art, real-time, collaborative documentation. The patient actively reviewed and assisted in updating their electronic medical record on a shared screen, ensuring transparency and facilitating joint problem-solving for the problem list, overview, and plan. This approach promotes accurate, informed  care. The treatment plan was discussed and reviewed in detail, including medication safety, potential side effects, and all patient questions. We confirmed understanding and comfort with the plan. Follow-up instructions were established, including contacting the office for any concerns, returning if symptoms worsen, persist, or new symptoms develop, and precautions for potential emergency department visits.

## 2024-08-27 NOTE — Assessment & Plan Note (Signed)
 Obstructive sleep apnea, candidate for Inspire implant   Suspected to contribute to resistant hypertension and may be exacerbated by alcohol use. He is a candidate for the Inspire implant to alleviate symptoms and potentially reduce the need for antihypertensive medication. Refer to an ENT specialist for evaluation and potential Inspire implant surgery.

## 2024-08-27 NOTE — Patient Instructions (Signed)
 It was a pleasure seeing you today! Your health and satisfaction are our top priorities.  Bernardino Cone, MD  VISIT SUMMARY: Today, we discussed your ongoing health concerns, including your blood pressure management, sleep apnea, atrial fibrillation, stroke recovery, diabetic neuropathy, and alcohol use. We reviewed your current medications and made some adjustments to better control your conditions. Referrals to specialists were also made to provide you with comprehensive care.  YOUR PLAN: -RESISTANT HYPERTENSION: Your blood pressure remains high despite taking multiple medications. We aim to get your blood pressure below 130/80 mmHg. We will increase your amlodipine  dose to 10 mg daily and refer you to a hypertension specialist for further management.  -OBSTRUCTIVE SLEEP APNEA: Sleep apnea is a condition where your breathing stops and starts during sleep. It may be contributing to your high blood pressure. You are a candidate for the [facility] implant, which can help alleviate your symptoms. We will refer you to an ENT specialist to evaluate you for this implant.  -ATRIAL FIBRILLATION ON ANTICOAGULATION: Atrial fibrillation is an irregular heartbeat that can lead to blood clots. You are taking Xarelto  to prevent strokes. It is important to continue taking Xarelto  as prescribed to avoid another stroke.  -ISCHEMIC STROKE WITH PERSISTENT VERTIGO: You have ongoing vertigo, likely due to your previous stroke. We will refer you to a neurologist to help manage this condition.  -TYPE 2 DIABETES MELLITUS WITH DIABETIC POLYNEUROPATHY: Diabetic polyneuropathy is nerve damage caused by diabetes, leading to discomfort in your legs and feet. Your current medication, gabapentin , is not providing enough relief. We will refer you to a pain specialist to explore other treatment options, including a possible spinal stimulator.  -ALCOHOL USE DISORDER ON NALTREXONE : You are taking naltrexone  to help reduce your cravings  for alcohol. Avoiding alcohol is important to manage your blood pressure and reduce your risk of stroke. We will continue your naltrexone  and refer you to a pharmacist to help manage your medications and reduce costs.  INSTRUCTIONS: Please follow up with the hypertension specialist, ENT specialist, neurologist, and pain specialist as referred. Continue taking your medications as prescribed and avoid alcohol. If you have any new symptoms or concerns, please contact our office.  Your Providers PCP: Cone Bernardino MATSU, MD,  [phone]) Referring Provider: Cone Bernardino MATSU, MD,  [phone]) Care Team Provider: Shlomo Wilbert SAUNDERS, MD,  [phone]) Care Team Provider: Cherrie Toribio SAUNDERS, MD,  [phone])  NEXT STEPS: [x]  Early Intervention: Schedule sooner appointment, call our on-call services, or go to emergency room if there is any significant Increase in pain or discomfort New or worsening symptoms Sudden or severe changes in your health [x]  Flexible Follow-Up: We recommend a No follow-ups on file. for optimal routine care. This allows for progress monitoring and treatment adjustments. [x]  Preventive Care: Schedule your annual preventive care visit! It's typically covered by insurance and helps identify potential health issues early. [x]  Lab & X-ray Appointments: Incomplete tests scheduled today, or call to schedule. X-rays: [facility] (M-F, 8:30am-noon or 1pm-5pm). [x]  Medical Information Release: Sign a release form at front desk to obtain relevant medical information we don't have.  MAKING THE MOST OF OUR FOCUSED 20 MINUTE APPOINTMENTS: [x]   Clearly state your top concerns at the beginning of the visit to focus our discussion [x]   If you anticipate you will need more time, please inform the front desk during scheduling - we can book multiple appointments in the same week. [x]   If you have transportation problems- use our convenient video  appointments or ask about  transportation support. [x]   We can get down to business faster if you use MyChart to update information before the visit and submit non-urgent questions before your visit. Thank you for taking the time to provide details through MyChart.  Let our nurse know and she can import this information into your encounter documents.  Arrival and Wait Times: [x]   Arriving on time ensures that everyone receives prompt attention. [x]   Early morning (8a) and afternoon (1p) appointments tend to have shortest wait times. [x]   Unfortunately, we cannot delay appointments for late arrivals or hold slots during phone calls.  Getting Answers and Following Up [x]   Simple Questions & Concerns: For quick questions or basic follow-up after your visit, reach us  at [phone] or MyChart messaging. [x]   Complex Concerns: If your concern is more complex, scheduling an appointment might be best. Discuss this with the staff to find the most suitable option. [x]   Lab & Imaging Results: We'll contact you directly if results are abnormal or you don't use MyChart. Most normal results will be on MyChart within 2-3 business days, with a review message from Dr. Jesus. Haven't heard back in 2 weeks? Need results sooner? Contact us  at [phone]. [x]   Referrals: Our referral coordinator will manage specialist referrals. The specialist's office should contact you within 2 weeks to schedule an appointment. Call us  if you haven't heard from them after 2 weeks.  Staying Connected [x]   MyChart: Activate your MyChart for the fastest way to access results and message us . See the last page of this paperwork for instructions on how to activate.  Bring to Your Next Appointment [x]   Medications: Please bring all your medication bottles to your next appointment to ensure we have an accurate record of your prescriptions. [x]   Health Diaries: If you're monitoring any health conditions at home, keeping a diary of your readings can be very  helpful for discussions at your next appointment.  Billing [x]   X-ray & Lab Orders: These are billed by separate companies. Contact the invoicing company directly for questions or concerns. [x]   Visit Charges: Discuss any billing inquiries with our administrative services team.  Your Satisfaction Matters [x]   Share Your Experience: We strive for your satisfaction! If you have any complaints, or preferably compliments, please let Dr. Jesus know directly or contact our Practice Administrators, Manuelita Rubin or [employer], by asking at the front desk.   Reviewing Your Records [x]   Review this early draft of your clinical encounter notes below and the final encounter summary tomorrow on MyChart after its been completed.  All orders placed so far are visible here: Resistant hypertension -     amLODIPine  Besylate; Take 1 tablet (10 mg total) by mouth daily.  Dispense: 90 tablet; Refill: 4 -     Ambulatory referral to Advanced Hypertension Clinic -     AMB Referral VBCI Care Management  Alcohol use -     Naltrexone  HCl; Take 0.5 tablets (25 mg total) by mouth daily.  Dispense: 90 tablet; Refill: 1  History of stroke -     Naltrexone  HCl; Take 0.5 tablets (25 mg total) by mouth daily.  Dispense: 90 tablet; Refill: 1 -     Ambulatory referral to Neurology  Neuropathy -     Ambulatory referral to Pain Clinic  OSA (obstructive sleep apnea) -     Ambulatory referral to ENT  Immunization due -     Flu vaccine trivalent PF, 6mos and  older(Flulaval,Afluria,Fluarix,Fluzone)  Chronic atrial fibrillation (HCC)

## 2024-08-27 NOTE — Assessment & Plan Note (Signed)
 Resistant hypertension   Hypertension remains uncontrolled despite five antihypertensive medications, with current readings in the high 130s. The target is <130/80 due to comorbid conditions. Lifestyle modifications have been implemented, but pharmacological intervention is necessary. Increase amlodipine  to 10 mg daily and refer to a hypertension specialist at the heart and vascular clinic.

## 2024-08-27 NOTE — Assessment & Plan Note (Signed)
 Atrial fibrillation on anticoagulation   Managed with Xarelto . There is a history of ischemic stroke possibly related to missed doses. Continued anticoagulation is crucial to prevent thromboembolic events. Continue Xarelto  for anticoagulation. Persistent vertigo is likely a residual effect of the stroke. He is not under neurologist care for this issue. Refer to neurology for evaluation and management of persistent vertigo post-stroke.

## 2024-08-27 NOTE — Assessment & Plan Note (Signed)
 He is on naltrexone  to reduce alcohol cravings. Alcohol use exacerbates hypertension and stroke risk. He is advised to avoid alcohol to prevent complications. Continue naltrexone  to reduce alcohol cravings and refer to a pharmacist for medication management and cost reduction.

## 2024-08-30 ENCOUNTER — Telehealth: Payer: Self-pay | Admitting: *Deleted

## 2024-08-30 NOTE — Progress Notes (Unsigned)
 Care Guide Pharmacy Note  08/30/2024 Name: MICKEL SCHREUR MRN: [id number] DOB: [birth year]  Referred By: Jesus Bernardino MATSU, MD Reason for referral: Call Attempt #1 and Complex Care Management (Outreach to schedule referral with pharmacist )   PATTON RABINOVICH is a 58 y.o. year old male who is a primary care patient of Jesus Bernardino MATSU, MD.  Adriana FORBES Daring was referred to the pharmacist for assistance related to: HTN  An unsuccessful telephone outreach was attempted today to contact the patient who was referred to the pharmacy team for assistance with medication management. Additional attempts will be made to contact the patient.  Thedford Franks, CMA [place]  [facility], [facility] Guide Direct Dial: [phone]  Fax: [phone] Website: [email or web address]

## 2024-08-31 NOTE — Progress Notes (Signed)
 Care Guide Pharmacy Note  08/31/2024 Name: [name] MRN: [id number] DOB: 1966/10/01  Referred By: Jesus Bernardino MATSU, MD Reason for referral: Call Attempt #1 and Complex Care Management (Outreach to schedule referral with pharmacist )   Parker [name] is a 58 y.o. year old male who is a primary care patient of Jesus Bernardino MATSU, MD.  Adriana FORBES Daring was referred to the pharmacist for assistance related to: HTN  Successful contact was made with the patient to discuss pharmacy services including being ready for the pharmacist to call at least 5 minutes before the scheduled appointment time and to have medication bottles and any blood pressure readings ready for review. The patient agreed to meet with the pharmacist via telephone visit on 09/02/2024  Thedford Franks, CMA,  [place]  [facility], [facility] Guide Direct Dial: [phone]  Fax: [phone] Website: [email or web address]

## 2024-09-01 ENCOUNTER — Ambulatory Visit (HOSPITAL_COMMUNITY): Payer: Self-pay | Admitting: Family Medicine

## 2024-09-01 DIAGNOSIS — I83891 Varicose veins of right lower extremities with other complications: Secondary | ICD-10-CM | POA: Diagnosis not present

## 2024-09-02 ENCOUNTER — Telehealth (HOSPITAL_BASED_OUTPATIENT_CLINIC_OR_DEPARTMENT_OTHER): Payer: Self-pay | Admitting: Pharmacist

## 2024-09-02 ENCOUNTER — Encounter: Payer: Self-pay | Admitting: Pharmacist

## 2024-09-02 NOTE — Telephone Encounter (Signed)
 Attempt was made to contact patient by phone today for an initial visit with the Clinical Pharmacist regarding medication adherence, cost and polypharmacy.  Unable to reach patient. LM on VM with my contact number [phone].

## 2024-09-04 NOTE — Progress Notes (Signed)
 This encounter was created in error - please disregard.

## 2024-09-07 ENCOUNTER — Telehealth: Payer: Self-pay | Admitting: *Deleted

## 2024-09-07 NOTE — Progress Notes (Unsigned)
 Complex Care Management Care Guide Note  09/07/2024 Name: Parker [name] MRN: [id number] DOB: March 22, 1966  Parker [name] is a 58 y.o. year old male who is a primary care patient of Jesus Bernardino MATSU, MD and is actively engaged with the care management team. I reached out to Adriana FORBES Daring by phone today to assist with re-scheduling  with the Pharmacist.  Follow up plan: Unsuccessful telephone outreach attempt made. A HIPAA compliant phone message was left for the patient providing contact information and requesting a return call.  Thedford Franks, CMA [place]  [facility], [facility] Guide Direct Dial: [phone]  Fax: [phone] Website: [email or web address]

## 2024-09-08 DIAGNOSIS — I83892 Varicose veins of left lower extremities with other complications: Secondary | ICD-10-CM | POA: Diagnosis not present

## 2024-09-08 NOTE — Progress Notes (Signed)
 Complex Care Management Care Guide Note  09/08/2024 Name: Parker [name] MRN: [id number] DOB: 1966/02/24  Parker [name] is a 58 y.o. year old male who is a primary care patient of Jesus Bernardino MATSU, MD and is actively engaged with the care management team. I reached out to Adriana FORBES Daring by phone today to assist with re-scheduling  with the Pharmacist.  Follow up plan: Unsuccessful telephone outreach attempt made. A HIPAA compliant phone message was left for the patient providing contact information and requesting a return call. No further outreach attempts will be made due to inability to maintain patient contact.   Thedford Franks, CMA [place]  [facility], [facility] Guide Direct Dial: [phone]  Fax: [phone] Website: [email or web address]

## 2024-09-12 ENCOUNTER — Emergency Department (HOSPITAL_COMMUNITY)
Admission: EM | Admit: 2024-09-12 | Discharge: 2024-09-13 | Disposition: A | Discharge status: Home / Self Care | Attending: Emergency Medicine | Admitting: Emergency Medicine

## 2024-09-12 ENCOUNTER — Other Ambulatory Visit: Payer: Self-pay

## 2024-09-12 ENCOUNTER — Encounter (HOSPITAL_COMMUNITY): Payer: Self-pay | Admitting: *Deleted

## 2024-09-12 DIAGNOSIS — Z79899 Other long term (current) drug therapy: Secondary | ICD-10-CM | POA: Insufficient documentation

## 2024-09-12 DIAGNOSIS — R079 Chest pain, unspecified: Secondary | ICD-10-CM | POA: Diagnosis not present

## 2024-09-12 DIAGNOSIS — E1142 Type 2 diabetes mellitus with diabetic polyneuropathy: Secondary | ICD-10-CM | POA: Insufficient documentation

## 2024-09-12 DIAGNOSIS — I110 Hypertensive heart disease with heart failure: Secondary | ICD-10-CM | POA: Diagnosis not present

## 2024-09-12 DIAGNOSIS — R0602 Shortness of breath: Secondary | ICD-10-CM | POA: Diagnosis not present

## 2024-09-12 DIAGNOSIS — R0789 Other chest pain: Secondary | ICD-10-CM | POA: Diagnosis not present

## 2024-09-12 DIAGNOSIS — R42 Dizziness and giddiness: Secondary | ICD-10-CM | POA: Diagnosis not present

## 2024-09-12 DIAGNOSIS — I509 Heart failure, unspecified: Secondary | ICD-10-CM | POA: Diagnosis not present

## 2024-09-12 DIAGNOSIS — R197 Diarrhea, unspecified: Secondary | ICD-10-CM | POA: Diagnosis not present

## 2024-09-12 DIAGNOSIS — Z7901 Long term (current) use of anticoagulants: Secondary | ICD-10-CM | POA: Insufficient documentation

## 2024-09-12 DIAGNOSIS — Z7984 Long term (current) use of oral hypoglycemic drugs: Secondary | ICD-10-CM | POA: Insufficient documentation

## 2024-09-12 DIAGNOSIS — E039 Hypothyroidism, unspecified: Secondary | ICD-10-CM | POA: Insufficient documentation

## 2024-09-12 DIAGNOSIS — M7989 Other specified soft tissue disorders: Secondary | ICD-10-CM | POA: Insufficient documentation

## 2024-09-12 DIAGNOSIS — R112 Nausea with vomiting, unspecified: Secondary | ICD-10-CM | POA: Diagnosis not present

## 2024-09-12 LAB — CBC
HCT: 44.1 % (ref 39.0–52.0)
Hemoglobin: 15.5 g/dL (ref 13.0–17.0)
MCH: 28.7 pg (ref 26.0–34.0)
MCHC: 35.1 g/dL (ref 30.0–36.0)
MCV: 81.7 fL (ref 80.0–100.0)
Platelets: 234 K/uL (ref 150–400)
RBC: 5.4 MIL/uL (ref 4.22–5.81)
RDW: 14.9 % (ref 11.5–15.5)
WBC: 4.2 K/uL (ref 4.0–10.5)
nRBC: 0 % (ref 0.0–0.2)

## 2024-09-12 LAB — BASIC METABOLIC PANEL WITH GFR
Anion gap: 16 — ABNORMAL HIGH (ref 5–15)
BUN: <5 mg/dL — ABNORMAL LOW (ref 6–20)
CO2: 21 mmol/L — ABNORMAL LOW (ref 22–32)
Calcium: 8.5 mg/dL — ABNORMAL LOW (ref 8.9–10.3)
Chloride: 104 mmol/L (ref 98–111)
Creatinine, Ser: 0.78 mg/dL (ref 0.61–1.24)
GFR, Estimated: >60 mL/min (ref >60)
Glucose, Bld: 105 mg/dL — ABNORMAL HIGH (ref 70–99)
Potassium: 3.7 mmol/L (ref 3.5–5.1)
Sodium: 141 mmol/L (ref 135–145)

## 2024-09-12 LAB — D-DIMER, QUANTITATIVE: D-Dimer, Quant: 0.59 ug{FEU}/mL — ABNORMAL HIGH (ref 0.00–0.50)

## 2024-09-12 LAB — TROPONIN I (HIGH SENSITIVITY): Troponin I (High Sensitivity): 25 ng/L — ABNORMAL HIGH (ref <18)

## 2024-09-12 LAB — BRAIN NATRIURETIC PEPTIDE: B Natriuretic Peptide: 16.9 pg/mL (ref 0.0–100.0)

## 2024-09-12 MED ORDER — METHYLPREDNISOLONE SODIUM SUCC 40 MG IJ SOLR
40.0000 mg | Freq: Once | INTRAMUSCULAR | Status: AC
  Administered 2024-09-12: 40 mg via INTRAVENOUS
  Filled 2024-09-12: qty 1

## 2024-09-12 MED ORDER — DIPHENHYDRAMINE HCL 50 MG/ML IJ SOLN: 50.0000 mg | Freq: Once | INTRAMUSCULAR | Status: AC

## 2024-09-12 MED ORDER — DIPHENHYDRAMINE HCL 25 MG PO CAPS
50.0000 mg | ORAL_CAPSULE | Freq: Once | ORAL | Status: AC
  Administered 2024-09-13: 50 mg via ORAL
  Filled 2024-09-12: qty 2

## 2024-09-12 NOTE — ED Provider Notes (Incomplete)
 [place] EMERGENCY DEPARTMENT AT [facility] Provider Note   CSN: [id number] Arrival date & time: 09/12/24  2200     Patient presents with: Shortness of Breath   Parker [name] is a 58 y.o. male with history of diabetic polyneuropathy, type 2 diabetes, plantar fasciitis of right foot, heart failure with preserved ejection fraction, unspecified atrial fibrillation, hypothyroid, hypertension, vertigo, history of stroke, chronic neck pain, tricuspid valve regurgitation.  Patient presents to ED for evaluation of chest pain, shortness of breath.  States that this morning woke up in usual state of health.  States that around 630 this evening he developed chest pain located centrally and also on the left side of his chest.  He reports that he feels as if something is sitting on his chest.  He states he also feels slightly short of breath.  He reports that the symptoms have persisted which prompted him to drive himself to the ED.  He states has been slightly dizzy for the last 1 week.  Endorsing nausea and vomiting that occurred 4 days ago.  States he had some diarrhea 5 days ago.  He endorses leg swelling but reports it only occurs in his feet and has been ongoing for months.  He states that he takes torsemide  as needed and has not taken this medication recently.  Reports history of A-fib and reports that he does take anticoagulation and is compliant.  Reports history of stroke, states that in the past when he felt this way he ended up having a stroke.  When asked to press on my hand like gas pedals, patient reports he is unable to do so.  Patient did walk to the bed under his own power.   Shortness of Breath      Prior to Admission medications   Medication Sig Start Date End Date Taking? Authorizing Provider  albuterol  (VENTOLIN  HFA) 108 (90 Base) MCG/ACT inhaler Inhale 2 puffs into the lungs every 6 (six) hours as needed for shortness of breath. 09/18/22   [provider]   allopurinol  (ZYLOPRIM ) 300 MG tablet Take 1 tablet (300 mg total) by mouth daily. 05/05/24   Jeannetta Lonni ORN, MD  amiodarone  (PACERONE ) 200 MG tablet Take 100 mg by mouth daily.    [provider]  amLODipine  (NORVASC ) 10 MG tablet Take 1 tablet (10 mg total) by mouth daily. 08/27/24   Jesus Bernardino MATSU, MD  augmented betamethasone dipropionate (DIPROLENE-AF) 0.05 % cream Apply topically 2 (two) times daily as needed. 03/19/23   [provider]  Clotrimazole  1 % OINT Apply 1 Application topically 2 (two) times a week. Patient taking differently: Apply 1 Application topically 2 (two) times a week. As needed 04/07/23   Jesus Bernardino MATSU, MD  colchicine  0.6 MG tablet Take 1/2 tablet by mouth daily or increase to 1 tablet for up to 5 days for flare 05/05/24   Jeannetta Lonni ORN, MD  Cyanocobalamin  (B-12) 1000 MCG SUBL Place 1 tablet under the tongue daily at 6 (six) AM. 10/24/23   Jesus Bernardino MATSU, MD  cyclobenzaprine  (FLEXERIL ) 10 MG tablet Take 1-2 tablets (10-20 mg total) by mouth 3 (three) times daily as needed for muscle spasms. 02/27/24   Lucius Krabbe, NP  empagliflozin  (JARDIANCE ) 10 MG TABS tablet Take 1 tablet (10 mg total) by mouth daily. 02/11/24   Bensimhon, Toribio SAUNDERS, MD  fluticasone  (FLONASE ) 50 MCG/ACT nasal spray Place 2 sprays into both nostrils daily. Patient taking differently: Place 2 sprays into both nostrils  daily as needed for allergies. 09/16/22   Moishe Chiquita HERO, NP  folic acid  (FOLVITE ) 1 MG tablet Take 1 tablet (1 mg total) by mouth daily as needed (low iron). 10/24/23   Jesus Bernardino MATSU, MD  HYDROcodone -acetaminophen  (NORCO/VICODIN) 5-325 MG tablet Take 1 tablet by mouth daily as needed. 06/08/24   Jerri Kay HERO, MD  HYDROmorphone  (DILAUDID ) 2 MG tablet Take by mouth. 01/30/24   [provider]  ibuprofen  (ADVIL ) 800 MG tablet Take 1 tablet (800 mg total) by mouth every 8 (eight) hours as needed. 05/18/24   Jerri Kay HERO, MD  levothyroxine  (SYNTHROID ) 50  MCG tablet Take 1 tablet (50 mcg total) by mouth daily. Needs to be adjusted every 3-6 months based on labwork if still taking amiodarone  03/09/23   Jesus Bernardino MATSU, MD  Magnesium  Bisglycinate (MAG GLYCINATE) 100 MG TABS Take 1 tablet by mouth daily. 02/11/24   Bensimhon, Daniel R, MD  meclizine (ANTIVERT) 25 MG tablet Take 25 mg by mouth 3 (three) times daily as needed for dizziness. 07/07/22   [provider]  metoprolol  succinate (TOPROL -XL) 50 MG 24 hr tablet Take 1 tablet (50 mg total) by mouth daily. 03/09/23   Jesus Bernardino MATSU, MD  Multiple Vitamins-Minerals (MULTIVITAMIN WITH MINERALS) tablet Take 1 tablet by mouth daily.    [provider]  naltrexone  (DEPADE) 50 MG tablet Take 0.5 tablets (25 mg total) by mouth daily. 08/27/24   Jesus Bernardino MATSU, MD  potassium chloride  (KLOR-CON ) 10 MEQ tablet Take 2 tablets (20 mEq total) by mouth daily. 05/13/24   Rolan Ezra RAMAN, MD  pregabalin (LYRICA) 50 MG capsule Take 50 mg by mouth. 02/02/24   [provider]  rivaroxaban  (XARELTO ) 20 MG TABS tablet Take 1 tablet (20 mg total) by mouth daily with supper. 03/07/23   Jesus Bernardino MATSU, MD  rosuvastatin  (CRESTOR ) 20 MG tablet Take 1 tablet (20 mg total) by mouth daily. Replaces 10 mg dose, can cause cramps 10/24/23   Jesus Bernardino MATSU, MD  sacubitril -valsartan  (ENTRESTO ) 97-103 MG Take 1 tablet by mouth 2 (two) times daily. 01/20/24   Milford, Harlene HERO, FNP  spironolactone  (ALDACTONE ) 25 MG tablet Take 1 tablet (25 mg total) by mouth daily. 04/30/24   Bensimhon, Toribio SAUNDERS, MD  torsemide  (DEMADEX ) 20 MG tablet TAKE 1 TABLET BY MOUTH EVERY DAY Patient taking differently: Take 20 mg by mouth as needed. 12/14/23   Jesus Bernardino MATSU, MD  traMADol  (ULTRAM ) 50 MG tablet Take 1-2 tablets (50-100 mg total) by mouth daily as needed. 05/18/24   Jerri Kay HERO, MD  traZODone  (DESYREL ) 50 MG tablet Take 25 mg by mouth at bedtime as needed. 10/24/23   [provider]    Allergies: Iodinated  contrast media and Other    Review of Systems  Respiratory:  Positive for shortness of breath.     Updated Vital Signs BP (!) 163/112 (BP Location: Right Arm)   Pulse 90   Temp 98.3 F (36.8 C)   Resp 15   Ht 5' 11 (1.803 m)   Wt 103 kg   SpO2 98%   BMI 31.67 kg/m   Physical Exam  (all labs ordered are listed, but only abnormal results are displayed) Labs Reviewed  CBC  BASIC METABOLIC PANEL WITH GFR  BRAIN NATRIURETIC PEPTIDE  D-DIMER, QUANTITATIVE  TROPONIN I (HIGH SENSITIVITY)    EKG: None  Radiology: No results found.  {Document cardiac monitor, telemetry assessment procedure when appropriate:32947} Procedures   Medications Ordered in  the ED - No data to display    {Click here for ABCD2, HEART and other calculators REFRESH Note before signing:1}                              Medical Decision Making Amount and/or Complexity of Data Reviewed Labs: ordered.   ***  {Document critical care time when appropriate  Document review of labs and clinical decision tools ie CHADS2VASC2, etc  Document your independent review of radiology images and any outside records  Document your discussion with family members, caretakers and with consultants  Document social determinants of health affecting pt's care  Document your decision making why or why not admission, treatments were needed:32947:::1}   Final diagnoses:  None    ED Discharge Orders     None

## 2024-09-12 NOTE — ED Triage Notes (Signed)
 Pt reports CP and SHOB x24 hours. H/x CHF

## 2024-09-12 NOTE — ED Provider Notes (Signed)
 [place] EMERGENCY DEPARTMENT AT [facility] Provider Note   CSN: [id number] Arrival date & time: 09/12/24  2200     Patient presents with: Shortness of Breath   Parker [name] is a 58 y.o. male with history of diabetic polyneuropathy, type 2 diabetes, plantar fasciitis of right foot, heart failure with preserved ejection fraction, unspecified atrial fibrillation, hypothyroid, hypertension, vertigo, history of stroke, chronic neck pain, tricuspid valve regurgitation.  Patient presents to ED for evaluation of chest pain, shortness of breath.  States that this morning woke up in usual state of health.  States that around 630 this evening he developed chest pain located centrally and also on the left side of his chest.  He reports that he feels as if something is sitting on his chest.  He states he also feels slightly short of breath.  He reports that the symptoms have persisted which prompted him to drive himself to the ED.  He states has been slightly dizzy for the last 1 week.  Endorsing nausea and vomiting that occurred 4 days ago.  States he had some diarrhea 5 days ago.  He endorses leg swelling but reports it only occurs in his feet and has been ongoing for months.  He states that he takes torsemide  as needed and has not taken this medication recently.  Reports history of A-fib and reports that he does take anticoagulation and is compliant.  Reports history of stroke, states that in the past when he felt this way he ended up having a stroke.     Shortness of Breath Associated symptoms: chest pain        Prior to Admission medications   Medication Sig Start Date End Date Taking? Authorizing Provider  allopurinol  (ZYLOPRIM ) 300 MG tablet Take 1 tablet (300 mg total) by mouth daily. 05/05/24  Yes Rice, Lonni ORN, MD  amiodarone  (PACERONE ) 200 MG tablet Take 100 mg by mouth daily.   Yes [provider]  amLODipine  (NORVASC ) 10 MG tablet Take 1 tablet (10 mg total)  by mouth daily. 08/27/24  Yes Jesus Bernardino MATSU, MD  Ascorbic Acid (VITAMIN C PO) Take 1 tablet by mouth daily.   Yes [provider]  augmented betamethasone dipropionate (DIPROLENE-AF) 0.05 % cream Apply topically 2 (two) times daily as needed. 03/19/23  Yes [provider]  Cholecalciferol (VITAMIN D3 PO) Take 1 tablet by mouth daily.   Yes [provider]  colchicine  0.6 MG tablet Take 1/2 tablet by mouth daily or increase to 1 tablet for up to 5 days for flare 05/05/24  Yes Rice, Lonni ORN, MD  Cyanocobalamin  (B-12) 1000 MCG SUBL Place 1 tablet under the tongue daily at 6 (six) AM. 10/24/23  Yes Jesus Bernardino MATSU, MD  cyclobenzaprine  (FLEXERIL ) 10 MG tablet Take 1-2 tablets (10-20 mg total) by mouth 3 (three) times daily as needed for muscle spasms. 02/27/24  Yes Lucius Krabbe, NP  empagliflozin  (JARDIANCE ) 10 MG TABS tablet Take 1 tablet (10 mg total) by mouth daily. 02/11/24  Yes Bensimhon, Toribio SAUNDERS, MD  fluticasone  (FLONASE ) 50 MCG/ACT nasal spray Place 2 sprays into both nostrils daily. Patient taking differently: Place 2 sprays into both nostrils daily as needed for allergies. 09/16/22  Yes Moishe Chiquita HERO, NP  folic acid  (FOLVITE ) 1 MG tablet Take 1 tablet (1 mg total) by mouth daily as needed (low iron). 10/24/23  Yes Jesus Bernardino MATSU, MD  HYDROcodone -acetaminophen  (NORCO/VICODIN) 5-325 MG tablet Take 1 tablet by mouth daily as needed.  Patient not taking: Reported on 09/13/2024 06/08/24   Jerri Kay HERO, MD  levothyroxine  (SYNTHROID ) 50 MCG tablet Take 1 tablet (50 mcg total) by mouth daily. Needs to be adjusted every 3-6 months based on labwork if still taking amiodarone  03/09/23   Jesus Bernardino MATSU, MD  Magnesium  Bisglycinate (MAG GLYCINATE) 100 MG TABS Take 1 tablet by mouth daily. 02/11/24   Bensimhon, Daniel R, MD  meclizine (ANTIVERT) 25 MG tablet Take 25 mg by mouth 3 (three) times daily as needed for dizziness. 07/07/22   [provider]  metoprolol   succinate (TOPROL -XL) 50 MG 24 hr tablet Take 1 tablet (50 mg total) by mouth daily. 03/09/23   Jesus Bernardino MATSU, MD  Multiple Vitamins-Minerals (MULTIVITAMIN WITH MINERALS) tablet Take 1 tablet by mouth daily.    [provider]  naltrexone  (DEPADE) 50 MG tablet Take 0.5 tablets (25 mg total) by mouth daily. 08/27/24   Jesus Bernardino MATSU, MD  potassium chloride  (KLOR-CON ) 10 MEQ tablet Take 2 tablets (20 mEq total) by mouth daily. 05/13/24   Rolan Ezra RAMAN, MD  pregabalin (LYRICA) 50 MG capsule Take 50 mg by mouth. 02/02/24   [provider]  rivaroxaban  (XARELTO ) 20 MG TABS tablet Take 1 tablet (20 mg total) by mouth daily with supper. 03/07/23   Jesus Bernardino MATSU, MD  rosuvastatin  (CRESTOR ) 20 MG tablet Take 1 tablet (20 mg total) by mouth daily. Replaces 10 mg dose, can cause cramps 10/24/23   Jesus Bernardino MATSU, MD  sacubitril -valsartan  (ENTRESTO ) 97-103 MG Take 1 tablet by mouth 2 (two) times daily. 01/20/24   Glena Harlene HERO, FNP  spironolactone  (ALDACTONE ) 25 MG tablet Take 1 tablet (25 mg total) by mouth daily. 04/30/24   Bensimhon, Toribio SAUNDERS, MD  torsemide  (DEMADEX ) 20 MG tablet TAKE 1 TABLET BY MOUTH EVERY DAY Patient taking differently: Take 20 mg by mouth as needed. 12/14/23   Jesus Bernardino MATSU, MD  traMADol  (ULTRAM ) 50 MG tablet Take 1-2 tablets (50-100 mg total) by mouth daily as needed. 05/18/24   Jerri Kay HERO, MD  traZODone  (DESYREL ) 50 MG tablet Take 25 mg by mouth at bedtime as needed. 10/24/23   [provider]    Allergies: Iodinated contrast media and Other    Review of Systems  Respiratory:  Positive for shortness of breath.   Cardiovascular:  Positive for chest pain.  All other systems reviewed and are negative.   Updated Vital Signs BP (!) 178/133   Pulse 97   Temp 98.3 F (36.8 C) (Oral)   Resp 16   Ht 5' 11 (1.803 m)   Wt 103 kg   SpO2 100%   BMI 31.67 kg/m   Physical Exam Vitals and nursing note reviewed.  Constitutional:      General:  He is not in acute distress.    Appearance: He is well-developed.  HENT:     Head: Normocephalic and atraumatic.  Eyes:     Conjunctiva/sclera: Conjunctivae normal.  Cardiovascular:     Rate and Rhythm: Normal rate and regular rhythm.     Heart sounds: No murmur heard. Pulmonary:     Effort: Pulmonary effort is normal. No respiratory distress.     Breath sounds: Normal breath sounds.  Abdominal:     Palpations: Abdomen is soft.     Tenderness: There is no abdominal tenderness.  Musculoskeletal:        General: No swelling.     Cervical back: Neck supple.     Right lower leg:  No edema.     Left lower leg: No edema.  Skin:    General: Skin is warm and dry.     Capillary Refill: Capillary refill takes less than 2 seconds.  Neurological:     Mental Status: He is alert and oriented to person, place, and time. Mental status is at baseline.     Comments: CN II through XII intact.  Intact finger-nose, heel-to-shin.  No pronator drift.  No slurred speech.  Equal strength throughout.  Equal sensation throughout.  Psychiatric:        Mood and Affect: Mood normal.     (all labs ordered are listed, but only abnormal results are displayed) Labs Reviewed  BASIC METABOLIC PANEL WITH GFR - Abnormal; Notable for the following components:      Result Value   CO2 21 (*)    Glucose, Bld 105 (*)    BUN <5 (*)    Calcium  8.5 (*)    Anion gap 16 (*)    All other components within normal limits  D-DIMER, QUANTITATIVE - Abnormal; Notable for the following components:   D-Dimer, Quant 0.59 (*)    All other components within normal limits  TROPONIN I (HIGH SENSITIVITY) - Abnormal; Notable for the following components:   Troponin I (High Sensitivity) 25 (*)    All other components within normal limits  CBC  BRAIN NATRIURETIC PEPTIDE  TROPONIN I (HIGH SENSITIVITY)    EKG: None  Radiology: CT Angio Chest PE W and/or Wo Contrast Result Date: 09/13/2024 EXAM: CTA of the Chest with contrast  for PE 09/13/2024 04:15:40 AM TECHNIQUE: CTA of the chest was performed after the administration of 75 mL of iohexol  (OMNIPAQUE ) 350 MG/ML injection. Multiplanar reformatted images are provided for review. MIP images are provided for review. Automated exposure control, iterative reconstruction, and/or weight based adjustment of the mA/kV was utilized to reduce the radiation dose to as low as reasonably achievable. COMPARISON: Portable chest x-ray 02/14/2023. Prior CTA chest 01/24/2023. CLINICAL HISTORY: 58 year old male with suspected PE, positive D-dimer, chest pain, and shortness of breath. FINDINGS: PULMONARY ARTERIES: Pulmonary arteries are adequately opacified for evaluation. No pulmonary embolism. Main pulmonary artery is normal in caliber. Mild respiratory motion. MEDIASTINUM: Stable cardiomegaly. No pericardial effusion. No significant atherosclerosis of the thoracic aorta. LYMPH NODES: No mediastinal, hilar or axillary lymphadenopathy. LUNGS AND PLEURA: Mildly improved lung volumes. Symmetric mild atelectasis in both lungs. No focal consolidation or pulmonary edema. No pleural effusion or pneumothorax. UPPER ABDOMEN: Nodular liver contour suspicious for cirrhosis and more apparent than on the CTA last year. No splenomegaly. No discrete lesion identified in the partially visible liver. SOFT TISSUES AND BONES: Chronic flowing endplate osteophytes in the mid and lower thoracic spine with intermittent interbody ankylosis. No acute bone or soft tissue abnormality. IMPRESSION: 1. No acute pulmonary embolism. 2. No acute finding in the chest. Stable cardiomegaly. 3. Nodular liver contour suspicious for cirrhosis. Electronically signed by: Helayne Hurst MD 09/13/2024 04:39 AM EDT RP Workstation: [id number]    Procedures   Medications Ordered in the ED  amLODipine  (NORVASC ) tablet 10 mg (has no administration in time range)  metoprolol  tartrate (LOPRESSOR ) tablet 50 mg (has no administration in time range)   methylPREDNISolone  sodium succinate (SOLU-MEDROL ) 40 mg/mL injection 40 mg (40 mg Intravenous Given 09/12/24 2343)  diphenhydrAMINE  (BENADRYL ) capsule 50 mg (50 mg Oral Given 09/13/24 0258)    Or  diphenhydrAMINE  (BENADRYL ) injection 50 mg ( Intravenous See Alternative 09/13/24 0258)  iohexol  (OMNIPAQUE ) 350 MG/ML injection  75 mL (75 mLs Intravenous Contrast Given 09/13/24 0417)     Medical Decision Making Amount and/or Complexity of Data Reviewed Labs: ordered. Radiology: ordered.  Risk Prescription drug management.   58 year old male presenting to the ED due to concern of chest pain and shortness of breath.  On exam, the patient is afebrile and nontachycardic.  Lung sounds are clear bilaterally, no hypoxia.  Abdomen soft and compressible.  Neurological examination at baseline.  No edema to bilateral lower extremities.  Patient assessed utilizing CBC, BMP, BNP, D-dimer, troponin x 2, chest x-ray, EKG.  CBC without leukocytosis or anemia.  Metabolic panel without electrolyte derangement, slightly elevated anion gap 16.  BNP not elevated 16.9.  Troponin initially 25, delta 14.  D-dimer elevated 0.59.  CT obtained which shows no evidence of PE.  Does show evidence of ptosis and this was communicated to the patient.  Patient advised to follow-up outpatient regarding this.  At this time patient denies any active chest pain.  Reports he feels much better and does not have any shortness of breath.  His blood pressure is elevated at 178/133.  At most recent cardiology visit patient was diagnosed with resistant hypertension and was referred to hypertension specialist.  He was given home blood pressure medication amlodipine , metoprolol  here as he reports he has not taken blood pressure medication this evening.  Patient was then discharged at this time.  Advised to follow-up outpatient with cardiology.  Encouraged to return to the ED with any new or worsening symptoms.  Again, patient denies chest  pain at this time.  Stable to discharge.    Final diagnoses:  Shortness of breath  Chest pain, unspecified type    ED Discharge Orders     None          Ruthell Lonni FALCON, PA-C 09/13/24 0457    Darra Fonda MATSU, MD 09/14/24 [phone]

## 2024-09-13 ENCOUNTER — Emergency Department (HOSPITAL_COMMUNITY)

## 2024-09-13 DIAGNOSIS — R0602 Shortness of breath: Secondary | ICD-10-CM | POA: Diagnosis not present

## 2024-09-13 DIAGNOSIS — K7689 Other specified diseases of liver: Secondary | ICD-10-CM | POA: Diagnosis not present

## 2024-09-13 DIAGNOSIS — R079 Chest pain, unspecified: Secondary | ICD-10-CM | POA: Diagnosis not present

## 2024-09-13 DIAGNOSIS — I517 Cardiomegaly: Secondary | ICD-10-CM | POA: Diagnosis not present

## 2024-09-13 LAB — TROPONIN I (HIGH SENSITIVITY): Troponin I (High Sensitivity): 14 ng/L (ref <18)

## 2024-09-13 MED ORDER — METOPROLOL TARTRATE 25 MG PO TABS
50.0000 mg | ORAL_TABLET | Freq: Once | ORAL | Status: AC
  Administered 2024-09-13: 50 mg via ORAL
  Filled 2024-09-13: qty 2

## 2024-09-13 MED ORDER — IOHEXOL 350 MG/ML SOLN
75.0000 mL | Freq: Once | INTRAVENOUS | Status: AC | PRN
  Administered 2024-09-13: 75 mL via INTRAVENOUS

## 2024-09-13 MED ORDER — AMLODIPINE BESYLATE 5 MG PO TABS
10.0000 mg | ORAL_TABLET | Freq: Once | ORAL | Status: AC
  Administered 2024-09-13: 10 mg via ORAL
  Filled 2024-09-13: qty 2

## 2024-09-13 NOTE — Discharge Instructions (Addendum)
 It was a pleasure taking part in your care.  As we discussed, your work up appears reassuring.  Your D-dimer was elevated however no evidence of blood clot on your CT scan.  Please continue taking home blood pressure medication as prescribed.  Please follow-up with your cardiologist for further care.  You may also follow-up with your PCP.  If you develop any new chest pain or shortness of breath please return to the ED.

## 2024-09-15 ENCOUNTER — Other Ambulatory Visit

## 2024-09-15 ENCOUNTER — Encounter: Payer: Self-pay | Admitting: Neurology

## 2024-09-15 ENCOUNTER — Ambulatory Visit: Admitting: Neurology

## 2024-09-15 ENCOUNTER — Telehealth (HOSPITAL_COMMUNITY): Payer: Self-pay | Admitting: Cardiology

## 2024-09-15 VITALS — BP 143/101 | HR 86 | Ht 71.0 in | Wt 223.0 lb

## 2024-09-15 DIAGNOSIS — H814 Vertigo of central origin: Secondary | ICD-10-CM | POA: Diagnosis not present

## 2024-09-15 DIAGNOSIS — G621 Alcoholic polyneuropathy: Secondary | ICD-10-CM | POA: Diagnosis not present

## 2024-09-15 DIAGNOSIS — R202 Paresthesia of skin: Secondary | ICD-10-CM | POA: Diagnosis not present

## 2024-09-15 DIAGNOSIS — E1142 Type 2 diabetes mellitus with diabetic polyneuropathy: Secondary | ICD-10-CM | POA: Diagnosis not present

## 2024-09-15 NOTE — Telephone Encounter (Signed)
 Patient left VM on triage line reporting stabbing CP off and on for a few days. Reports he was seen in the ER and all was fine   Would like to discuss continued stabbing chest pains   Returned call for additional details No answer unable to leave message-VM full

## 2024-09-15 NOTE — Progress Notes (Signed)
 [facility] Clinic Note - Initial Visit   Date: 09/15/2024   TERALD JUMP MRN: [id number] DOB: [birth year]   Dear Dr. Jesus:  Thank you for your kind referral of DOROTEO NICKOLSON for consultation of vertigo. Although his history is well known to you, please allow us  to reiterate it for the purpose of our medical record. The patient was accompanied to the clinic by self.    FAIN FRANCIS is a 58 y.o. right-handed male with hypertension, history of embolic cerebellar stroke (2023), atrial fibrillation on Xeralto, prediabetes, alcohol use, gout, and neuropathy presenting for evaluation of vertigo.   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  He reports having chronic symptoms of dizziness/vertigo since this time.  Unfortunately, management is supportive and limited.  I offered vestibular therapy, which he declined at this time.  He has meclizine at home, which he has not used yet and recommend that he start to take this. 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    - Check vitamin B12, folate, vitamin B1, SPEP with IFE  - Advised to cut back, if not abstain from alcohol  - He is awaiting to establish care with pain management.   - Patient educated on daily foot inspection, fall prevention, and safety precautions around the home.  Return to clinic as needed  ------------------------------------------------------------- History of present illness: Patient was admitted at [facility] in August 2023 with right pontine, midbrain, and right cerebellar infarct in the setting of poor compliance with anticoagulation.  Since this time, he reports having residual dizziness and vertigo, which is described as combination of spinning, unsteadiness, and lightheadedness. He has fallen three times this year.  He walks unassisted.    He also complains of severe pain in the legs from neuropathy and is awaiting  appointment with pain management.  He had alcohol use and drinks 2-3 alcoholic beverages per day several days of the week.  His takes vitamin B12 and folic acid  supplements as his levels have been very low in the past.   He does not work.  He has been disability for the past two years.  He lives in a one level home by himself.     Out-side paper records, electronic medical record, and images have been reviewed where available and summarized as:  Lab Results  Component Value Date   HGBA1C 5.8 (H) 07/30/2024   Lab Results  Component Value Date   VITAMINB12 176 (L) 08/11/2023   Lab Results  Component Value Date   TSH 8.089 (H) 04/30/2024   Lab Results  Component Value Date   ESRSEDRATE 2 06/14/2024    Past Medical History:  Diagnosis Date   (HFpEF) heart failure with preserved ejection fraction (HCC)    Acute on chronic heart failure (HCC) 05/16/2022   AKI (acute kidney injury) 01/23/2023   Lab Results  Component  Value  Date     CREATININE  0.97  03/03/2023     CREATININE  1.14  02/19/2023     CREATININE  1.29 (H)  02/18/2023         Arthritis    knees. elbow, ankle   Cataract    CHF (congestive heart failure) (HCC)    Diabetes mellitus without complication (HCC)    Diastolic CHF (HCC) 02/14/2023   Dyspnea    when in A-fib   Dysrhythmia    A-fib   GERD (gastroesophageal reflux disease)    takes Dexilant prn   History of  COVID-19 05/18/2021   Formatting of this note might be different from the original. Not requiring hospitalization Formatting of this note might be different from the original. Not requiring hospitalization   Hypertension    takes Lisinopril -HCTZ daily   Hypothyroidism    Kidney injury 03/07/2023   Chronic kidney disease, stage 3a (ICD-10: N18.31): GFR 58.20 mL/min on 07/09/2023 indicates mild kidney dysfunction. Monitor closely and adjust medications as needed.            Lab Results      Component    Value    Date/Time           CREATININE    1.35     07/09/2023 02:47 PM           CREATININE    1.20    03/12/2023 12:10 PM           CREATININE    0.97    03/03/2023 02:39 PM           CREATI   Localized swelling of left lower extremity 01/11/2020   Localized swelling of right lower extremity 04/24/2020   Muscle spasm    takes Relafen  daily   OSA on CPAP 03/07/2023   does not use CPAP   Peripheral edema    Plantar fasciitis    right   Situational insomnia 01/06/2018   Sleep apnea    Sprain of deltoid ligament of left ankle 08/10/2019   Torn meniscus    right knee    Past Surgical History:  Procedure Laterality Date   CARDIOVERSION N/A 02/19/2023   Procedure: CARDIOVERSION;  Surgeon: Cherrie Toribio SAUNDERS, MD;  Location: [facility] ENDOSCOPY;  Service: Cardiovascular;  Laterality: N/A;   EAR CYST EXCISION N/A 08/06/2013   Procedure: CYST REMOVAL;  Surgeon: Merilee Kraft, MD;  Location: [facility] OR;  Service: ENT;  Laterality: N/A;   ESOPHAGOGASTRODUODENOSCOPY     left knee arthroscopy  2003   PAROTIDECTOMY Right 08/06/2013   Procedure: PAROTIDECTOMY;  Surgeon: Merilee Kraft, MD;  Location: [facility] OR;  Service: ENT;  Laterality: Right;   RIGHT HEART CATH N/A 02/17/2023   Procedure: RIGHT HEART CATH;  Surgeon: Cherrie Toribio SAUNDERS, MD;  Location: [facility] INVASIVE CV LAB;  Service: Cardiovascular;  Laterality: N/A;   SYNOVECTOMY Left 01/26/2024   Procedure: EXTENSOR TENOSYNOVECTOMY LEFT WRIST;  Surgeon: Murrell Drivers, MD;  Location: [facility];  Service: Orthopedics;  Laterality: Left;  Regional block   TEE WITHOUT CARDIOVERSION N/A 02/19/2023   Procedure: TRANSESOPHAGEAL ECHOCARDIOGRAM (TEE);  Surgeon: Cherrie Toribio SAUNDERS, MD;  Location: [facility] ENDOSCOPY;  Service: Cardiovascular;  Laterality: N/A;   TONSILLECTOMY Bilateral 08/06/2013   Procedure: TONSILLECTOMY;  Surgeon: Merilee Kraft, MD;  Location: [facility] OR;  Service: ENT;  Laterality: Bilateral;     Medications:  Outpatient Encounter Medications as of 09/15/2024  Medication Sig   allopurinol   (ZYLOPRIM ) 300 MG tablet Take 1 tablet (300 mg total) by mouth daily.   amiodarone  (PACERONE ) 200 MG tablet Take 100 mg by mouth daily.   amLODipine  (NORVASC ) 10 MG tablet Take 1 tablet (10 mg total) by mouth daily.   Ascorbic Acid (VITAMIN C PO) Take 1 tablet by mouth daily.   augmented betamethasone dipropionate (DIPROLENE-AF) 0.05 % cream Apply topically 2 (two) times daily as needed.   Cholecalciferol (VITAMIN D3 PO) Take 1 tablet by mouth daily.   colchicine  0.6 MG tablet Take 1/2 tablet by mouth daily or increase to 1 tablet for up to 5 days for flare  Cyanocobalamin  (B-12) 1000 MCG SUBL Place 1 tablet under the tongue daily at 6 (six) AM.   cyclobenzaprine  (FLEXERIL ) 10 MG tablet Take 1-2 tablets (10-20 mg total) by mouth 3 (three) times daily as needed for muscle spasms.   empagliflozin  (JARDIANCE ) 10 MG TABS tablet Take 1 tablet (10 mg total) by mouth daily.   fluticasone  (FLONASE ) 50 MCG/ACT nasal spray Place 2 sprays into both nostrils daily.   folic acid  (FOLVITE ) 1 MG tablet Take 1 tablet (1 mg total) by mouth daily as needed (low iron).   HYDROcodone -acetaminophen  (NORCO/VICODIN) 5-325 MG tablet Take 1 tablet by mouth daily as needed.   levothyroxine  (SYNTHROID ) 50 MCG tablet Take 1 tablet (50 mcg total) by mouth daily. Needs to be adjusted every 3-6 months based on labwork if still taking amiodarone    Magnesium  Bisglycinate (MAG GLYCINATE) 100 MG TABS Take 1 tablet by mouth daily.   meclizine (ANTIVERT) 25 MG tablet Take 25 mg by mouth 3 (three) times daily as needed for dizziness.   metoprolol  succinate (TOPROL -XL) 50 MG 24 hr tablet Take 1 tablet (50 mg total) by mouth daily.   Multiple Vitamins-Minerals (MULTIVITAMIN WITH MINERALS) tablet Take 1 tablet by mouth daily.   naltrexone  (DEPADE) 50 MG tablet Take 0.5 tablets (25 mg total) by mouth daily.   potassium chloride  (KLOR-CON ) 10 MEQ tablet Take 2 tablets (20 mEq total) by mouth daily.   pregabalin (LYRICA) 50 MG capsule  Take 50 mg by mouth.   rivaroxaban  (XARELTO ) 20 MG TABS tablet Take 1 tablet (20 mg total) by mouth daily with supper.   rosuvastatin  (CRESTOR ) 20 MG tablet Take 1 tablet (20 mg total) by mouth daily. Replaces 10 mg dose, can cause cramps   sacubitril -valsartan  (ENTRESTO ) 97-103 MG Take 1 tablet by mouth 2 (two) times daily.   spironolactone  (ALDACTONE ) 25 MG tablet Take 1 tablet (25 mg total) by mouth daily.   torsemide  (DEMADEX ) 20 MG tablet TAKE 1 TABLET BY MOUTH EVERY DAY   traMADol  (ULTRAM ) 50 MG tablet Take 1-2 tablets (50-100 mg total) by mouth daily as needed.   traZODone  (DESYREL ) 50 MG tablet Take 25 mg by mouth at bedtime as needed. (Patient taking differently: Take 50 mg by mouth at bedtime as needed for sleep.)   No facility-administered encounter medications on file as of 09/15/2024.    Allergies:  Allergies  Allergen Reactions   Iodinated Contrast Media Itching   Other Rash    Neoprene: Rash    Family History: Family History  Problem Relation Age of Onset   Heart Problems Mother    Diabetes Father     Social History: Social History   Tobacco Use   Smoking status: Never    Passive exposure: Past   Smokeless tobacco: Never  Vaping Use   Vaping status: Never Used  Substance Use Topics   Alcohol use: Yes    Alcohol/week: 6.0 standard drinks of alcohol    Types: 6 Standard drinks or equivalent per week    Comment: Occasional Drink   Drug use: No   Social History   Social History Narrative   Are you right handed or left handed? Right Handed   Are you currently employed ? No    What is your current occupation?   Do you live at home alone? Yes    Who lives with you?    What type of home do you live in: 1 story or 2 story? Lives in a one story home  Vital Signs:  BP (!) 143/101 (BP Location: Right Arm)   Pulse 86   Ht 5' 11 (1.803 m)   Wt 223 lb (101.2 kg)   SpO2 100%   BMI 31.10 kg/m    Neurological Exam: MENTAL STATUS including  orientation to time, place, person, recent and remote memory, attention span and concentration, language, and fund of knowledge is normal.  Speech is not dysarthric.  CRANIAL NERVES: II:  No visual field defects.     III-IV-VI: Pupils equal round and reactive to light.  Normal conjugate, extra-ocular eye movements in all directions of gaze.  Mild endgaze nystagmus bilaterally.  No ptosis.   V:  Normal facial sensation.    VII:  Normal facial symmetry and movements.   VIII:  Normal hearing and vestibular function.   IX-X:  Normal palatal movement.   XI:  Normal shoulder shrug and head rotation.   XII:  Normal tongue strength and range of motion, no deviation or fasciculation.  MOTOR:  There is postural hand tremor bilaterally, worse on the right.  No atrophy or fasciculations.  No pronator drift.   Upper Extremity:  Right  Left  Deltoid  5/5   5/5   Biceps  5/5   5/5   Triceps  5/5   5/5   Wrist extensors  5/5   5/5   Wrist flexors  5/5   5/5   Finger extensors  5/5   5/5   Finger flexors  5/5   5/5   Dorsal interossei  5/5   5/5   Abductor pollicis  5/5   5/5   Tone (Ashworth scale)  0  0   Lower Extremity:  Right  Left  Hip flexors  5/5   5/5   Knee flexors  5/5   5/5   Knee extensors  5/5   5/5   Dorsiflexors  5/5   5/5   Plantarflexors  5/5   5/5   Toe extensors  5/5   5/5   Toe flexors  5/5   5/5   Tone (Ashworth scale)  0  0   MSRs:                                           Right        Left brachioradialis 2+  2+  biceps 2+  2+  triceps 2+  2+  patellar 2+  2+  ankle jerk 0  0  Hoffman no  no  plantar response down  down   SENSORY:  Vibration reduced at the left ankle and absent at bilateral great toes.  Temperature and pin prick reduced in the feet bilaterally.  Rhomberg testing is positive.  COORDINATION/GAIT: Mild dysmetria with finger-to- nose-finger testing on the left.  Heel-to-shin is intact. Intact rapid alternating movements bilaterally. Gait is  wide-based, stable, unassisted.     Thank you for allowing me to participate in patient's care.  If I can answer any additional questions, I would be pleased to do so.    Sincerely,    Breckon Reeves K. Tobie, DO

## 2024-09-15 NOTE — Patient Instructions (Addendum)
 [facility] Physical Medicine & Rehabilitation [address] [place] [address] [place], [place] [address] [phone]

## 2024-09-17 ENCOUNTER — Ambulatory Visit: Payer: Self-pay | Admitting: Neurology

## 2024-09-20 ENCOUNTER — Telehealth: Payer: Self-pay

## 2024-09-20 NOTE — Telephone Encounter (Signed)
 Copied from CRM #[id number]. Topic: Referral - Question >> Sep 20, 2024  9:45 AM Laymon HERO wrote: Reason for CRM: Patient has not heard from referral from Neuropathy  G62.9 Ambulatory referral to Pain Clinic. He said he has called them twice and no one has returned his calls.

## 2024-09-20 NOTE — Progress Notes (Signed)
 Can we call patient about labs? There is still one pending, but his folate is very low. I recommend he supplement with folic acid  5 mg daily. This can be bought over the counter at any drug store or online.   Thank you.

## 2024-09-21 LAB — PROTEIN ELECTROPHORESIS, SERUM
Albumin ELP: 3.9 g/dL (ref 3.8–4.8)
Alpha 1: 0.4 g/dL — ABNORMAL HIGH (ref 0.2–0.3)
Alpha 2: 0.8 g/dL (ref 0.5–0.9)
Beta 2: 0.6 g/dL — ABNORMAL HIGH (ref 0.2–0.5)
Beta Globulin: 0.5 g/dL (ref 0.4–0.6)
Gamma Globulin: 1.1 g/dL (ref 0.8–1.7)
Total Protein: 7.3 g/dL (ref 6.1–8.1)

## 2024-09-21 LAB — B12 AND FOLATE PANEL
Folate: 1.9 ng/mL — ABNORMAL LOW
Vitamin B-12: 378 pg/mL (ref 200–1100)

## 2024-09-21 LAB — IMMUNOFIXATION ELECTROPHORESIS
IgM, Serum: 157 mg/dL (ref 50–300)
IgM, Serum: 1143 mg/dL (ref 600–300)
Immunoglobulin A: 1143 mg/dL — AB (ref 600–310)
Immunoglobulin A: 612 mg/dL — AB (ref 47–310)

## 2024-09-30 ENCOUNTER — Other Ambulatory Visit: Payer: Self-pay

## 2024-10-04 ENCOUNTER — Encounter: Payer: Self-pay | Admitting: Radiology

## 2024-10-04 ENCOUNTER — Ambulatory Visit: Payer: Self-pay | Admitting: Internal Medicine

## 2024-10-04 ENCOUNTER — Ambulatory Visit: Payer: Self-pay

## 2024-10-04 NOTE — Telephone Encounter (Signed)
 Answer Assessment - Initial Assessment Questions 1. REASON FOR CALL: What is the main reason for your call? or How can I best help you?     Foot pain-- patient has not been contacted by pain management to schedule.  Referral reviewed. Number found for the office to reach out to Pain Management to schedule and/or find out why no one has reached out to him to schedule.  Patient asking if the provider has signed off on hydrocodone . Advised it is still pending.Patient has not been getting sleep due to his pain and wants to start getting some relief until he can be seen by pain management. Please reach out to patient if/when sent in.  Protocols used: Information Only Call - No Triage-A-AH

## 2024-10-04 NOTE — Telephone Encounter (Signed)
 FYI Only or Action Required?: Action required by provider: medication refill request and clinical question for provider.  Patient was last seen in primary care on 08/27/2024 by Jesus Bernardino MATSU, MD.  Called Nurse Triage reporting Foot Pain.  Symptoms began about a month ago.  Interventions attempted: Nothing.  Symptoms are: gradually worsening.  Triage Disposition: See PCP When Office is Open (Within 3 Days)  Patient/caregiver understands and will follow disposition?: No, wishes to speak with PCP  Copied from CRM [id number]. Topic: Clinical - Red Word Triage >> Oct 04, 2024  9:31 AM Victoria A wrote: [facility] that prompted transfer to Nurse Triage: Patient is having feet pain and numbness. Has not heard anything from referral for pain. Reason for Disposition  Foot pain is a chronic symptom (recurrent or ongoing AND present > 4 weeks)  [1] Tingling in feet AND [2] new or increased  Answer Assessment - Initial Assessment Questions Patient reports seen MD twice regarding symptoms. Requesting referral for specialists for neuropathy pain, pain specialists. Reports neurologist does not see patient for neuropathy pain.  Patient declines appt.  Advised UC/ ED if symptoms worsen.  1. ONSET: When did the pain start?      Month ago 2. LOCATION: Where is the pain located?      Feet ; throbbing pain and numbness 3. PAIN: How bad is the pain?    (Scale 1-10; or mild, moderate, severe)     8/10; at night 10/10, severe pain and cannot sleep . CAUSE: What do you think is causing the foot pain?     Peripheral neuropathy 6. OTHER SYMPTOMS: Do you have any other symptoms? (e.g., leg pain, rash, fever, numbness) Intermittent pain in legs and hand, denies discolored, color to touch  Protocols used: Foot Pain-A-AH

## 2024-10-05 ENCOUNTER — Telehealth: Payer: Self-pay

## 2024-10-05 NOTE — Telephone Encounter (Signed)
 Parker [name] called today and I informed him of Dr. Leigh results. He reported that he is taking 1 mg of folic acid   I told him per Dr. Leigh he needs to start 5 mg daily. He understood.

## 2024-10-11 ENCOUNTER — Other Ambulatory Visit (HOSPITAL_COMMUNITY): Payer: Self-pay

## 2024-10-18 ENCOUNTER — Ambulatory Visit (INDEPENDENT_AMBULATORY_CARE_PROVIDER_SITE_OTHER)

## 2024-10-18 ENCOUNTER — Encounter (INDEPENDENT_AMBULATORY_CARE_PROVIDER_SITE_OTHER): Payer: Self-pay

## 2024-10-18 VITALS — BP 170/94 | HR 71 | Temp 98.7°F | Ht 71.0 in | Wt 222.0 lb

## 2024-10-18 DIAGNOSIS — Z8673 Personal history of transient ischemic attack (TIA), and cerebral infarction without residual deficits: Secondary | ICD-10-CM | POA: Diagnosis not present

## 2024-10-18 DIAGNOSIS — G629 Polyneuropathy, unspecified: Secondary | ICD-10-CM

## 2024-10-18 DIAGNOSIS — Z789 Other specified health status: Secondary | ICD-10-CM

## 2024-10-18 DIAGNOSIS — I1A0 Resistant hypertension: Secondary | ICD-10-CM

## 2024-10-18 DIAGNOSIS — G4733 Obstructive sleep apnea (adult) (pediatric): Secondary | ICD-10-CM | POA: Diagnosis not present

## 2024-10-18 NOTE — Progress Notes (Signed)
 Dear Dr. Jesus, Here is my assessment for our mutual patient, Parker [name]. Thank you for allowing me the opportunity to care for your patient. Please do not hesitate to contact me should you have any other questions. Sincerely, Dr. Penne Croak  Otolaryngology Clinic Note Referring provider: Dr. Jesus HPI:  Discussed the use of AI scribe software for clinical note transcription with the patient, who gave verbal consent to proceed.  History of Present Illness Parker [name] is a 58 year old male with moderate sleep apnea and peripheral neuropathy who presents for evaluation of sleep apnea. He was referred by Dr. Jesus for evaluation of sleep apnea.  Sleep-disordered breathing - Moderate obstructive sleep apnea diagnosed by home sleep study in May 2025 - Persistent sleep difficulties despite significant weight loss from 309 pounds to 222 pounds - Prior CPAP therapy attempted with multiple masks, unable to tolerate, device returned several years ago  Peripheral neuropathy - Numbness and pain in feet and hands, particularly at night - Symptoms interfere with ability to achieve restful sleep - Current medications include gabapentin  and pregabalin (Lyrica) without symptom relief - Referred to pain clinic but has not established care  Cerebrovascular disease sequelae - History of ischemic stroke two years ago after missing two days of anticoagulation therapy (Xarelto ) - Residual vertigo and memory impairment since the stroke  Hypertension - Chronic hypertension, currently managed with medication  Musculoskeletal limitations - Reduced physical activity due to cracked tibia and neuropathy - Focuses on upper body exercises  History of oropharyngeal and head/neck surgeries - Tonsillectomy in 2015 for recurrent strep throat - Cyst removal from back of head and parotid cyst removal near eye in 2015    Independent Review of Additional Tests or Records:  Reviewed external note  from referring PCP, Morrison,describing relevant history incorporated into today's evaluation. I personally reviewed and interpreted PSG 03/2023 - FINDINGS: 1. Moderate Obstructive Sleep Apnea with AHI 26.7/hr. 2. No Central Sleep Apnea with pAHIc 2.3/hr. 3. Oxygen desaturations as low as 75%.  PMH/Meds/All/SocHx/FamHx/ROS:   Past Medical History:  Diagnosis Date   (HFpEF) heart failure with preserved ejection fraction (HCC)    Acute on chronic heart failure (HCC) 05/16/2022   AKI (acute kidney injury) 01/23/2023   Lab Results  Component  Value  Date     CREATININE  0.97  03/03/2023     CREATININE  1.14  02/19/2023     CREATININE  1.29 (H)  02/18/2023         Arthritis    knees. elbow, ankle   Cataract    CHF (congestive heart failure) (HCC)    Diabetes mellitus without complication (HCC)    Diastolic CHF (HCC) 02/14/2023   Dyspnea    when in A-fib   Dysrhythmia    A-fib   GERD (gastroesophageal reflux disease)    takes Dexilant prn   History of COVID-19 05/18/2021   Formatting of this note might be different from the original. Not requiring hospitalization Formatting of this note might be different from the original. Not requiring hospitalization   Hypertension    takes Lisinopril -HCTZ daily   Hypothyroidism    Kidney injury 03/07/2023   Chronic kidney disease, stage 3a (ICD-10: N18.31): GFR 58.20 mL/min on 07/09/2023 indicates mild kidney dysfunction. Monitor closely and adjust medications as needed.            Lab Results      Component    Value    Date/Time  CREATININE    1.35    07/09/2023 02:47 PM           CREATININE    1.20    03/12/2023 12:10 PM           CREATININE    0.97    03/03/2023 02:39 PM           CREATI   Localized swelling of left lower extremity 01/11/2020   Localized swelling of right lower extremity 04/24/2020   Muscle spasm    takes Relafen  daily   OSA on CPAP 03/07/2023   does not use CPAP   Peripheral edema    Plantar fasciitis    right    Situational insomnia 01/06/2018   Sleep apnea    Sprain of deltoid ligament of left ankle 08/10/2019   Torn meniscus    right knee     Past Surgical History:  Procedure Laterality Date   CARDIOVERSION N/A 02/19/2023   Procedure: CARDIOVERSION;  Surgeon: Cherrie Toribio SAUNDERS, MD;  Location: [facility] ENDOSCOPY;  Service: Cardiovascular;  Laterality: N/A;   EAR CYST EXCISION N/A 08/06/2013   Procedure: CYST REMOVAL;  Surgeon: Merilee Kraft, MD;  Location: [facility] OR;  Service: ENT;  Laterality: N/A;   ESOPHAGOGASTRODUODENOSCOPY     left knee arthroscopy  2003   PAROTIDECTOMY Right 08/06/2013   Procedure: PAROTIDECTOMY;  Surgeon: Merilee Kraft, MD;  Location: [facility] OR;  Service: ENT;  Laterality: Right;   RIGHT HEART CATH N/A 02/17/2023   Procedure: RIGHT HEART CATH;  Surgeon: Cherrie Toribio SAUNDERS, MD;  Location: [facility] INVASIVE CV LAB;  Service: Cardiovascular;  Laterality: N/A;   SYNOVECTOMY Left 01/26/2024   Procedure: EXTENSOR TENOSYNOVECTOMY LEFT WRIST;  Surgeon: Murrell Drivers, MD;  Location: [facility];  Service: Orthopedics;  Laterality: Left;  Regional block   TEE WITHOUT CARDIOVERSION N/A 02/19/2023   Procedure: TRANSESOPHAGEAL ECHOCARDIOGRAM (TEE);  Surgeon: Cherrie Toribio SAUNDERS, MD;  Location: [facility] ENDOSCOPY;  Service: Cardiovascular;  Laterality: N/A;   TONSILLECTOMY Bilateral 08/06/2013   Procedure: TONSILLECTOMY;  Surgeon: Merilee Kraft, MD;  Location: [facility] OR;  Service: ENT;  Laterality: Bilateral;    Family History  Problem Relation Age of Onset   Heart Problems Mother    Diabetes Father      Social Connections: Socially Isolated (02/02/2024)   Social Connection and Isolation Panel    Frequency of Communication with Friends and Family: More than three times a week    Frequency of Social Gatherings with Friends and Family: Three times a week    Attends Religious Services: Never    Active Member of Clubs or Organizations: No    Attends Banker Meetings: Never    Marital  Status: Never married      Current Outpatient Medications:    allopurinol  (ZYLOPRIM ) 300 MG tablet, Take 1 tablet (300 mg total) by mouth daily., Disp: 90 tablet, Rfl: 0   amiodarone  (PACERONE ) 200 MG tablet, Take 100 mg by mouth daily., Disp: , Rfl:    amLODipine  (NORVASC ) 10 MG tablet, Take 1 tablet (10 mg total) by mouth daily., Disp: 90 tablet, Rfl: 4   Ascorbic Acid (VITAMIN C PO), Take 1 tablet by mouth daily., Disp: , Rfl:    augmented betamethasone dipropionate (DIPROLENE-AF) 0.05 % cream, Apply topically 2 (two) times daily as needed., Disp: , Rfl:    Cholecalciferol (VITAMIN D3 PO), Take 1 tablet by mouth daily., Disp: , Rfl:    colchicine  0.6 MG tablet, Take 1/2 tablet by mouth daily or  increase to 1 tablet for up to 5 days for flare, Disp: 45 tablet, Rfl: 0   Cyanocobalamin  (B-12) 1000 MCG SUBL, Place 1 tablet under the tongue daily at 6 (six) AM., Disp: 90 tablet, Rfl: 3   cyclobenzaprine  (FLEXERIL ) 10 MG tablet, Take 1-2 tablets (10-20 mg total) by mouth 3 (three) times daily as needed for muscle spasms., Disp: 30 tablet, Rfl: 0   empagliflozin  (JARDIANCE ) 10 MG TABS tablet, Take 1 tablet (10 mg total) by mouth daily., Disp: 90 tablet, Rfl: 3   fluticasone  (FLONASE ) 50 MCG/ACT nasal spray, Place 2 sprays into both nostrils daily., Disp: 16 g, Rfl: 0   folic acid  (FOLVITE ) 1 MG tablet, Take 1 tablet (1 mg total) by mouth daily as needed (low iron)., Disp: 90 tablet, Rfl: 3   HYDROcodone -acetaminophen  (NORCO/VICODIN) 5-325 MG tablet, Take 1 tablet by mouth daily as needed., Disp: 10 tablet, Rfl: 0   levothyroxine  (SYNTHROID ) 50 MCG tablet, Take 1 tablet (50 mcg total) by mouth daily. Needs to be adjusted every 3-6 months based on labwork if still taking amiodarone , Disp: 90 tablet, Rfl: 3   Magnesium  Bisglycinate (MAG GLYCINATE) 100 MG TABS, Take 1 tablet by mouth daily., Disp: 90 tablet, Rfl: 3   meclizine (ANTIVERT) 25 MG tablet, Take 25 mg by mouth 3 (three) times daily as needed  for dizziness., Disp: , Rfl:    metoprolol  succinate (TOPROL -XL) 50 MG 24 hr tablet, Take 1 tablet (50 mg total) by mouth daily., Disp: 90 tablet, Rfl: 3   Multiple Vitamins-Minerals (MULTIVITAMIN WITH MINERALS) tablet, Take 1 tablet by mouth daily., Disp: , Rfl:    naltrexone  (DEPADE) 50 MG tablet, Take 0.5 tablets (25 mg total) by mouth daily., Disp: 90 tablet, Rfl: 1   potassium chloride  (KLOR-CON ) 10 MEQ tablet, Take 2 tablets (20 mEq total) by mouth daily., Disp: 180 tablet, Rfl: 3   pregabalin (LYRICA) 50 MG capsule, Take 50 mg by mouth., Disp: , Rfl:    rivaroxaban  (XARELTO ) 20 MG TABS tablet, Take 1 tablet (20 mg total) by mouth daily with supper., Disp: 90 tablet, Rfl: 3   rosuvastatin  (CRESTOR ) 20 MG tablet, Take 1 tablet (20 mg total) by mouth daily. Replaces 10 mg dose, can cause cramps, Disp: 90 tablet, Rfl: 3   sacubitril -valsartan  (ENTRESTO ) 97-103 MG, Take 1 tablet by mouth 2 (two) times daily., Disp: 60 tablet, Rfl: 11   spironolactone  (ALDACTONE ) 25 MG tablet, Take 1 tablet (25 mg total) by mouth daily., Disp: 90 tablet, Rfl: 3   torsemide  (DEMADEX ) 20 MG tablet, TAKE 1 TABLET BY MOUTH EVERY DAY, Disp: 90 tablet, Rfl: 1   traMADol  (ULTRAM ) 50 MG tablet, Take 1-2 tablets (50-100 mg total) by mouth daily as needed., Disp: 20 tablet, Rfl: 0   traZODone  (DESYREL ) 50 MG tablet, Take 25 mg by mouth at bedtime as needed. (Patient taking differently: Take 50 mg by mouth at bedtime as needed for sleep.), Disp: , Rfl:    Physical Exam:     The patient was awake, alert, and appropriate. The external ears were inspected, and otoscopy was performed to evaluate the external auditory canals and tympanic membranes. The nasal cavity and septum were examined for mucosal changes, obstruction, or discharge. The oral cavity and oropharynx were inspected for mucosal lesions, infection, or tonsillar hypertrophy. The neck was palpated for lymphadenopathy, thyroid  abnormalities, or other masses. Cranial  nerve function was grossly intact.  Pertinent Findings: Physical Exam BP (!) 170/94 Comment: first attempt 164/103  Pulse 71  Temp 98.7 F (37.1 C)   Ht 5' 11 (1.803 m)   Wt 222 lb (100.7 kg)   SpO2 98%   BMI 30.96 kg/m  HEENT: Dry skin in ears, possible eczema. Nose normal. Large tongue. Tonsils absent. Mallampati 3.   Seprately Identifiable Procedures:  I personally ordered, reviewed and interpreted the following with the patient today  Procedure Note Pre-procedure diagnosis:  OSA Post-procedure diagnosis: Same Procedure: Transnasal Fiberoptic Laryngoscopy, CPT 31575 - Mod 25 Indication: 37M with OSA Complications: None apparent EBL: 0 mL  The procedure was undertaken to further evaluate the patient's complaint of obstructive sleep apnea, with mirror exam inadequate for appropriate examination due to gag reflex and poor patient tolerance  Procedure:  Patient was identified as correct patient. Verbal consent was obtained. The nose was sprayed with oxymetazoline  and 4% lidocaine . The The flexible laryngoscope was passed through the nose to view the nasal cavity, pharynx (oropharynx, hypopharynx) and larynx.  The larynx was examined at rest and during multiple phonatory tasks. Documentation was obtained and reviewed with patient. The scope was removed. The patient tolerated the procedure well.  Findings: The nasal cavity and nasopharynx did not reveal any masses or lesions, mucosa appeared to be without obvious lesions. The tongue base, pharyngeal walls, piriform sinuses, vallecula, epiglottis and postcricoid region are normal in appearance EXCEPT: none. The visualized portion of the subglottis and proximal trachea is widely patent. The vocal folds are mobile bilaterally. There are no lesions on the free edge of the vocal folds nor elsewhere in the larynx worrisome for malignancy.    Electronically signed by: Penne Croak, DO 10/18/2024 1:50 PM   Impression & Plans:  Parker [name] is a 58 y.o. male  1. OSA (obstructive sleep apnea)   2. Intolerance of continuous positive airway pressure (CPAP) ventilation   3. Neuropathy   4. History of stroke   5. Resistant hypertension     - Findings and diagnoses discussed in detail with the patient. - Risks, benefits, and alternatives were reviewed. Through shared decision making, the patient elects to proceed with below. Assessment & Plan Obstructive sleep apnea, moderate Moderate obstructive sleep apnea with severe snoring and oxygen desaturation to 75%. Previous CPAP trials failed due to mask intolerance. Untreated condition increases risk for stroke, heart disease, and hypertension. He preferred non-invasive options over surgical interventions. - Ordered evaluation for hypoglossal nerve stimulation candidacy. - Discussed oral appliance as alternative treatment.  Evaluation for candidacy for hypoglossal nerve stimulation (Inspire device) for obstructive sleep apnea Evaluation involves sleep study with nasal endoscopy to assess airway collapse and tongue position. Inspire device stimulates hypoglossal nerve to prevent obstruction. He expressed concerns about surgical risks and stroke risk from stopping blood thinners. - Scheduled evaluation for Inspire device candidacy. - Discussed risks and benefits of Inspire device and alternatives. - Provided information on oral appliance as non-surgical option.  We had a discussion regarding risks of Hypoglossal Nerve Stimulator Placement including lack of benefit, persistent symptoms, pneumothorax, tongue soreness or weakness, Floor of mouth numbness, injury to major vessels, hematoma, implant infection, bleeding, scarring, tethering of neck, persistent symptoms, need for further procedures, and risk of anesthesia among others.   Neuropathy - follow up with neuropathy clinic  - continue pregabalin  - Orders placed:  Orders Placed This Encounter  Procedures   Ambulatory Referral  For Surgery Scheduling   - Medications prescribed/continued/adjusted: No orders of the defined types were placed in this encounter.  - Education materials provided to the patient. -  Follow up: schedule at earliest mutual convenience. Patient instructed to return sooner or go to the ED if new/worsening symptoms develop.   Thank you for allowing me the opportunity to care for your patient. Please do not hesitate to contact me should you have any other questions.  Sincerely, Penne Croak, DO Otolaryngologist (ENT) [facility] Phone: [phone] Fax: [phone]  10/18/2024, 1:50 PM   I spent 70 minutes (exclusive of separately billable procedures) on the day of the encounter reviewing the patient's chart, seeing the patient face to face, discussing the findings and the treatment plan, and documenting in the EHR.

## 2024-10-18 NOTE — Patient Instructions (Signed)
 ### Preop Inspire Instructions     **Preoperative Discharge Instructions: Inspire Hypoglossal Nerve Stimulator Placement**      - **Procedure Overview:**        The Inspire hypoglossal nerve stimulator is an FDA-approved surgical therapy for select patients with moderate to severe obstructive sleep apnea (OSA) who are unable to tolerate or adhere to positive airway pressure therapy. The device stimulates the hypoglossal nerve to improve upper airway patency during sleep.[1][2][3]      - **Preoperative Evaluation:**        - All candidates must undergo comprehensive evaluation, including polysomnography to confirm OSA severity and exclusion of central or mixed sleep apnea.[2][3][4]      - Drug-induced sleep endoscopy (DISE) is required prior to implantation. DISE is performed under sedation to directly visualize the pattern of upper airway collapse.        - DISE is essential to exclude patients with complete concentric collapse of the soft palate, which predicts poor response to hypoglossal nerve stimulation.[2][5][3][4][6][7]      - DISE also helps identify the primary site of obstruction (e.g., tongue base, palate, lateral walls) and guides surgical planning.[5][6]      - The VOTE classification (Velum, Oropharynx, Tongue base, Epiglottis) is used to document findings.[5][7]      - **Preoperative Instructions:**        - Continue routine medications unless otherwise directed.      - Fast as instructed prior to DISE (typically no food or drink for 6-8 hours before the procedure).      - Arrange for transportation home after DISE, as sedation will be used.      - Notify the surgical team of any changes in health status, new medications, or allergies.      - **Risks and Expectations:**        - DISE is generally well tolerated; risks include transient hypoxemia, aspiration, or adverse reaction to sedation.      - The results of DISE will determine eligibility for Inspire  implantation. Patients with complete concentric palatal collapse or unfavorable anatomy will not be offered the device.[2][5][3][4][6][7]      - If eligible, the surgical team will discuss the next steps, including scheduling for device implantation and perioperative planning.      - **Follow-Up:**        - After DISE, expect a follow-up visit to review findings and finalize candidacy.      - If proceeding to surgery, further instructions regarding perioperative care and device activation will be provided.      - **Contact Information:**        - For questions or concerns prior to DISE or surgery, contact the otolaryngology clinic.      **Summary:**      Drug-induced sleep endoscopy is a mandatory step in the preoperative evaluation for Inspire hypoglossal nerve stimulator placement. It ensures appropriate patient selection and optimizes surgical outcomes by identifying airway collapse patterns and excluding patients unlikely to benefit from therapy.[2][5][3][4][6][7]      ### References  1. Hypoglossal Nerve Stimulator: A Novel Treatment Approach for OSA - Overview of Treatment, Including Diagnostic and Patient Criteria and Procedural Terminology Codes. Norval JULIANNA Gaines N. Chest. 2021;160(4):1406-1412. doi:10.1016/j.chest.2021.05.039. 2. Upper-Airway Stimulation for Obstructive Sleep Apnea. Strollo PJ, Soose RJ, Maurer JT, et al. The [place] England Journal of Medicine. 2014;370(2):139-49. doi:10.1056/NEJMoa1308659. 3. Diagnosis and Management of Obstructive Sleep Apnea: A Review. Lora HIKES, [place] NM. JAMA. 2020;323(14):1389-1400. doi:10.1001/jama.7979.6485. 4. Evaluation of Hypoglossal Nerve Stimulation Treatment in Obstructive  Sleep Apnea. [address] DT, Carden KA, Wang L, Lindsell CJ, [place]. JAMA Otolaryngology-- Head & Neck Surgery. 2019;145(11):1044-1052. doi:10.1001/jamaoto.7980.7276. 5. Drug-Induced Sleep Endoscopy and Hypoglossal Nerve Stimulation Outcomes: A Multicenter Cohort Study.  Vannie SHAUNNA Drape DT, D'Agostino MA, et al. [employer]. 2021;131(7):1676-1682. doi:10.1002/lary.70603. 6. Success of Hypoglossal Nerve Stimulation Using Mandibular Advancement During Sleep Endoscopy. [place], [place] RC. The Laryngoscope. 2020;130(12):2917-2921. doi:10.1002/lary.71410. 7. Evaluation of Upper Airway Stimulation for Adolescents With Down Syndrome and Obstructive Sleep Apnea. Babara AMBLE, Stenerson M, Ishman SL, et al. JAMA Otolaryngology-- Head & Neck Surgery. 2022;148(6):522-528. doi:10.1001/jamaoto.2022.0455.

## 2024-10-27 ENCOUNTER — Telehealth: Payer: Self-pay | Admitting: Pharmacist

## 2024-10-27 NOTE — Telephone Encounter (Signed)
 Called patient to try to connect - he missed appointment with Clinical Pharmacist in October and schedulers outreach to reschedule had been unsuccessful.  I did reach patient today and we began to discuss medication cost. He currently had [employer] Heart and Diabetes plan - almost all of his medications are $0 with the exception for Xarelto  which is $47 per month.  I discussed the option of applying for a [employer] Grant for cardiomyopathy which would help with this copay however patient declined. He states that he is trying to decrease the number of medications he is taking because of [employer] and the government. He had questions about why he had to take Xarelto  and metoprolol . Explained what each medication was for and their benefits. Patient is concerned about their potential effects on libido / ED.  Patient then turned conversation to religious and political discussion and declined to further discuss medications and declined adherence packaging.  I sent patient a MyChart message with my contact information. He can contact me if he would like assistance in the future.

## 2024-11-01 ENCOUNTER — Telehealth: Payer: Self-pay | Admitting: Internal Medicine

## 2024-11-01 ENCOUNTER — Telehealth: Payer: Self-pay

## 2024-11-01 NOTE — Telephone Encounter (Signed)
 Copied from CRM #[id number]. Topic: Referral - Status >> Nov 01, 2024 10:50 AM Delon DASEN wrote: Reason for CRM: Patient still waiting for referral for pain management for neuropathy- [phone]

## 2024-11-01 NOTE — Telephone Encounter (Signed)
 Copied from CRM #[id number]. Topic: General - Other >> Nov 01, 2024 10:47 AM Delon DASEN wrote: Reason for CRM: form for handicap placard needs to be redone, the doctor signed patient's name and they will not accept it  Sent pt message via my chart if would like to come back by office to pick up or by maiil.

## 2024-11-01 NOTE — Telephone Encounter (Unsigned)
 Copied from CRM #[id number]. Topic: Clinical - Medication Refill >> Nov 01, 2024 10:46 AM Delon T wrote: Does not remember who prescribed this medication Medication: meclizine (ANTIVERT) 25 MG tablet  Has the patient contacted their pharmacy? Yes (Agent: If no, request that the patient contact the pharmacy for the refill. If patient does not wish to contact the pharmacy document the reason why and proceed with request.) (Agent: If yes, when and what did the pharmacy advise?)  This is the patient's preferred pharmacy:  [id number] [address], [place] - [address] [place] [place] [address] Phone: [phone] Fax: [phone]  Is this the correct pharmacy for this prescription? Yes If no, delete pharmacy and type the correct one.   Has the prescription been filled recently? Yes  Is the patient out of the medication? Yes  Has the patient been seen for an appointment in the last year OR does the patient have an upcoming appointment? Yes  Can we respond through MyChart? Yes  Agent: Please be advised that Rx refills may take up to 3 business days. We ask that you follow-up with your pharmacy.

## 2024-11-01 NOTE — Telephone Encounter (Signed)
Sent pt via mychart

## 2024-11-02 ENCOUNTER — Telehealth: Payer: Self-pay

## 2024-11-02 MED ORDER — MECLIZINE HCL 25 MG PO TABS: 25.0000 mg | ORAL_TABLET | Freq: Three times a day (TID) | ORAL | 0 refills | Status: AC | PRN

## 2024-11-02 NOTE — Telephone Encounter (Signed)
 Spoke with pt letting him know that his placard form is ready will place in front office to be mail. Also pt is concerned about referral to pain clinic he called and they told him they have to review then they will let him now if they will accept him or not. Pt would like to know what can he do til then

## 2024-11-02 NOTE — Telephone Encounter (Signed)
 Error

## 2024-11-03 NOTE — Progress Notes (Signed)
 Brianna at Dr Vergie office notified of need for cards clearance and Xarelto  orders for Drug induced endoscopy procedure.

## 2024-11-05 ENCOUNTER — Telehealth (INDEPENDENT_AMBULATORY_CARE_PROVIDER_SITE_OTHER): Payer: Self-pay

## 2024-11-05 ENCOUNTER — Encounter (INDEPENDENT_AMBULATORY_CARE_PROVIDER_SITE_OTHER): Payer: Self-pay

## 2024-11-05 NOTE — Telephone Encounter (Signed)
 Have tried calling patient to give them update on where we were at with surgical clearance and why we had to cancel his surgery. ENT and Surgery have had no luck reaching out to patient. I did send him a message in [place].

## 2024-11-10 ENCOUNTER — Ambulatory Visit (HOSPITAL_BASED_OUTPATIENT_CLINIC_OR_DEPARTMENT_OTHER): Admission: RE | Admit: 2024-11-10 | Source: Home / Self Care

## 2024-11-10 SURGERY — DRUG INDUCED SLEEP ENDOSCOPY
Anesthesia: General

## 2024-11-23 ENCOUNTER — Encounter (HOSPITAL_BASED_OUTPATIENT_CLINIC_OR_DEPARTMENT_OTHER): Payer: Self-pay

## 2024-11-24 ENCOUNTER — Ambulatory Visit (HOSPITAL_BASED_OUTPATIENT_CLINIC_OR_DEPARTMENT_OTHER): Admitting: Family

## 2024-11-24 ENCOUNTER — Encounter (HOSPITAL_BASED_OUTPATIENT_CLINIC_OR_DEPARTMENT_OTHER): Payer: Self-pay | Admitting: Family

## 2024-11-24 VITALS — BP 111/76 | HR 76 | Ht 71.0 in | Wt 214.9 lb

## 2024-11-24 DIAGNOSIS — I4819 Other persistent atrial fibrillation: Secondary | ICD-10-CM | POA: Diagnosis not present

## 2024-11-24 DIAGNOSIS — I10 Essential (primary) hypertension: Secondary | ICD-10-CM

## 2024-11-24 DIAGNOSIS — I5032 Chronic diastolic (congestive) heart failure: Secondary | ICD-10-CM

## 2024-11-24 DIAGNOSIS — Z79899 Other long term (current) drug therapy: Secondary | ICD-10-CM

## 2024-11-24 MED ORDER — TORSEMIDE 20 MG PO TABS: 20.0000 mg | ORAL_TABLET | ORAL | 2 refills | Status: AC | PRN

## 2024-11-24 NOTE — Patient Instructions (Addendum)
 Medication Instructions:   Continue your current medications.    Labwork: Your physician recommends that you return for lab work today: renin-aldosterone, catecholamines, metanephrines   Testing/Procedures: Your physician has requested that you have a renal artery duplex. During this test, an ultrasound is used to evaluate blood flow to the kidneys. Allow one hour for this exam. Do not eat after midnight the day before and avoid carbonated beverages. Take your medications as you usually do.    Follow-Up: Follow up with Dr. Bensimhon due in January. CALL [phone] IN DECEMBER TO SCHEDULE THIS APPOINTMENT.   Please follow up in 2 months in [facility] with Dr. Raford, Reche Finder, NP or Allean Mink PharmD    Special Instructions:    Recommend reduce alcohol intake to 2 drinks per day.   Please bring your blood pressure cuff and pill bottles to your next office visit.   Tips to Measure your Blood Pressure Correctly    Here's what you can do to ensure a correct reading:  Don't drink a caffeinated beverage or smoke during the 30 minutes before the test.  Sit quietly for five minutes before the test begins.  During the measurement, sit in a chair with your feet on the floor and your arm supported so your elbow is at about heart level.  The inflatable part of the cuff should completely cover at least 80% of your upper arm, and the cuff should be placed on bare skin, not over a shirt.  Don't talk during the measurement.  Have your blood pressure measured twice, with a brief break in between. If the readings are different by 5 points or more, have it done a third time.   Blood pressure categories  Blood pressure category SYSTOLIC (upper number)  DIASTOLIC (lower number)  Normal Less than 120 mm Hg and Less than 80 mm Hg  Elevated 120-129 mm Hg and Less than 80 mm Hg  High blood pressure: Stage 1 hypertension 130-139 mm Hg or 80-89 mm Hg  High blood pressure: Stage 2  hypertension 140 mm Hg or higher or 90 mm Hg or higher  Hypertensive crisis (consult your doctor immediately) Higher than 180 mm Hg and/or Higher than 120 mm Hg  Source: American Heart Association and American Stroke Association. For more on getting your blood pressure under control, buy Controlling Your Blood Pressure, a Special Health Report from [facility].

## 2024-11-24 NOTE — Progress Notes (Signed)
 "  Advanced Hypertension Clinic Initial Assessment:    Date:  11/24/2024   ID:  Parker [name], DOB 11/15/1966, MRN [id number]  PCP:  Parker Bernardino MATSU, MD  Cardiologist:  None  Nephrologist:  Referring MD: Parker Bernardino MATSU, MD   CC: Hypertension  History of Present Illness:    Parker [name] is a 58 y.o. 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 Here to establish care in the [facility] Hypertension Clinic.   He is established with ENT for sleep apnea. Moderate OSA by sleep s tudy 04/2024. Did not tolerate CPAP. Plans for hypoglossal nerve stimulation candidacy.   Follows with [facility]. Last visit 07/30/24 he was euvolemic and recommended to continue Torsemide  20 mg as needed, Entresto  97-23 mg twice daily, spironolactone  25 mg daily, Jardiance  10 mg daily, Toprol  50 mg daily continued. PYP study 09/2024 with no amyloidosis.  Amiodarone  dose previously reduced due to QT prolongation.  Parker [name] was diagnosed with hypertension at 58 years old. It has been difficult to control. Blood pressure checked with arm cuff at home. Readings have been high at home and variable from 109 to 190s, has not been checked for accuracy. Heart rate at home 65-78 bpm. he reports tobacco use never. Alcohol use daily with 2-4 drinks per day. Exercise limited by neuropathy for which he has been referred by PCP to pain management. He has been trying to stay active, previously enjoyed body building. Has had successful weight loss through dietary changes. he eats at home predominantly focusing on baked foods and lean proteins like chicken or fish and does follow low sodium diet. Does not take anything over the counter. He is hesitant regarding medications though reports taking mostly consistently only occasionally forgetting doses.    Dispense report Amlodipine  10 mg 10/09/24 90 supply Potassium 10 mEq 10/09/24 90 day supply Spironolactone  25 mg daily 10/09/24 90 day supply No reported dispenses of Jardiance , Entresto , Torsemide   Past Medical History:  Diagnosis Date   (HFpEF) heart failure with preserved ejection fraction (HCC)    Acute on chronic heart failure (HCC) 05/16/2022   AKI (acute kidney injury) 01/23/2023   Lab Results  Component  Value  Date     CREATININE  0.97  03/03/2023     CREATININE  1.14  02/19/2023     CREATININE  1.29 (H)  02/18/2023         Arthritis    knees. elbow, ankle   Cataract    CHF (congestive heart failure) (HCC)    Diabetes mellitus without complication (HCC)    Diastolic CHF (HCC) 02/14/2023   Dyspnea    when in A-fib   Dysrhythmia    A-fib   GERD (gastroesophageal reflux disease)    takes Dexilant prn   History of COVID-19 05/18/2021   Formatting of this note might be different from the original. Not requiring hospitalization Formatting of this note might be different from the original. Not requiring hospitalization   Hypertension    takes Lisinopril -HCTZ daily   Hypothyroidism    Kidney injury 03/07/2023   Chronic kidney disease, stage 3a (ICD-10: N18.31): GFR 58.20 mL/min on 07/09/2023 indicates mild kidney dysfunction. Monitor closely and adjust medications as needed.            Lab Results      Component    Value  Date/Time           CREATININE    1.35    07/09/2023 02:47 PM           CREATININE    1.20    03/12/2023 12:10 PM           CREATININE    0.97    03/03/2023 02:39 PM           CREATI   Localized swelling of left lower extremity 01/11/2020   Localized swelling of right lower extremity 04/24/2020   Muscle spasm    takes Relafen  daily   OSA on CPAP 03/07/2023   does not use CPAP   Peripheral edema    Plantar fasciitis    right   Situational insomnia 01/06/2018   Sleep apnea    Sprain of deltoid ligament of left ankle 08/10/2019   Torn meniscus    right  knee    Past Surgical History:  Procedure Laterality Date   CARDIOVERSION N/A 02/19/2023   Procedure: CARDIOVERSION;  Surgeon: Parker Toribio SAUNDERS, MD;  Location: [facility] ENDOSCOPY;  Service: Cardiovascular;  Laterality: N/A;   EAR CYST EXCISION N/A 08/06/2013   Procedure: CYST REMOVAL;  Surgeon: Parker Kraft, MD;  Location: [facility] OR;  Service: ENT;  Laterality: N/A;   ESOPHAGOGASTRODUODENOSCOPY     left knee arthroscopy  2003   PAROTIDECTOMY Right 08/06/2013   Procedure: PAROTIDECTOMY;  Surgeon: Parker Kraft, MD;  Location[facility] OR;  Service: ENT;  Laterality: Right;   RIGHT HEART CATH N/A 02/17/2023   Procedure: RIGHT HEART CATH;  Surgeon: Parker Toribio SAUNDERS, MD;  Location: [facility] INVASIVE CV LAB;  Service: Cardiovascular;  Laterality: N/A;   SYNOVECTOMY Left 01/26/2024   Procedure: EXTENSOR TENOSYNOVECTOMY LEFT WRIST;  Surgeon: Parker Drivers, MD;  Location: [facility];  Service: Orthopedics;  Laterality: Left;  Regional block   TEE WITHOUT CARDIOVERSION N/A 02/19/2023   Procedure: TRANSESOPHAGEAL ECHOCARDIOGRAM (TEE);  Surgeon: Parker Toribio SAUNDERS, MD;  Location: [facility] ENDOSCOPY;  Service: Cardiovascular;  Laterality: N/A;   TONSILLECTOMY Bilateral 08/06/2013   Procedure: TONSILLECTOMY;  Surgeon: Parker Kraft, MD;  Location: [facility] OR;  Service: ENT;  Laterality: Bilateral;    Current Medications: Active Medications[1]   Allergies:   Iodinated contrast media and Other   Social History   Socioeconomic History   Marital status: Single    Spouse name: Not on file   Number of children: Not on file   Years of education: Not on file   Highest education level: Some college, no degree  Occupational History   Not on file  Tobacco Use   Smoking status: Never    Passive exposure: Past   Smokeless tobacco: Never  Vaping Use   Vaping status: Never Used  Substance and Sexual Activity   Alcohol use: Yes    Alcohol/week: 6.0 standard drinks of alcohol    Types: 6 Standard drinks or  equivalent per week    Comment: Occasional Drink   Drug use: No   Sexual activity: Yes  Other Topics Concern   Not on file  Social History Narrative   Are you right handed or left handed? Right Handed   Are you currently employed ? No    What is your current occupation?   Do you live at home alone? Yes    Who lives with you?    What type of home do you live in: 1 story or 2 story? Lives in a one story home  Social [name]   Tobacco Use: Low Risk (11/24/2024)   Patient History    Smoking Tobacco Use: Never    Smokeless Tobacco Use: Never    Passive Exposure: Past  Financial Resource Strain: Low Risk (02/02/2024)   Overall Financial Resource Strain (CARDIA)    Difficulty of Paying Living Expenses: Not very hard  Food Insecurity: Food Insecurity Present (02/02/2024)   Hunger Vital Sign    Worried About Running Out of Food in the Last Year: Sometimes true    Ran Out of Food in the Last Year: Sometimes true  Transportation Needs: No Transportation Needs (02/02/2024)   PRAPARE - Administrator, Civil Service (Medical): No    Lack of Transportation (Non-Medical): No  Physical Activity: Sufficiently Active (02/02/2024)   Exercise Vital Sign    Days of Exercise per Week: 4 days    Minutes of Exercise per Session: 50 min  Stress: Stress Concern Present (02/02/2024)   [employer] of Occupational Health - Occupational Stress Questionnaire    Feeling of Stress : To some extent  Social Connections: Socially Isolated (02/02/2024)   Social Connection and Isolation Panel    Frequency of Communication with Friends and Family: More than three times a week    Frequency of Social Gatherings with Friends and Family: Three times a week    Attends Religious Services: Never    Active Member of Clubs or Organizations: No    Attends Banker Meetings: Never    Marital Status: Never married  Depression (PHQ2-9): Low Risk (06/09/2024)   Depression (PHQ2-9)    PHQ-2  Score: 0  Alcohol Screen: Medium Risk (04/04/2023)   Alcohol Screen    Last Alcohol Screening Score (AUDIT): 10  Housing: Unknown (02/02/2024)   Housing Stability Vital Sign    Unable to Pay for Housing in the Last Year: No    Number of Times Moved in the Last Year: Not on file    Homeless in the Last Year: No  Utilities: Not At Risk (02/02/2024)   AHC Utilities    Threatened with loss of utilities: No  Health Literacy: Adequate Health Literacy (02/02/2024)   B1300 Health Literacy    Frequency of need for help with medical instructions: Never     Family History: The patient's family history includes Diabetes in his father; Heart Problems in his mother.  ROS:   Please see the history of present illness.     All other systems reviewed and are negative.  EKGs/Labs/Other Studies Reviewed:         Recent Labs: 12/15/2023: Magnesium  1.4 04/30/2024: TSH 8.089 06/14/2024: ALT 59 09/12/2024: B Natriuretic Peptide 16.9; BUN <5; Creatinine, Ser 0.78; Hemoglobin 15.5; Platelets 234; Potassium 3.7; Sodium 141   Recent Lipid Panel    Component Value Date/Time   CHOL 202 (H) 08/11/2023 1422   TRIG 183.0 (H) 08/11/2023 1422   HDL 55.20 08/11/2023 1422   CHOLHDL 4 08/11/2023 1422   VLDL 36.6 08/11/2023 1422   LDLCALC 110 (H) 08/11/2023 1422   LDLDIRECT 144.0 07/09/2023 1447    Physical Exam:   VS:  BP 111/76 (BP Location: Left Arm, Patient Position: Sitting, Cuff Size: Normal)   Pulse 76   Ht 5' 11 (1.803 m)   Wt 214 lb 14.4 oz (97.5 kg)   SpO2 98%   BMI 29.97 kg/m  , BMI Body mass index is 29.97 kg/m.  Vitals:   11/24/24 0807 11/24/24 0809  BP: 96/68 111/76  Pulse:  80 76  Height: 5' 11 (1.803 m)   Weight: 214 lb 14.4 oz (97.5 kg)   SpO2: 98%   BMI (Calculated): 29.99     GENERAL:  Well appearing HEENT: Pupils equal round and reactive, fundi not visualized, oral mucosa unremarkable NECK:  No jugular venous distention, waveform within normal limits, carotid upstroke brisk and  symmetric, no bruits, no thyromegaly LYMPHATICS:  No cervical adenopathy LUNGS:  Clear to auscultation bilaterally HEART:  RRR.  PMI not displaced or sustained,S1 and S2 within normal limits, no S3, no S4, no clicks, no rubs, no murmurs ABD:  Flat, positive bowel sounds normal in frequency in pitch, no bruits, no rebound, no guarding, no midline pulsatile mass, no hepatomegaly, no splenomegaly EXT:  2 plus pulses throughout, no edema, no cyanosis no clubbing SKIN:  No rashes no nodules NEURO:  Cranial nerves II through XII grossly intact, motor grossly intact throughout PSYCH:  Cognitively intact, oriented to person place and time   ASSESSMENT/PLAN:    HTN - BP at goal today though never in prior clinic visits. Nonadherence suspected. Per dispense report in Epic has sufficient Amlodipine , Potassium, Spironolactone  but no reported dispenses of Jardiance , Entresto , Torsemide . He was asked to bring pill bottles as well as BP cuff to next OV.  Continue present antihypertensive regimen amlodipine  10 mg daily, Toprol  50 mg daily, Entresto  97-103 mg twice daily, spironolactone  25 mg daily.  If BP persistently difficult to control in the future could consider increasing dose of spironolactone . Discussed to reduce etoh intake to 2 standard drinks per day, declines to reduce.  Secondary hypertension workup:  labs today renin-aldosterone, catecholamines, metanephrines. If aldosterone greater than 10 will require him to hold spironolactone  48 hours prior to repeat plasma renin aldosterone ratio. Renal artery duplex ordered to rule out renal artery stenosis.  HF -euvolemic on exam.  Not requiring as needed torsemide .  Reports improvement in edema since weight loss. Due for follow up with ADV HF clinic.  He was provided their phone number to call and schedule.  OSA / Preop - moderate OSA by prior sleep study. Did not tolerate CPAP. Working with ENT for possible Inspire device. Per AHA/ACC guidelines, he is  deemed acceptable risk for drug induced sleep endoscopy without additional cardiovascular testing. Will route to surgical team so they are aware.  Low suspicion will need to hold Xarelto  for this procedure, will route to PharmD for review.   Screening for Secondary Hypertension:     Relevant Labs/Studies:    Latest Ref Rng & Units 09/12/2024   10:17 PM 06/14/2024    3:36 PM 04/30/2024   10:54 AM  Basic Labs  Sodium 135 - 145 mmol/L 141  139  140   Potassium 3.5 - 5.1 mmol/L 3.7  4.3  3.4   Creatinine 0.61 - 1.24 mg/dL 9.21  8.99  9.15        Latest Ref Rng & Units 04/30/2024   10:54 AM 01/20/2024   12:42 PM  Thyroid    TSH 0.350 - 4.500 uIU/mL 8.089  9.186                 11/24/2024    8:36 AM  Renovascular   Renal Artery US  Completed Yes     he is interested in enrolling in the PREP exercise and nutrition program through the [facility].     Disposition:    FU with MD/APP/PharmD in 2 months    Medication Adjustments/Labs and Tests Ordered: Current medicines are reviewed  at length with the patient today.  Concerns regarding medicines are outlined above.  Orders Placed This Encounter  Procedures   Catecholamines, fractionated, plasma   Aldosterone + renin activity w/ ratio   Metanephrines, plasma   Hepatic function panel   Amb Referral To Provider Referral Exercise Program (P.R.E.P)   VAS US  RENAL ARTERY DUPLEX   Meds ordered this encounter  Medications   torsemide  (DEMADEX ) 20 MG tablet    Sig: Take 1 tablet (20 mg total) by mouth as needed (For swelling or fluid retention.).    Dispense:  30 tablet    Refill:  2    Supervising Provider:   LONNI SLAIN [[id number]]     Signed, Reche GORMAN Finder, NP  11/24/2024 11:47 AM    [facility]     [1]  Current Meds  Medication Sig   allopurinol  (ZYLOPRIM ) 300 MG tablet Take 1 tablet (300 mg total) by mouth daily.   amiodarone  (PACERONE ) 200 MG tablet Take 100 mg by mouth daily.    amLODipine  (NORVASC ) 10 MG tablet Take 1 tablet (10 mg total) by mouth daily.   Ascorbic Acid (VITAMIN C PO) Take 1 tablet by mouth daily.   augmented betamethasone dipropionate (DIPROLENE-AF) 0.05 % cream Apply topically 2 (two) times daily as needed.   Cholecalciferol (VITAMIN D3 PO) Take 1 tablet by mouth daily.   colchicine  0.6 MG tablet Take 1/2 tablet by mouth daily or increase to 1 tablet for up to 5 days for flare   Cyanocobalamin  (B-12) 1000 MCG SUBL Place 1 tablet under the tongue daily at 6 (six) AM.   cyclobenzaprine  (FLEXERIL ) 10 MG tablet Take 1-2 tablets (10-20 mg total) by mouth 3 (three) times daily as needed for muscle spasms.   empagliflozin  (JARDIANCE ) 10 MG TABS tablet Take 1 tablet (10 mg total) by mouth daily.   fluticasone  (FLONASE ) 50 MCG/ACT nasal spray Place 2 sprays into both nostrils daily.   folic acid  (FOLVITE ) 1 MG tablet Take 1 tablet (1 mg total) by mouth daily as needed (low iron).   HYDROcodone -acetaminophen  (NORCO/VICODIN) 5-325 MG tablet Take 1 tablet by mouth daily as needed.   levothyroxine  (SYNTHROID ) 50 MCG tablet Take 1 tablet (50 mcg total) by mouth daily. Needs to be adjusted every 3-6 months based on labwork if still taking amiodarone    Magnesium  Bisglycinate (MAG GLYCINATE) 100 MG TABS Take 1 tablet by mouth daily.   meclizine  (ANTIVERT ) 25 MG tablet Take 1 tablet (25 mg total) by mouth 3 (three) times daily as needed for dizziness.   metoprolol  succinate (TOPROL -XL) 50 MG 24 hr tablet Take 1 tablet (50 mg total) by mouth daily.   Multiple Vitamins-Minerals (MULTIVITAMIN WITH MINERALS) tablet Take 1 tablet by mouth daily.   naltrexone  (DEPADE) 50 MG tablet Take 0.5 tablets (25 mg total) by mouth daily.   potassium chloride  (KLOR-CON ) 10 MEQ tablet Take 2 tablets (20 mEq total) by mouth daily.   pregabalin (LYRICA) 50 MG capsule Take 50 mg by mouth.   rivaroxaban  (XARELTO ) 20 MG TABS tablet Take 1 tablet (20 mg total) by mouth daily with supper.    rosuvastatin  (CRESTOR ) 20 MG tablet Take 1 tablet (20 mg total) by mouth daily. Replaces 10 mg dose, can cause cramps   sacubitril -valsartan  (ENTRESTO ) 97-103 MG Take 1 tablet by mouth 2 (two) times daily.   spironolactone  (ALDACTONE ) 25 MG tablet Take 1 tablet (25 mg total) by mouth daily.   traMADol  (ULTRAM ) 50 MG tablet Take 1-2 tablets (  50-100 mg total) by mouth daily as needed.   traZODone  (DESYREL ) 50 MG tablet Take 25 mg by mouth at bedtime as needed.   [DISCONTINUED] torsemide  (DEMADEX ) 20 MG tablet TAKE 1 TABLET BY MOUTH EVERY DAY   "

## 2024-12-01 ENCOUNTER — Telehealth: Payer: Self-pay

## 2024-12-01 ENCOUNTER — Ambulatory Visit (HOSPITAL_BASED_OUTPATIENT_CLINIC_OR_DEPARTMENT_OTHER): Payer: Self-pay | Admitting: Family

## 2024-12-01 DIAGNOSIS — K760 Fatty (change of) liver, not elsewhere classified: Secondary | ICD-10-CM

## 2024-12-01 DIAGNOSIS — F1090 Alcohol use, unspecified, uncomplicated: Secondary | ICD-10-CM

## 2024-12-01 DIAGNOSIS — R748 Abnormal levels of other serum enzymes: Secondary | ICD-10-CM

## 2024-12-01 DIAGNOSIS — I10 Essential (primary) hypertension: Secondary | ICD-10-CM

## 2024-12-01 DIAGNOSIS — R7401 Elevation of levels of liver transaminase levels: Secondary | ICD-10-CM

## 2024-12-01 LAB — METANEPHRINES, PLASMA
Metanephrine, Free: 28.1 pg/mL (ref 0.0–88.0)
Normetanephrine, Free: 148.1 pg/mL (ref 0.0–244.0)

## 2024-12-01 LAB — CATECHOLAMINES, FRACTIONATED, PLASMA
Dopamine: 16.7 pg/mL (ref 0.0–36.7)
Epinephrine: 49.1 pg/mL (ref 0.0–55.4)
Norepinephrine: 866 pg/mL — ABNORMAL HIGH (ref 115–524)

## 2024-12-01 LAB — ALDOSTERONE + RENIN ACTIVITY W/ RATIO
Aldos/Renin Ratio: 2.3 (ref 0.0–30.0)
Aldosterone: 24.6 ng/dL (ref 0.0–30.0)
Renin Activity, Plasma: 10.811 ng/mL/h — ABNORMAL HIGH (ref 0.167–5.380)

## 2024-12-01 LAB — HEPATIC FUNCTION PANEL
ALT: 73 IU/L — ABNORMAL HIGH (ref 0–44)
AST: 185 IU/L — ABNORMAL HIGH (ref 0–40)
Albumin: 4.1 g/dL (ref 3.8–4.9)
Alkaline Phosphatase: 66 IU/L (ref 47–123)
Bilirubin Total: 0.6 mg/dL (ref 0.0–1.2)
Bilirubin, Direct: 0.35 mg/dL (ref 0.00–0.40)
Total Protein: 6.8 g/dL (ref 6.0–8.5)

## 2024-12-01 NOTE — Telephone Encounter (Signed)
-----   Message from Reche Finder, NP sent at 12/01/2024 12:21 PM EST ----- Renin aldosterone ratio with no evidence of hyperaldosteronism. Good result! Normal metanephrines. Mildly elevated catecholamines. Recommend 24 h urine catecholamines for further evaluation.   Elevated liver enzymes likely due to alcohol intake, recommend reducing to 2 drinks per day as discussed in clinic visit. Due to elevated liver enzymes, recommend avoiding Tylenol . Refer to  gastroenterology for further evaluation of transaminitis.   Provider note only: Called [facility]. He has not filled Amiodarone  nor Rosuvastatin  since 2024. As he has not taken in >1 year, these are not impacting his liver function. Will remove from medication list  and address at follow up. Nonadherence was suspected at his clinic visit.

## 2024-12-01 NOTE — Telephone Encounter (Signed)
 Call Adriana about the [facility] Prep Program no answers, his voice Mail was full. I sent a text to his phone in order for him to reach out to discus the program.

## 2024-12-01 NOTE — Telephone Encounter (Signed)
 Tried calling the pt back to endorse lab results and plan per Reche Finder, NP, and pt did not answer and mailbox is full at this time.   Results/plan was sent to the pts mychart to review by Reche Finder, NP.   Will go ahead and place an order for 24 hour urine catecholamines in the system as well as referral to GI.  Will also send the pt the 24 hour urine collection instructions to his mychart to refer to when collecting his 24 hr urine specimen.   Will monitor for pt to review results in mychart.

## 2024-12-06 NOTE — Progress Notes (Signed)
 PRATIK DALZIEL                                          MRN: [id number]   12/06/2024   The [facility] Quality Team Specialist reviewed this patient medical record for the purposes of chart review for care gap closure. The following were reviewed: abstraction for care gap closure-controlling blood pressure.    VBCI Quality Team

## 2024-12-07 ENCOUNTER — Encounter (HOSPITAL_BASED_OUTPATIENT_CLINIC_OR_DEPARTMENT_OTHER): Payer: Self-pay | Admitting: *Deleted

## 2024-12-13 ENCOUNTER — Telehealth: Payer: Self-pay

## 2024-12-13 NOTE — Telephone Encounter (Signed)
 Parker [name] is Looking to join @ [employer] for the prep class program. We spoke on the phone but he was in a meeting. So I text him to set-up an assessment Thursday 12/16/24 waiting for his to return text message.

## 2024-12-15 ENCOUNTER — Telehealth: Payer: Self-pay

## 2024-12-15 NOTE — Telephone Encounter (Signed)
 I text Parker [name] for Prep Class. I sent a time and date to meet to discuss Prep but no responds.

## 2024-12-22 ENCOUNTER — Ambulatory Visit (HOSPITAL_COMMUNITY)
Admission: RE | Admit: 2024-12-22 | Discharge: 2024-12-22 | Disposition: A | Discharge status: Home / Self Care | Source: Ambulatory Visit | Attending: Family | Admitting: Family

## 2024-12-22 DIAGNOSIS — I10 Essential (primary) hypertension: Secondary | ICD-10-CM | POA: Diagnosis not present

## 2024-12-27 ENCOUNTER — Telehealth (HOSPITAL_COMMUNITY): Payer: Self-pay

## 2024-12-27 NOTE — Telephone Encounter (Signed)
 Advanced Heart Failure Patient Advocate Encounter  The patient was approved for a [employer] grant that will help cover the cost of Entresto , Jardiance , Metoprolol , Spironolactone , Xarelto .  Total amount awarded, $7,500.  Effective: 11/27/2024 - 11/26/2025.  BIN [id number] PCN [id number] [id number] ID [id number]  Pharmacy provided with approval and processing information. Patient informed via MyChart.  Rachel DEL, CPhT Rx Patient Advocate Phone: [phone]

## 2024-12-31 ENCOUNTER — Encounter (HOSPITAL_BASED_OUTPATIENT_CLINIC_OR_DEPARTMENT_OTHER): Payer: Self-pay | Admitting: *Deleted

## 2025-01-05 NOTE — Progress Notes (Signed)
 TRAMPAS STETTNER                                          MRN: [id number]   01/05/2025   The VBCI Quality Team Specialist reviewed this patient medical record for the purposes of chart review for care gap closure. The following were reviewed: chart review for care gap closure-kidney health evaluation for diabetes:eGFR  and uACR.    VBCI Quality Team

## 2025-01-25 ENCOUNTER — Ambulatory Visit (HOSPITAL_BASED_OUTPATIENT_CLINIC_OR_DEPARTMENT_OTHER): Admitting: Family

## 2025-02-03 ENCOUNTER — Ambulatory Visit
